# Patient Record
Sex: Male | Born: 1945
Health system: Southern US, Community
[De-identification: ages and names within clinical notes are randomized; demographics above are authoritative.]

## PROBLEM LIST (undated history)

## (undated) DIAGNOSIS — E785 Hyperlipidemia, unspecified: Secondary | ICD-10-CM

## (undated) DIAGNOSIS — C439 Malignant melanoma of skin, unspecified: Secondary | ICD-10-CM

## (undated) DIAGNOSIS — C4491 Basal cell carcinoma of skin, unspecified: Secondary | ICD-10-CM

## (undated) DIAGNOSIS — J449 Chronic obstructive pulmonary disease, unspecified: Secondary | ICD-10-CM

## (undated) DIAGNOSIS — C4492 Squamous cell carcinoma of skin, unspecified: Secondary | ICD-10-CM

## (undated) DIAGNOSIS — I10 Essential (primary) hypertension: Secondary | ICD-10-CM

## (undated) DIAGNOSIS — I219 Acute myocardial infarction, unspecified: Secondary | ICD-10-CM

## (undated) HISTORY — DX: Basal cell carcinoma of skin, unspecified: C44.91

## (undated) HISTORY — DX: Malignant melanoma of skin, unspecified: C43.9

## (undated) HISTORY — DX: Chronic obstructive pulmonary disease, unspecified: J44.9

## (undated) HISTORY — DX: Squamous cell carcinoma of skin, unspecified: C44.92

## (undated) HISTORY — DX: Hyperlipidemia, unspecified: E78.5

## (undated) HISTORY — DX: Essential (primary) hypertension: I10

## (undated) HISTORY — DX: Acute myocardial infarction, unspecified: I21.9

---

## 1980-11-19 HISTORY — PX: MELANOMA EXCISION: SHX5266

## 2009-10-28 ENCOUNTER — Ambulatory Visit: Payer: Self-pay | Admitting: Family Medicine

## 2009-12-15 ENCOUNTER — Ambulatory Visit: Payer: Self-pay | Admitting: Pain Medicine

## 2009-12-29 ENCOUNTER — Ambulatory Visit: Payer: Self-pay | Admitting: Pain Medicine

## 2010-01-12 ENCOUNTER — Ambulatory Visit: Payer: Self-pay | Admitting: Pain Medicine

## 2010-02-02 ENCOUNTER — Ambulatory Visit: Payer: Self-pay | Admitting: Pain Medicine

## 2010-10-30 ENCOUNTER — Ambulatory Visit: Payer: Self-pay | Admitting: Ophthalmology

## 2010-11-27 ENCOUNTER — Ambulatory Visit: Payer: Self-pay | Admitting: Ophthalmology

## 2014-10-19 DIAGNOSIS — I219 Acute myocardial infarction, unspecified: Secondary | ICD-10-CM

## 2014-10-19 HISTORY — PX: CAROTID STENT: SHX1301

## 2014-10-19 HISTORY — DX: Acute myocardial infarction, unspecified: I21.9

## 2014-10-26 DIAGNOSIS — I252 Old myocardial infarction: Secondary | ICD-10-CM | POA: Insufficient documentation

## 2014-11-30 DIAGNOSIS — I2510 Atherosclerotic heart disease of native coronary artery without angina pectoris: Secondary | ICD-10-CM | POA: Insufficient documentation

## 2015-05-06 ENCOUNTER — Encounter: Payer: Self-pay | Admitting: Family Medicine

## 2015-05-06 ENCOUNTER — Other Ambulatory Visit: Payer: Self-pay | Admitting: Family Medicine

## 2015-05-06 ENCOUNTER — Ambulatory Visit (INDEPENDENT_AMBULATORY_CARE_PROVIDER_SITE_OTHER): Payer: Commercial Managed Care - HMO | Admitting: Family Medicine

## 2015-05-06 ENCOUNTER — Ambulatory Visit
Admission: RE | Admit: 2015-05-06 | Discharge: 2015-05-06 | Disposition: A | Discharge status: Home / Self Care | Payer: Commercial Managed Care - HMO | Source: Ambulatory Visit | Attending: Family Medicine | Admitting: Family Medicine

## 2015-05-06 VITALS — BP 104/65 | HR 52 | Temp 98.4°F | Resp 16 | Ht 71.0 in | Wt 205.0 lb

## 2015-05-06 DIAGNOSIS — I2510 Atherosclerotic heart disease of native coronary artery without angina pectoris: Secondary | ICD-10-CM

## 2015-05-06 DIAGNOSIS — J189 Pneumonia, unspecified organism: Secondary | ICD-10-CM | POA: Diagnosis not present

## 2015-05-06 DIAGNOSIS — R0602 Shortness of breath: Secondary | ICD-10-CM

## 2015-05-06 MED ORDER — AZITHROMYCIN 250 MG PO TABS: ORAL_TABLET | ORAL | Status: DC

## 2015-05-06 MED ORDER — ALBUTEROL SULFATE HFA 108 (90 BASE) MCG/ACT IN AERS: 2.0000 | INHALATION_SPRAY | RESPIRATORY_TRACT | Status: DC | PRN

## 2015-05-06 NOTE — Progress Notes (Signed)
Name: George Jensen   MRN: [id number]    DOB: Jan 19, 1946   Date:05/06/2015       Progress Note  Subjective  Chief Complaint  Chief Complaint  Patient presents with  . Breathing Problem    Stopped smoking December. Having SOB and wheezing since.   . Coronary Artery Disease    HPI   Here for SOB.  Question re: COPD.  S/p MI in Dec. Of 2015 with 3 stents placed.  Has sig. DOE and SOB with lyhig flat. No more chest pain.  Has stopped smoking x 6 mos.  Smoked for >50 yrs.  Past Medical History  Diagnosis Date  . Myocardial infarction 10/2014  . COPD (chronic obstructive pulmonary disease)   . Hypertension     Past Surgical History  Procedure Laterality Date  . Carotid stent  10/2014    3 STENTS [facility]    History reviewed. No pertinent family history.  History   Social History  . Marital Status: Married    Spouse Name: N/A  . Number of Children: N/A  . Years of Education: N/A   Occupational History  . Not on file.   Social History Main Topics  . Smoking status: Former Smoker -- 2.00 packs/day    Types: Cigarettes    Quit date: 10/19/2014  . Smokeless tobacco: Never Used  . Alcohol Use: No  . Drug Use: No  . Sexual Activity: Not on file   Other Topics Concern  . Not on file   Social History Narrative  . No narrative on file     Current outpatient prescriptions:  .  aspirin 81 MG EC tablet, , Disp: , Rfl:  .  atorvastatin (LIPITOR) 80 MG tablet, , Disp: , Rfl:  .  BRILINTA 90 MG TABS tablet, , Disp: , Rfl:  .  fluticasone (FLONASE) 50 MCG/ACT nasal spray, Place 2 sprays into both nostrils daily., Disp: , Rfl:  .  lisinopril (PRINIVIL,ZESTRIL) 2.5 MG tablet, , Disp: , Rfl:  .  metoprolol tartrate (LOPRESSOR) 25 MG tablet, , Disp: , Rfl:  .  nitroGLYCERIN (NITROSTAT) 0.4 MG SL tablet, Place 0.4 mg under the tongue every 5 (five) minutes as needed for chest pain. AS NEEDED, Disp: , Rfl:  .  albuterol (PROVENTIL HFA;VENTOLIN HFA) 108 (90 BASE) MCG/ACT  inhaler, Inhale 2 puffs into the lungs every 4 (four) hours as needed for wheezing or shortness of breath (or shortness of breath)., Disp: 1 Inhaler, Rfl: 12  Not on File   Review of Systems  Constitutional: Negative.  Negative for fever, chills and malaise/fatigue.  HENT: Negative.   Eyes: Negative.  Negative for blurred vision and double vision.  Respiratory: Positive for shortness of breath. Negative for cough and wheezing.   Cardiovascular: Positive for orthopnea and PND. Negative for chest pain, palpitations and leg swelling.  Gastrointestinal: Negative.  Negative for nausea, vomiting, abdominal pain and diarrhea.  Genitourinary: Negative.   Musculoskeletal: Negative.  Negative for myalgias and joint pain.  Skin: Negative.  Negative for rash.  Neurological: Negative for weakness and headaches.      Objective  Filed Vitals:   05/06/15 1125  BP: 104/65  Pulse: 52  Temp: 98.4 F (36.9 C)  Resp: 16  Height: 5\' 11"  (1.803 m)  Weight: 205 lb (92.987 kg)  SpO2: 98%    Physical Exam  Constitutional: He is oriented to person, place, and time and well-developed, well-nourished, and in no distress. No distress.  HENT:  Head: Atraumatic.  Eyes: EOM are normal. Pupils are equal, round, and reactive to light. No scleral icterus.  Neck: Normal range of motion. Neck supple. No thyromegaly present.  Cardiovascular: Regular rhythm, normal heart sounds and intact distal pulses.  Bradycardia present.  Exam reveals no gallop and no friction rub.   No murmur heard. Pulmonary/Chest: Effort normal and breath sounds normal. No respiratory distress. He has no wheezes. He exhibits no tenderness.  Abdominal: Soft. Bowel sounds are normal. He exhibits no mass. There is no tenderness.  Musculoskeletal: He exhibits no edema.  Lymphadenopathy:    He has no cervical adenopathy.  Neurological: He is alert and oriented to person, place, and time.  Vitals reviewed.        Assessment &  Plan  Problem List Items Addressed This Visit      Cardiovascular and Mediastinum   Coronary artery disease   Relevant Medications   aspirin 81 MG EC tablet   atorvastatin (LIPITOR) 80 MG tablet   lisinopril (PRINIVIL,ZESTRIL) 2.5 MG tablet   metoprolol tartrate (LOPRESSOR) 25 MG tablet   nitroGLYCERIN (NITROSTAT) 0.4 MG SL tablet     Other   Shortness of breath    Other Visit Diagnoses    SOB (shortness of breath)    -  Primary    Relevant Medications    albuterol (PROVENTIL HFA;VENTOLIN HFA) 108 (90 BASE) MCG/ACT inhaler    Other Relevant Orders    Pulse oximetry (single)    DG Chest 2 View    Ambulatory referral to Pulmonology       Meds ordered this encounter  Medications  . aspirin 81 MG EC tablet    Sig:   . atorvastatin (LIPITOR) 80 MG tablet    Sig:   . lisinopril (PRINIVIL,ZESTRIL) 2.5 MG tablet    Sig:   . metoprolol tartrate (LOPRESSOR) 25 MG tablet    Sig:   . BRILINTA 90 MG TABS tablet    Sig:   . fluticasone (FLONASE) 50 MCG/ACT nasal spray    Sig: Place 2 sprays into both nostrils daily.  . nitroGLYCERIN (NITROSTAT) 0.4 MG SL tablet    Sig: Place 0.4 mg under the tongue every 5 (five) minutes as needed for chest pain. AS NEEDED  . albuterol (PROVENTIL HFA;VENTOLIN HFA) 108 (90 BASE) MCG/ACT inhaler    Sig: Inhale 2 puffs into the lungs every 4 (four) hours as needed for wheezing or shortness of breath (or shortness of breath).    Dispense:  1 Inhaler    Refill:  12   1. SOB (shortness of breath)  - Pulse oximetry (single); Future  Refer to pulmonology.  2. Coronary artery disease involving native coronary artery of native heart without angina pectoris  Continue current meds.

## 2015-05-06 NOTE — Progress Notes (Signed)
Advised.JH  

## 2015-05-09 ENCOUNTER — Telehealth: Payer: Self-pay | Admitting: Family Medicine

## 2015-05-09 NOTE — Telephone Encounter (Signed)
Pt returned call. States she available the rest of the day for a call back.

## 2015-05-09 NOTE — Telephone Encounter (Signed)
LMTCB

## 2015-05-09 NOTE — Telephone Encounter (Signed)
Pt went to work today but his [place] send him home when they found out that he had pneumonia and send him home wife wanted to find out if he can work and advised him not to go to work and if they need work note I will be happy to write one but still can find out with Dr. Luan Pulling but wife understood and will not go to work Thanks [employer]

## 2015-05-09 NOTE — Telephone Encounter (Signed)
Spoke to wife who stated they had additional questions concerning the pt's xray results and his limitations.

## 2015-05-09 NOTE — Telephone Encounter (Signed)
No.  He should not go to work at all this week, until the pneumonia has had a chance to clear.  If breathing is better, he might be able to return to work next Mon.-jh

## 2015-05-20 ENCOUNTER — Ambulatory Visit (INDEPENDENT_AMBULATORY_CARE_PROVIDER_SITE_OTHER): Payer: Commercial Managed Care - HMO | Admitting: Family Medicine

## 2015-05-20 ENCOUNTER — Ambulatory Visit: Payer: Self-pay | Admitting: Family Medicine

## 2015-05-20 ENCOUNTER — Encounter: Payer: Self-pay | Admitting: Family Medicine

## 2015-05-20 VITALS — BP 106/67 | HR 56 | Resp 16 | Ht 71.0 in | Wt 206.0 lb

## 2015-05-20 DIAGNOSIS — J439 Emphysema, unspecified: Secondary | ICD-10-CM | POA: Insufficient documentation

## 2015-05-20 DIAGNOSIS — J189 Pneumonia, unspecified organism: Secondary | ICD-10-CM | POA: Diagnosis not present

## 2015-05-20 DIAGNOSIS — J181 Lobar pneumonia, unspecified organism: Secondary | ICD-10-CM

## 2015-05-20 DIAGNOSIS — J438 Other emphysema: Secondary | ICD-10-CM | POA: Diagnosis not present

## 2015-05-20 NOTE — Patient Instructions (Addendum)
May decreased use of Albuterol inhaler to as needed.  Continue all current meds.

## 2015-05-20 NOTE — Progress Notes (Signed)
Name: George Jensen   MRN: [id number]    DOB: 28-Nov-1945   Date:05/20/2015       Progress Note  Subjective  Chief Complaint  Chief Complaint  Patient presents with  . Breathing Problem    Follow Up    HPI  For follow up of COPD and pneumonia.  Finished Z-pak.  Breathing much better.  Back to normal. Taking all meds.  Using Albuterol inhaler 4 times a day.   Past Medical History  Diagnosis Date  . Myocardial infarction 10/2014  . COPD (chronic obstructive pulmonary disease)   . Hypertension     History  Substance Use Topics  . Smoking status: Former Smoker -- 2.00 packs/day    Types: Cigarettes    Quit date: 10/19/2014  . Smokeless tobacco: Never Used  . Alcohol Use: No     Current outpatient prescriptions:  .  albuterol (PROVENTIL HFA;VENTOLIN HFA) 108 (90 BASE) MCG/ACT inhaler, Inhale 2 puffs into the lungs every 4 (four) hours as needed for wheezing or shortness of breath (or shortness of breath)., Disp: 1 Inhaler, Rfl: 12 .  aspirin 81 MG EC tablet, , Disp: , Rfl:  .  atorvastatin (LIPITOR) 80 MG tablet, , Disp: , Rfl:  .  BRILINTA 90 MG TABS tablet, , Disp: , Rfl:  .  fluticasone (FLONASE) 50 MCG/ACT nasal spray, Place 2 sprays into both nostrils daily., Disp: , Rfl:  .  lisinopril (PRINIVIL,ZESTRIL) 2.5 MG tablet, , Disp: , Rfl:  .  metoprolol tartrate (LOPRESSOR) 25 MG tablet, , Disp: , Rfl:  .  nitroGLYCERIN (NITROSTAT) 0.4 MG SL tablet, Place 0.4 mg under the tongue every 5 (five) minutes as needed for chest pain. AS NEEDED, Disp: , Rfl:  .  azithromycin (ZITHROMAX) 250 MG tablet, Take 2 tabs onmday 1, then 1 tab daily on days 2-5. (Patient not taking: Reported on 05/20/2015), Disp: 6 tablet, Rfl: 0  No Known Allergies  Review of Systems  Constitutional: Negative.  Negative for fever, chills and malaise/fatigue.  HENT: Negative.  Negative for congestion and nosebleeds.   Eyes: Negative.  Negative for blurred vision and double vision.  Respiratory: Positive  for cough and sputum production. Negative for shortness of breath and wheezing.   Cardiovascular: Negative.  Negative for chest pain, palpitations, orthopnea and leg swelling.  Gastrointestinal: Negative for heartburn, nausea, vomiting, abdominal pain and diarrhea.  Genitourinary: Negative.  Negative for dysuria and frequency.  Musculoskeletal: Negative.  Negative for myalgias and joint pain.  Skin: Negative.  Negative for rash.  Neurological: Negative for weakness and headaches.      Objective  Filed Vitals:   05/20/15 0817  BP: 106/67  Pulse: 56  Resp: 16  Height: 5\' 11"  (1.803 m)  Weight: 206 lb (93.441 kg)  SpO2: 100%     Physical Exam  Constitutional: He is well-developed, well-nourished, and in no distress. No distress.  HENT:  Head: Normocephalic and atraumatic.  Left Ear: External ear normal.  Nose: Nose normal.  Mouth/Throat: Oropharynx is clear and moist.  Neck: Normal range of motion. Neck supple. No thyromegaly present.  Cardiovascular: Regular rhythm, normal heart sounds and intact distal pulses.  Bradycardia present.  Exam reveals no gallop.   No murmur heard. Pulmonary/Chest: Effort normal and breath sounds normal. No respiratory distress. He has no wheezes. He has no rales.  Musculoskeletal: He exhibits no edema.  Lymphadenopathy:    He has no cervical adenopathy.  Vitals reviewed.       Assessment &  Plan  1. Other emphysema   2. Right lower lobe pneumonia -get CXR in 2 weeks.

## 2015-06-03 ENCOUNTER — Ambulatory Visit
Admission: RE | Admit: 2015-06-03 | Payer: Commercial Managed Care - HMO | Source: Ambulatory Visit | Admitting: *Deleted

## 2015-07-14 ENCOUNTER — Ambulatory Visit (INDEPENDENT_AMBULATORY_CARE_PROVIDER_SITE_OTHER): Payer: Commercial Managed Care - HMO | Admitting: Family Medicine

## 2015-07-14 ENCOUNTER — Other Ambulatory Visit: Payer: Self-pay | Admitting: *Deleted

## 2015-07-14 ENCOUNTER — Encounter: Payer: Self-pay | Admitting: Family Medicine

## 2015-07-14 VITALS — BP 131/72 | HR 51 | Temp 97.9°F | Resp 16 | Ht 71.0 in | Wt 208.0 lb

## 2015-07-14 DIAGNOSIS — I129 Hypertensive chronic kidney disease with stage 1 through stage 4 chronic kidney disease, or unspecified chronic kidney disease: Secondary | ICD-10-CM | POA: Insufficient documentation

## 2015-07-14 DIAGNOSIS — I10 Essential (primary) hypertension: Secondary | ICD-10-CM | POA: Diagnosis not present

## 2015-07-14 DIAGNOSIS — J431 Panlobular emphysema: Secondary | ICD-10-CM

## 2015-07-14 DIAGNOSIS — R0602 Shortness of breath: Secondary | ICD-10-CM

## 2015-07-14 DIAGNOSIS — I25119 Atherosclerotic heart disease of native coronary artery with unspecified angina pectoris: Secondary | ICD-10-CM

## 2015-07-14 MED ORDER — ALBUTEROL SULFATE HFA 108 (90 BASE) MCG/ACT IN AERS: 2.0000 | INHALATION_SPRAY | RESPIRATORY_TRACT | Status: DC | PRN

## 2015-07-14 MED ORDER — BUDESONIDE-FORMOTEROL FUMARATE 160-4.5 MCG/ACT IN AERO: 2.0000 | INHALATION_SPRAY | Freq: Two times a day (BID) | RESPIRATORY_TRACT | Status: DC

## 2015-07-14 NOTE — Progress Notes (Signed)
Name: George Jensen   MRN: [id number]    DOB: [birth year]   Date:07/14/2015       Progress Note  Subjective  Chief Complaint  Chief Complaint  Patient presents with  . Follow-up    Pt saw Dr. Raul Del and was started on Spiriva 1 inhalation daily.   . Shortness of Breath    HPI  Here for f/u of COPD.  Still using rescue inhaler 2-4 times every day.  Dr. Raul Del put him on Spriva.  Still taking BP meds.  No heart pains No problem-specific assessment & plan notes found for this encounter.   Past Medical History  Diagnosis Date  . Myocardial infarction 10/2014  . COPD (chronic obstructive pulmonary disease)   . Hypertension     Social History  Substance Use Topics  . Smoking status: Former Smoker -- 2.00 packs/day    Types: Cigarettes    Quit date: 10/19/2014  . Smokeless tobacco: Never Used  . Alcohol Use: No     Current outpatient prescriptions:  .  albuterol (PROVENTIL HFA;VENTOLIN HFA) 108 (90 BASE) MCG/ACT inhaler, Inhale 2 puffs into the lungs every 4 (four) hours as needed for wheezing or shortness of breath (or shortness of breath)., Disp: 1 Inhaler, Rfl: 12 .  aspirin 81 MG EC tablet, , Disp: , Rfl:  .  atorvastatin (LIPITOR) 80 MG tablet, , Disp: , Rfl:  .  BRILINTA 90 MG TABS tablet, Take 90 mg by mouth 2 (two) times daily. , Disp: , Rfl:  .  fluticasone (FLONASE) 50 MCG/ACT nasal spray, Place 2 sprays into both nostrils as needed. , Disp: , Rfl:  .  lisinopril (PRINIVIL,ZESTRIL) 2.5 MG tablet, Take 2.5 mg by mouth daily. , Disp: , Rfl:  .  metoprolol tartrate (LOPRESSOR) 25 MG tablet, Take 25 mg by mouth 2 (two) times daily. , Disp: , Rfl:  .  nitroGLYCERIN (NITROSTAT) 0.4 MG SL tablet, Place 0.4 mg under the tongue every 5 (five) minutes as needed for chest pain. AS NEEDED, Disp: , Rfl:  .  tiotropium (SPIRIVA) 18 MCG inhalation capsule, Place 18 mcg into inhaler and inhale daily., Disp: , Rfl:  .  budesonide-formoterol (SYMBICORT) 160-4.5 MCG/ACT inhaler,  Inhale 2 puffs into the lungs 2 (two) times daily., Disp: 1 Inhaler, Rfl: 12  No Known Allergies  Review of Systems  Constitutional: Negative for fever, chills, weight loss and malaise/fatigue.  HENT: Negative for hearing loss.   Eyes: Negative for blurred vision and double vision.  Respiratory: Positive for cough and shortness of breath. Negative for sputum production and wheezing.   Cardiovascular: Negative for chest pain, palpitations, orthopnea and leg swelling.  Gastrointestinal: Negative for heartburn, nausea, vomiting, abdominal pain and diarrhea.  Genitourinary: Negative for dysuria, urgency and frequency.  Musculoskeletal: Negative for myalgias and joint pain.  Skin: Negative for rash.  Neurological: Negative for weakness and headaches.      Objective  Filed Vitals:   07/14/15 0957  BP: 131/72  Pulse: 51  Temp: 97.9 F (36.6 C)  TempSrc: Oral  Resp: 16  Height: 5\' 11"  (1.803 m)  Weight: 208 lb (94.348 kg)     Physical Exam  Constitutional: He is well-developed, well-nourished, and in no distress.  HENT:  Head: Normocephalic and atraumatic.  Eyes: Conjunctivae and EOM are normal. Pupils are equal, round, and reactive to light. No scleral icterus.  Neck: Normal range of motion. Neck supple. Carotid bruit is not present. No thyromegaly present.  Cardiovascular: Normal rate, regular rhythm,  normal heart sounds and intact distal pulses.  Exam reveals no gallop and no friction rub.   No murmur heard. Pulmonary/Chest: Effort normal and breath sounds normal. No respiratory distress. He has no wheezes. He has no rales.  Abdominal: Soft. Bowel sounds are normal.  Musculoskeletal: He exhibits no edema.  Lymphadenopathy:    He has no cervical adenopathy.  Vitals reviewed.     No results found for this or any previous visit (from the past 2160 hour(s)).   Assessment & Plan  1. Panlobular emphysema  - budesonide-formoterol (SYMBICORT) 160-4.5 MCG/ACT inhaler;  Inhale 2 puffs into the lungs 2 (two) times daily.  Dispense: 1 Inhaler; Refill: 12  2. Essential hypertension   3. Coronary artery disease with unspecified angina pectoris

## 2015-07-14 NOTE — Patient Instructions (Signed)
Continue current meds.    Recommend that he he contact Dr. Gust Brooms office for earlier appt. For f/u.

## 2015-07-19 ENCOUNTER — Other Ambulatory Visit: Payer: Self-pay | Admitting: Family Medicine

## 2015-07-19 DIAGNOSIS — R0602 Shortness of breath: Secondary | ICD-10-CM

## 2015-07-19 MED ORDER — ALBUTEROL SULFATE HFA 108 (90 BASE) MCG/ACT IN AERS: 2.0000 | INHALATION_SPRAY | RESPIRATORY_TRACT | Status: AC | PRN

## 2015-07-31 ENCOUNTER — Other Ambulatory Visit: Payer: Self-pay | Admitting: Family Medicine

## 2015-08-12 ENCOUNTER — Other Ambulatory Visit: Payer: Self-pay | Admitting: Family Medicine

## 2015-08-12 ENCOUNTER — Ambulatory Visit: Payer: Commercial Managed Care - HMO

## 2015-08-12 DIAGNOSIS — Z23 Encounter for immunization: Secondary | ICD-10-CM

## 2015-09-16 ENCOUNTER — Telehealth: Payer: Self-pay | Admitting: Family Medicine

## 2015-09-16 ENCOUNTER — Encounter: Payer: Self-pay | Admitting: Family Medicine

## 2015-09-16 ENCOUNTER — Ambulatory Visit (INDEPENDENT_AMBULATORY_CARE_PROVIDER_SITE_OTHER): Payer: Commercial Managed Care - HMO | Admitting: Family Medicine

## 2015-09-16 VITALS — BP 108/68 | HR 64 | Resp 16 | Ht 71.0 in | Wt 208.8 lb

## 2015-09-16 DIAGNOSIS — I25119 Atherosclerotic heart disease of native coronary artery with unspecified angina pectoris: Secondary | ICD-10-CM

## 2015-09-16 DIAGNOSIS — R5382 Chronic fatigue, unspecified: Secondary | ICD-10-CM

## 2015-09-16 DIAGNOSIS — J431 Panlobular emphysema: Secondary | ICD-10-CM | POA: Diagnosis not present

## 2015-09-16 DIAGNOSIS — I10 Essential (primary) hypertension: Secondary | ICD-10-CM

## 2015-09-16 NOTE — Patient Instructions (Signed)
Keep appt. With Dr. Newman Pies (Card) in [place]  In Dec.

## 2015-09-16 NOTE — Progress Notes (Signed)
Name: George Jensen   MRN: [id number]    DOB: [birth year]   Date:09/16/2015       Progress Note  Subjective  Chief Complaint  Chief Complaint  Patient presents with  . COPD  . Hypertension    HPI Here for f/u COPD and HBP.  C/o feeling fatigued all the time.  Had Coronary 12.2015.  Dr. Newman Pies ([place]).  No problem-specific assessment & plan notes found for this encounter.   Past Medical History  Diagnosis Date  . Myocardial infarction ([place]) 10/2014  . COPD (chronic obstructive pulmonary disease) ([place])   . Hypertension     Social History  Substance Use Topics  . Smoking status: Former Smoker -- 2.00 packs/day    Types: Cigarettes    Quit date: 10/19/2014  . Smokeless tobacco: Never Used  . Alcohol Use: No     Current outpatient prescriptions:  .  albuterol (PROVENTIL HFA;VENTOLIN HFA) 108 (90 BASE) MCG/ACT inhaler, Inhale 2 puffs into the lungs every 4 (four) hours as needed for wheezing or shortness of breath (or shortness of breath)., Disp: 3 Inhaler, Rfl: 3 .  aspirin 81 MG EC tablet, TAKE 1 TABLET EVERY DAY, Disp: 100 tablet, Rfl: 3 .  atorvastatin (LIPITOR) 80 MG tablet, , Disp: , Rfl:  .  BRILINTA 90 MG TABS tablet, Take 90 mg by mouth 2 (two) times daily. , Disp: , Rfl:  .  budesonide-formoterol (SYMBICORT) 160-4.5 MCG/ACT inhaler, Inhale 2 puffs into the lungs 2 (two) times daily., Disp: 1 Inhaler, Rfl: 12 .  fluticasone (FLONASE) 50 MCG/ACT nasal spray, Place 2 sprays into both nostrils as needed. , Disp: , Rfl:  .  lisinopril (PRINIVIL,ZESTRIL) 2.5 MG tablet, Take 2.5 mg by mouth daily. , Disp: , Rfl:  .  metoprolol tartrate (LOPRESSOR) 25 MG tablet, Take 25 mg by mouth 2 (two) times daily. , Disp: , Rfl:  .  nitroGLYCERIN (NITROSTAT) 0.4 MG SL tablet, Place 0.4 mg under the tongue every 5 (five) minutes as needed for chest pain. AS NEEDED, Disp: , Rfl:  .  tiotropium (SPIRIVA) 18 MCG inhalation capsule, Place 18 mcg into inhaler and inhale daily., Disp: , Rfl:    No Known Allergies  Review of Systems  Constitutional: Positive for malaise/fatigue. Negative for fever, chills and weight loss.  HENT: Negative for hearing loss.   Eyes: Negative for blurred vision and double vision.  Respiratory: Negative for cough, sputum production, shortness of breath and wheezing.   Cardiovascular: Negative for chest pain, palpitations and leg swelling.  Gastrointestinal: Negative for heartburn, abdominal pain and blood in stool.  Genitourinary: Negative for dysuria, urgency and frequency.  Musculoskeletal: Negative for myalgias and joint pain.  Skin: Negative for rash.  Neurological: Negative for dizziness, tremors, weakness and headaches.      Objective  Filed Vitals:   09/16/15 0940  BP: 108/68  Pulse: 64  Resp: 16  Height: 5\' 11"  (1.803 m)  Weight: 208 lb 12.8 oz (94.711 kg)     Physical Exam  Constitutional: He is oriented to person, place, and time and well-developed, well-nourished, and in no distress. No distress.  HENT:  Head: Normocephalic and atraumatic.  Eyes: Conjunctivae and EOM are normal. Pupils are equal, round, and reactive to light. No scleral icterus.  Neck: Normal range of motion. Neck supple. Carotid bruit is not present. No thyromegaly present.  Cardiovascular: Normal rate, regular rhythm, normal heart sounds and intact distal pulses.  Exam reveals no gallop and no friction rub.  No murmur heard. Pulmonary/Chest: Effort normal and breath sounds normal. No respiratory distress. He has no wheezes. He has no rales.  Abdominal: Soft. Bowel sounds are normal. He exhibits no distension and no abdominal bruit. There is no tenderness. There is no rebound.  Musculoskeletal: He exhibits no edema.  Lymphadenopathy:    He has no cervical adenopathy.  Neurological: He is alert and oriented to person, place, and time.  Vitals reviewed.     No results found for this or any previous visit (from the past 2160 hour(s)).   Assessment  & Plan  1. Essential hypertension -cont. meds - Comprehensive Metabolic Panel (CMET)  2. Panlobular emphysema (HCC) -=cont. meds  3. Coronary artery disease with unspecified angina pectoris -cont. meds - Lipid Profile  4. Chronic fatigue  - CBC with Differential - TSH

## 2015-09-16 NOTE — Telephone Encounter (Signed)
Ok-jh 

## 2015-09-16 NOTE — Telephone Encounter (Signed)
FYI

## 2015-09-16 NOTE — Telephone Encounter (Signed)
Pt's appt with cardio, Dr. Newman Pies is scheduled for May 2017 and then he is on recall.Marland Kitchen

## 2015-09-20 LAB — CBC WITH DIFFERENTIAL/PLATELET
BASOS ABS: 0 10*3/uL (ref 0.0–0.2)
Basos: 0 %
EOS (ABSOLUTE): 0.7 10*3/uL — ABNORMAL HIGH (ref 0.0–0.4)
EOS: 8 %
Hematocrit: 36.6 % — ABNORMAL LOW (ref 37.5–51.0)
Hemoglobin: 12.5 g/dL — ABNORMAL LOW (ref 12.6–17.7)
IMMATURE GRANULOCYTES: 0 %
Immature Grans (Abs): 0 10*3/uL (ref 0.0–0.1)
Lymphocytes Absolute: 1.3 10*3/uL (ref 0.7–3.1)
Lymphs: 15 %
MCH: 30.2 pg (ref 26.6–33.0)
MCHC: 34.2 g/dL (ref 31.5–35.7)
MCV: 88 fL (ref 79–97)
Monocytes Absolute: 1.1 10*3/uL — ABNORMAL HIGH (ref 0.1–0.9)
Monocytes: 13 %
NEUTROS PCT: 64 %
Neutrophils Absolute: 5.5 10*3/uL (ref 1.4–7.0)
PLATELETS: 210 10*3/uL (ref 150–379)
RBC: 4.14 x10E6/uL (ref 4.14–5.80)
RDW: 14 % (ref 12.3–15.4)
WBC: 8.7 10*3/uL (ref 3.4–10.8)

## 2015-09-21 LAB — COMPREHENSIVE METABOLIC PANEL
ALT: 24 IU/L (ref 0–44)
AST: 25 IU/L (ref 0–40)
Albumin/Globulin Ratio: 1.5 (ref 1.1–2.5)
Albumin: 3.9 g/dL (ref 3.6–4.8)
Alkaline Phosphatase: 97 IU/L (ref 39–117)
BUN/Creatinine Ratio: 11 (ref 10–22)
BUN: 19 mg/dL (ref 8–27)
Bilirubin Total: 0.4 mg/dL (ref 0.0–1.2)
CALCIUM: 9.1 mg/dL (ref 8.6–10.2)
CO2: 20 mmol/L (ref 18–29)
CREATININE: 1.7 mg/dL — AB (ref 0.76–1.27)
Chloride: 104 mmol/L (ref 97–106)
GFR calc Af Amer: 48 mL/min/{1.73_m2} — ABNORMAL LOW (ref >59)
GFR, EST NON AFRICAN AMERICAN: 41 mL/min/{1.73_m2} — AB (ref >59)
GLUCOSE: 103 mg/dL — AB (ref 65–99)
Globulin, Total: 2.6 g/dL (ref 1.5–4.5)
Potassium: 5.1 mmol/L (ref 3.5–5.2)
SODIUM: 140 mmol/L (ref 136–144)
Total Protein: 6.5 g/dL (ref 6.0–8.5)

## 2015-09-21 LAB — LIPID PANEL
CHOLESTEROL TOTAL: 132 mg/dL (ref 100–199)
Chol/HDL Ratio: 3.5 ratio units (ref 0.0–5.0)
HDL: 38 mg/dL — AB (ref >39)
LDL Calculated: 77 mg/dL (ref 0–99)
TRIGLYCERIDES: 86 mg/dL (ref 0–149)
VLDL Cholesterol Cal: 17 mg/dL (ref 5–40)

## 2015-09-21 LAB — TSH: TSH: 2.04 u[IU]/mL (ref 0.450–4.500)

## 2015-09-22 LAB — SPECIMEN STATUS REPORT

## 2015-09-22 LAB — HGB A1C W/O EAG: Hgb A1c MFr Bld: 6.3 % — ABNORMAL HIGH (ref 4.8–5.6)

## 2015-12-22 ENCOUNTER — Encounter: Payer: Self-pay | Admitting: Family Medicine

## 2015-12-22 ENCOUNTER — Ambulatory Visit (INDEPENDENT_AMBULATORY_CARE_PROVIDER_SITE_OTHER): Payer: Commercial Managed Care - HMO | Admitting: Family Medicine

## 2015-12-22 VITALS — BP 126/78 | HR 53 | Temp 97.4°F | Resp 16 | Ht 71.0 in | Wt 213.6 lb

## 2015-12-22 DIAGNOSIS — F32A Depression, unspecified: Secondary | ICD-10-CM

## 2015-12-22 DIAGNOSIS — R7303 Prediabetes: Secondary | ICD-10-CM | POA: Diagnosis not present

## 2015-12-22 DIAGNOSIS — J431 Panlobular emphysema: Secondary | ICD-10-CM | POA: Diagnosis not present

## 2015-12-22 DIAGNOSIS — I2510 Atherosclerotic heart disease of native coronary artery without angina pectoris: Secondary | ICD-10-CM | POA: Diagnosis not present

## 2015-12-22 DIAGNOSIS — F329 Major depressive disorder, single episode, unspecified: Secondary | ICD-10-CM | POA: Diagnosis not present

## 2015-12-22 DIAGNOSIS — I10 Essential (primary) hypertension: Secondary | ICD-10-CM | POA: Diagnosis not present

## 2015-12-22 DIAGNOSIS — F325 Major depressive disorder, single episode, in full remission: Secondary | ICD-10-CM | POA: Insufficient documentation

## 2015-12-22 DIAGNOSIS — E1169 Type 2 diabetes mellitus with other specified complication: Secondary | ICD-10-CM | POA: Insufficient documentation

## 2015-12-22 MED ORDER — LOSARTAN POTASSIUM 25 MG PO TABS: 25.0000 mg | ORAL_TABLET | Freq: Every day | ORAL | Status: DC

## 2015-12-22 MED ORDER — SERTRALINE HCL 50 MG PO TABS: 50.0000 mg | ORAL_TABLET | Freq: Every day | ORAL | Status: DC

## 2015-12-22 NOTE — Patient Instructions (Signed)
Check A1c on return

## 2015-12-22 NOTE — Progress Notes (Signed)
Name: George Jensen   MRN: [id number]    DOB: 1946/08/15   Date:12/22/2015       Progress Note  Subjective  Chief Complaint  Chief Complaint  Patient presents with  . Hypertension    HPI Here for f/u of HBP.  Has card disease but is improving.  Sees Card in [facility]).  He is feeling ok physically. But c/o "stress" .  Not sleeping well,  Feels tired, motivation getting, feels more sad and is more agitated, appetite comes and goes.  He has also been found to be pre-diabetic with A1c of 6.3. No problem-specific assessment & plan notes found for this encounter.   Past Medical History  Diagnosis Date  . Myocardial infarction ([place]) 10/2014  . COPD (chronic obstructive pulmonary disease) ([place])   . Hypertension     Past Surgical History  Procedure Laterality Date  . Carotid stent  10/2014    3 ST[facility]    History reviewed. No pertinent family history.  Social History   Social History  . Marital Status: Married    Spouse Name: N/A  . Number of Children: N/A  . Years of Education: N/A   Occupational History  . Not on file.   Social History Main Topics  . Smoking status: Former Smoker -- 2.00 packs/day    Types: Cigarettes    Quit date: 10/19/2014  . Smokeless tobacco: Never Used  . Alcohol Use: No  . Drug Use: No  . Sexual Activity: Not on file   Other Topics Concern  . Not on file   Social History Narrative     Current outpatient prescriptions:  .  albuterol (PROVENTIL HFA;VENTOLIN HFA) 108 (90 BASE) MCG/ACT inhaler, Inhale 2 puffs into the lungs every 4 (four) hours as needed for wheezing or shortness of breath (or shortness of breath)., Disp: 3 Inhaler, Rfl: 3 .  aspirin 81 MG EC tablet, TAKE 1 TABLET EVERY DAY, Disp: 100 tablet, Rfl: 3 .  atorvastatin (LIPITOR) 80 MG tablet, , Disp: , Rfl:  .  budesonide-formoterol (SYMBICORT) 160-4.5 MCG/ACT inhaler, Inhale into the lungs., Disp: , Rfl:  .  metoprolol tartrate (LOPRESSOR) 25 MG tablet, Take  25 mg by mouth 2 (two) times daily. , Disp: , Rfl:  .  nitroGLYCERIN (NITROSTAT) 0.4 MG SL tablet, Place 0.4 mg under the tongue every 5 (five) minutes as needed for chest pain. AS NEEDED, Disp: , Rfl:  .  tiotropium (SPIRIVA) 18 MCG inhalation capsule, Place 18 mcg into inhaler and inhale daily., Disp: , Rfl:  .  losartan (COZAAR) 25 MG tablet, Take 1 tablet (25 mg total) by mouth daily., Disp: 30 tablet, Rfl: 6 .  sertraline (ZOLOFT) 50 MG tablet, Take 1 tablet (50 mg total) by mouth daily., Disp: 30 tablet, Rfl: 3  Not on File   Review of Systems  Constitutional: Negative for fever, chills, weight loss and malaise/fatigue.  HENT: Negative for hearing loss.   Eyes: Negative for blurred vision and double vision.  Respiratory: Positive for cough (dry throaty cough developing). Negative for shortness of breath and wheezing.   Cardiovascular: Negative for chest pain, palpitations and leg swelling.  Gastrointestinal: Negative for heartburn, abdominal pain and blood in stool.  Genitourinary: Negative for dysuria, urgency and frequency.  Skin: Negative for rash.  Neurological: Negative for weakness and headaches.  Psychiatric/Behavioral: Positive for depression. The patient is nervous/anxious and has insomnia.       Objective  Filed Vitals:   12/22/15 [id number]  BP: 126/78  Pulse: 53  Temp: 97.4 F (36.3 C)  Resp: 16  Height: 5\' 11"  (1.803 m)  Weight: 213 lb 9.6 oz (96.888 kg)    Physical Exam  Constitutional: He is oriented to person, place, and time and well-developed, well-nourished, and in no distress. No distress.  HENT:  Head: Normocephalic and atraumatic.  Eyes: Conjunctivae and EOM are normal. Pupils are equal, round, and reactive to light. No scleral icterus.  Neck: Normal range of motion. Neck supple. Carotid bruit is not present. No thyromegaly present.  Cardiovascular: Normal rate, regular rhythm and normal heart sounds.  Exam reveals no gallop and no friction rub.   No  murmur heard. Pulmonary/Chest: Effort normal and breath sounds normal. No respiratory distress. He has no wheezes. He has no rales.  Abdominal: Soft. Bowel sounds are normal. He exhibits no distension, no abdominal bruit and no mass. There is no tenderness.  Musculoskeletal: He exhibits no edema.  Lymphadenopathy:    He has no cervical adenopathy.  Neurological: He is oriented to person, place, and time. Gait normal.  Psychiatric:  Afect is mod. Depressed.  PHQ-9 score of 19.  Vitals reviewed.      No results found for this or any previous visit (from the past 2160 hour(s)).   Assessment & Plan  Problem List Items Addressed This Visit      Cardiovascular and Mediastinum   HBP (high blood pressure) - Primary   Relevant Medications   losartan (COZAAR) 25 MG tablet   CAD, multiple vessel   Relevant Medications   losartan (COZAAR) 25 MG tablet     Respiratory   COPD (chronic obstructive pulmonary disease) (HCC)   Relevant Medications   budesonide-formoterol (SYMBICORT) 160-4.5 MCG/ACT inhaler     Other   Depression   Relevant Medications   sertraline (ZOLOFT) 50 MG tablet      Meds ordered this encounter  Medications  . budesonide-formoterol (SYMBICORT) 160-4.5 MCG/ACT inhaler    Sig: Inhale into the lungs.  Marland Kitchen losartan (COZAAR) 25 MG tablet    Sig: Take 1 tablet (25 mg total) by mouth daily.    Dispense:  30 tablet    Refill:  6  . sertraline (ZOLOFT) 50 MG tablet    Sig: Take 1 tablet (50 mg total) by mouth daily.    Dispense:  30 tablet    Refill:  3   1. Essential hypertension  - losartan (COZAAR) 25 MG tablet; Take 1 tablet (25 mg total) by mouth daily.  Dispense: 30 tablet; Refill: 6 Discontinue Liosinopril.  Cont. Metoprolol 2. CAD, multiple vessel  - losartan (COZAAR) 25 MG tablet; Take 1 tablet (25 mg total) by mouth daily.  Dispense: 30 tablet; Refill: 6  3. Panlobular emphysema ([place])   4. Depression  - sertraline (ZOLOFT) 50 MG tablet; Take 1  tablet (50 mg total) by mouth daily.  Dispense: 30 tablet; Refill: 3

## 2016-02-09 ENCOUNTER — Encounter: Payer: Self-pay | Admitting: Family Medicine

## 2016-02-09 ENCOUNTER — Ambulatory Visit (INDEPENDENT_AMBULATORY_CARE_PROVIDER_SITE_OTHER): Payer: Commercial Managed Care - HMO | Admitting: Family Medicine

## 2016-02-09 VITALS — BP 120/60 | HR 52 | Temp 98.3°F | Resp 16 | Ht 71.0 in | Wt 208.0 lb

## 2016-02-09 DIAGNOSIS — F329 Major depressive disorder, single episode, unspecified: Secondary | ICD-10-CM | POA: Diagnosis not present

## 2016-02-09 DIAGNOSIS — J431 Panlobular emphysema: Secondary | ICD-10-CM | POA: Diagnosis not present

## 2016-02-09 DIAGNOSIS — K219 Gastro-esophageal reflux disease without esophagitis: Secondary | ICD-10-CM | POA: Diagnosis not present

## 2016-02-09 DIAGNOSIS — I2584 Coronary atherosclerosis due to calcified coronary lesion: Secondary | ICD-10-CM

## 2016-02-09 DIAGNOSIS — I10 Essential (primary) hypertension: Secondary | ICD-10-CM

## 2016-02-09 DIAGNOSIS — R7303 Prediabetes: Secondary | ICD-10-CM | POA: Diagnosis not present

## 2016-02-09 DIAGNOSIS — I2510 Atherosclerotic heart disease of native coronary artery without angina pectoris: Secondary | ICD-10-CM | POA: Diagnosis not present

## 2016-02-09 DIAGNOSIS — F32A Depression, unspecified: Secondary | ICD-10-CM

## 2016-02-09 LAB — POCT GLYCOSYLATED HEMOGLOBIN (HGB A1C)

## 2016-02-09 MED ORDER — OMEPRAZOLE 40 MG PO CPDR: 40.0000 mg | DELAYED_RELEASE_CAPSULE | Freq: Every day | ORAL | Status: DC

## 2016-02-09 NOTE — Progress Notes (Signed)
Name: George Jensen   MRN: [id number]    DOB: 24-Feb-1946   Date:02/09/2016       Progress Note  Subjective  Chief Complaint  Chief Complaint  Patient presents with  . Hypertension    HPI Here to f/u HBP and pre-diabetes (elevated BS).  HE has followed with Card. And planned next visit is in 2 months.  Feeling well overall.  His wife feels that he is calmer and less agitated. He has heartburn every day.  No problem-specific assessment & plan notes found for this encounter.   Past Medical History  Diagnosis Date  . Myocardial infarction ([place]) 10/2014  . COPD (chronic obstructive pulmonary disease) ([place])   . Hypertension     Past Surgical History  Procedure Laterality Date  . Carotid stent  10/2014    3 [facility]    History reviewed. No pertinent family history.  Social History   Social History  . Marital Status: Married    Spouse Name: N/A  . Number of Children: N/A  . Years of Education: N/A   Occupational History  . Not on file.   Social History Main Topics  . Smoking status: Former Smoker -- 2.00 packs/day    Types: Cigarettes    Quit date: 10/19/2014  . Smokeless tobacco: Never Used  . Alcohol Use: No  . Drug Use: No  . Sexual Activity: Not on file   Other Topics Concern  . Not on file   Social History Narrative     Current outpatient prescriptions:  .  albuterol (PROVENTIL HFA;VENTOLIN HFA) 108 (90 BASE) MCG/ACT inhaler, Inhale 2 puffs into the lungs every 4 (four) hours as needed for wheezing or shortness of breath (or shortness of breath)., Disp: 3 Inhaler, Rfl: 3 .  aspirin 81 MG EC tablet, TAKE 1 TABLET EVERY DAY, Disp: 100 tablet, Rfl: 3 .  atorvastatin (LIPITOR) 80 MG tablet, , Disp: , Rfl:  .  budesonide-formoterol (SYMBICORT) 160-4.5 MCG/ACT inhaler, Inhale into the lungs., Disp: , Rfl:  .  losartan (COZAAR) 25 MG tablet, Take 1 tablet (25 mg total) by mouth daily., Disp: 30 tablet, Rfl: 6 .  metoprolol tartrate (LOPRESSOR) 25 MG  tablet, Take 25 mg by mouth 2 (two) times daily. , Disp: , Rfl:  .  nitroGLYCERIN (NITROSTAT) 0.4 MG SL tablet, Place 0.4 mg under the tongue every 5 (five) minutes as needed for chest pain. AS NEEDED, Disp: , Rfl:  .  sertraline (ZOLOFT) 50 MG tablet, Take 1 tablet (50 mg total) by mouth daily., Disp: 30 tablet, Rfl: 3 .  tiotropium (SPIRIVA) 18 MCG inhalation capsule, Place 18 mcg into inhaler and inhale daily., Disp: , Rfl:  .  omeprazole (PRILOSEC) 40 MG capsule, Take 1 capsule (40 mg total) by mouth daily., Disp: 30 capsule, Rfl: 6  No Known Allergies   Review of Systems  Constitutional: Negative for fever, chills, weight loss and malaise/fatigue.  HENT: Negative for hearing loss.   Eyes: Negative for blurred vision and double vision.  Respiratory: Negative for cough, shortness of breath and wheezing.   Cardiovascular: Negative for chest pain, palpitations and leg swelling.  Gastrointestinal: Positive for heartburn. Negative for abdominal pain and blood in stool.  Genitourinary: Negative for dysuria, urgency and frequency.  Musculoskeletal: Negative for myalgias and joint pain.  Skin: Negative for rash.  Neurological: Negative for dizziness, tremors, weakness and headaches.  Psychiatric/Behavioral: Negative for depression. The patient is not nervous/anxious.       Objective  Filed Vitals:   02/09/16 0851 02/09/16 0912  BP: 132/79 120/60  Pulse: 52   Temp: 98.3 F (36.8 C)   TempSrc: Oral   Resp: 16   Height: 5\' 11"  (1.803 m)   Weight: 208 lb (94.348 kg)     Physical Exam  Constitutional: He is oriented to person, place, and time and well-developed, well-nourished, and in no distress. No distress.  HENT:  Head: Normocephalic and atraumatic.  Eyes: Conjunctivae and EOM are normal. Pupils are equal, round, and reactive to light. No scleral icterus.  Neck: Normal range of motion. Neck supple. Carotid bruit is not present. No thyromegaly present.  Cardiovascular: Normal  rate, regular rhythm and normal heart sounds.  Exam reveals no gallop and no friction rub.   No murmur heard. Pulmonary/Chest: Effort normal and breath sounds normal. No respiratory distress. He has no wheezes. He has no rales.  Abdominal: Soft. Bowel sounds are normal. He exhibits no distension, no abdominal bruit and no mass. There is no tenderness.  Musculoskeletal: He exhibits no edema.  Lymphadenopathy:    He has no cervical adenopathy.  Neurological: He is alert and oriented to person, place, and time.  Psychiatric: Mood, memory, affect and judgment normal.  Vitals reviewed.      No results found for this or any previous visit (from the past 2160 hour(s)).   Assessment & Plan  Problem List Items Addressed This Visit      Cardiovascular and Mediastinum   Coronary artery disease   HBP (high blood pressure) - Primary     Respiratory   COPD (chronic obstructive pulmonary disease) (HCC)     Digestive   GERD (gastroesophageal reflux disease)   Relevant Medications   omeprazole (PRILOSEC) 40 MG capsule     Other   Depression   Pre-diabetes   Relevant Orders   POCT HgB A1C      Meds ordered this encounter  Medications  . omeprazole (PRILOSEC) 40 MG capsule    Sig: Take 1 capsule (40 mg total) by mouth daily.    Dispense:  30 capsule    Refill:  6   1. Essential hypertension Cont. Losartan   2. Gastroesophageal reflux disease without esophagitis  - omeprazole (PRILOSEC) 40 MG capsule; Take 1 capsule (40 mg total) by mouth daily.  Dispense: 30 capsule; Refill: 6  3. Coronary artery disease due to calcified coronary lesion  Cont. Metoprollol. 4. Panlobular emphysema (HCC) Cont Albuterol prn  5. Depression Cont. Zoloft  6. Pre-diabetes  - POCT HgB A1C-5.9

## 2016-02-09 NOTE — Addendum Note (Signed)
Addended by: Devona Konig on: 02/09/2016 09:22 AM   Modules accepted: Orders

## 2016-04-02 ENCOUNTER — Ambulatory Visit: Payer: Commercial Managed Care - HMO | Admitting: Family Medicine

## 2016-04-06 ENCOUNTER — Other Ambulatory Visit: Payer: Self-pay | Admitting: Family Medicine

## 2016-06-14 ENCOUNTER — Ambulatory Visit: Payer: Commercial Managed Care - HMO | Admitting: Family Medicine

## 2016-06-20 ENCOUNTER — Telehealth: Payer: Self-pay

## 2016-06-20 NOTE — Telephone Encounter (Signed)
Patients wife called to report that George Jensen has stopped Symbicort due to having blurry vision while on med.  Please advise if you want him to change anything.

## 2016-06-21 ENCOUNTER — Telehealth: Payer: Self-pay | Admitting: Family Medicine

## 2016-06-21 MED ORDER — FLUTICASONE-SALMETEROL 250-50 MCG/DOSE IN AEPB
1.0000 | INHALATION_SPRAY | Freq: Two times a day (BID) | RESPIRATORY_TRACT | 12 refills | Status: DC
Start: 2016-06-21 — End: 2016-10-15

## 2016-06-21 NOTE — Addendum Note (Signed)
Addended by: Devona Konig on: 06/21/2016 04:19 PM   Modules accepted: Orders

## 2016-06-21 NOTE — Telephone Encounter (Signed)
rx sent.[place]

## 2016-06-21 NOTE — Telephone Encounter (Signed)
Addressed.[place]

## 2016-06-21 NOTE — Telephone Encounter (Signed)
After restarting inhaler Symbicort today, patient has experienced blurred vision again. You mention changing to Advair?

## 2016-06-21 NOTE — Telephone Encounter (Signed)
[place]  Your call

## 2016-06-21 NOTE — Telephone Encounter (Signed)
Find out how his breathing is.  We could substitute Advair if he would like.  Do not know about coverage.-jh

## 2016-06-21 NOTE — Telephone Encounter (Signed)
Spoke to patient's wife in regards to inhaler. He had only used inhaler once. He did not use it Tues or Wed but did start back on it Thurs. She says his breathing is ok. They will try a couple more days to determine if dizziness is caused by inhaler.

## 2016-06-21 NOTE — Telephone Encounter (Signed)
Try sending in [place], 50/250, 1 puff twice daily (1 disc with 12 refills).-jh

## 2016-06-21 NOTE — Telephone Encounter (Signed)
Ok-jh 

## 2016-06-27 ENCOUNTER — Other Ambulatory Visit: Payer: Self-pay | Admitting: *Deleted

## 2016-06-27 MED ORDER — BUDESONIDE-FORMOTEROL FUMARATE 160-4.5 MCG/ACT IN AERO: 1.0000 | INHALATION_SPRAY | Freq: Two times a day (BID) | RESPIRATORY_TRACT | 0 refills | Status: DC

## 2016-06-27 NOTE — Progress Notes (Unsigned)
Patient's wife called and reported they changed directions on Symbicort to 1 puff am and 1 puff pm. Patient has not had anymore dizziness therefore, they will not p/u advair.

## 2016-06-28 NOTE — Progress Notes (Signed)
Ok-jh 

## 2016-07-04 ENCOUNTER — Other Ambulatory Visit: Payer: Self-pay | Admitting: Family Medicine

## 2016-07-04 DIAGNOSIS — I10 Essential (primary) hypertension: Secondary | ICD-10-CM

## 2016-07-04 DIAGNOSIS — I2510 Atherosclerotic heart disease of native coronary artery without angina pectoris: Secondary | ICD-10-CM

## 2016-08-03 ENCOUNTER — Other Ambulatory Visit: Payer: Self-pay | Admitting: Family Medicine

## 2016-08-03 DIAGNOSIS — F329 Major depressive disorder, single episode, unspecified: Secondary | ICD-10-CM

## 2016-08-03 DIAGNOSIS — F32A Depression, unspecified: Secondary | ICD-10-CM

## 2016-08-06 ENCOUNTER — Telehealth: Payer: Self-pay | Admitting: Family Medicine

## 2016-08-06 NOTE — Telephone Encounter (Signed)
Pt.  Called states that he

## 2016-08-20 ENCOUNTER — Telehealth: Payer: Self-pay | Admitting: Family Medicine

## 2016-08-20 NOTE — Telephone Encounter (Signed)
Pt approval number is [id number] valid from 08/20/16 for 6 visit.

## 2016-08-29 ENCOUNTER — Other Ambulatory Visit: Payer: Self-pay | Admitting: Family Medicine

## 2016-08-29 DIAGNOSIS — K219 Gastro-esophageal reflux disease without esophagitis: Secondary | ICD-10-CM

## 2016-08-30 NOTE — Telephone Encounter (Signed)
Pt. Wife  Called requesting  A  90 day refill on Omeprazole

## 2016-09-06 ENCOUNTER — Ambulatory Visit (INDEPENDENT_AMBULATORY_CARE_PROVIDER_SITE_OTHER): Payer: Commercial Managed Care - HMO

## 2016-09-06 DIAGNOSIS — Z23 Encounter for immunization: Secondary | ICD-10-CM | POA: Diagnosis not present

## 2016-10-10 ENCOUNTER — Other Ambulatory Visit: Payer: Self-pay | Admitting: *Deleted

## 2016-10-10 MED ORDER — BUDESONIDE-FORMOTEROL FUMARATE 160-4.5 MCG/ACT IN AERO: 1.0000 | INHALATION_SPRAY | Freq: Two times a day (BID) | RESPIRATORY_TRACT | 1 refills | Status: DC

## 2016-10-15 ENCOUNTER — Other Ambulatory Visit: Payer: Self-pay | Admitting: Family Medicine

## 2016-10-15 DIAGNOSIS — J431 Panlobular emphysema: Secondary | ICD-10-CM

## 2016-10-22 NOTE — Telephone Encounter (Signed)
error 

## 2016-10-23 ENCOUNTER — Other Ambulatory Visit: Payer: Self-pay | Admitting: Family Medicine

## 2016-10-23 DIAGNOSIS — F329 Major depressive disorder, single episode, unspecified: Secondary | ICD-10-CM

## 2016-10-23 DIAGNOSIS — F32A Depression, unspecified: Secondary | ICD-10-CM

## 2016-10-29 ENCOUNTER — Telehealth: Payer: Self-pay | Admitting: Family Medicine

## 2016-10-29 ENCOUNTER — Other Ambulatory Visit: Payer: Self-pay | Admitting: Family Medicine

## 2016-10-29 DIAGNOSIS — F329 Major depressive disorder, single episode, unspecified: Secondary | ICD-10-CM

## 2016-10-29 DIAGNOSIS — F32A Depression, unspecified: Secondary | ICD-10-CM

## 2016-10-29 MED ORDER — SERTRALINE HCL 50 MG PO TABS: ORAL_TABLET | ORAL | 0 refills | Status: DC

## 2016-10-29 NOTE — Telephone Encounter (Signed)
Blanch Media call to make Dr. Luan Pulling aware that in January 2018 all of pt's medication refills should be sent to [facility] mail order.  His inhaler is the only one that needs to go to [facility].  Her call back number [phone]

## 2016-11-25 ENCOUNTER — Other Ambulatory Visit: Payer: Self-pay | Admitting: Family Medicine

## 2016-11-25 DIAGNOSIS — K219 Gastro-esophageal reflux disease without esophagitis: Secondary | ICD-10-CM

## 2016-11-28 ENCOUNTER — Telehealth: Payer: Self-pay | Admitting: *Deleted

## 2016-11-28 NOTE — Telephone Encounter (Signed)
Received new rx request from [facility] for several medications. Patient needs appointment to get 90 supply on meds. Dr. Luan Pulling will only approve a 30 day supply.

## 2016-12-03 ENCOUNTER — Ambulatory Visit (INDEPENDENT_AMBULATORY_CARE_PROVIDER_SITE_OTHER): Payer: Commercial Managed Care - HMO | Admitting: Family Medicine

## 2016-12-03 ENCOUNTER — Encounter: Payer: Self-pay | Admitting: Family Medicine

## 2016-12-03 VITALS — BP 112/63 | HR 63 | Temp 98.0°F | Resp 16 | Ht 71.0 in | Wt 224.0 lb

## 2016-12-03 DIAGNOSIS — J431 Panlobular emphysema: Secondary | ICD-10-CM

## 2016-12-03 DIAGNOSIS — I10 Essential (primary) hypertension: Secondary | ICD-10-CM | POA: Diagnosis not present

## 2016-12-03 DIAGNOSIS — K219 Gastro-esophageal reflux disease without esophagitis: Secondary | ICD-10-CM | POA: Diagnosis not present

## 2016-12-03 DIAGNOSIS — R7309 Other abnormal glucose: Secondary | ICD-10-CM | POA: Diagnosis not present

## 2016-12-03 DIAGNOSIS — R7303 Prediabetes: Secondary | ICD-10-CM | POA: Diagnosis not present

## 2016-12-03 DIAGNOSIS — I2510 Atherosclerotic heart disease of native coronary artery without angina pectoris: Secondary | ICD-10-CM | POA: Diagnosis not present

## 2016-12-03 DIAGNOSIS — I2584 Coronary atherosclerosis due to calcified coronary lesion: Secondary | ICD-10-CM | POA: Diagnosis not present

## 2016-12-03 LAB — POCT GLYCOSYLATED HEMOGLOBIN (HGB A1C)

## 2016-12-03 NOTE — Progress Notes (Signed)
Name: George Jensen   MRN: [id number]    DOB: [birth year]   Date:12/03/2016       Progress Note  Subjective  Chief Complaint  Chief Complaint  Patient presents with  . Hypertension    HPI Here for f/u of HBP and pre-diabetes.  He also has COPD and uses inhalers and sees Dr. Raul Del.  Sees Dr Newman Pies for Cardiology. HE is taking all meds.  Omeprazole works well for GERD.  No problem-specific Assessment & Plan notes found for this encounter.   Past Medical History:  Diagnosis Date  . COPD (chronic obstructive pulmonary disease) ([place])   . Hypertension   . Myocardial infarction 10/2014    Past Surgical History:  Procedure Laterality Date  . CAROTID STENT  10/2014   3 ST[facility]    History reviewed. No pertinent family history.  Social History   Social History  . Marital status: Married    Spouse name: N/A  . Number of children: N/A  . Years of education: N/A   Occupational History  . Not on file.   Social History Main Topics  . Smoking status: Former Smoker    Packs/day: 2.00    Types: Cigarettes    Quit date: 10/19/2014  . Smokeless tobacco: Never Used  . Alcohol use No  . Drug use: No  . Sexual activity: Not on file   Other Topics Concern  . Not on file   Social History Narrative  . No narrative on file     Current Outpatient Prescriptions:  .  aspirin 81 MG EC tablet, TAKE 1 TABLET EVERY DAY, Disp: 100 tablet, Rfl: 3 .  atorvastatin (LIPITOR) 80 MG tablet, , Disp: , Rfl:  .  budesonide-formoterol (SYMBICORT) 160-4.5 MCG/ACT inhaler, Inhale 2 puffs twice daily, Disp: 1 Inhaler, Rfl: 7 .  losartan (COZAAR) 25 MG tablet, TAKE 1 TABLET (25 MG TOTAL) BY MOUTH DAILY., Disp: 30 tablet, Rfl: 6 .  metoprolol tartrate (LOPRESSOR) 25 MG tablet, Take 25 mg by mouth 2 (two) times daily. , Disp: , Rfl:  .  nitroGLYCERIN (NITROSTAT) 0.4 MG SL tablet, Place 0.4 mg under the tongue every 5 (five) minutes as needed for chest pain. AS NEEDED, Disp: , Rfl:  .   omeprazole (PRILOSEC) 40 MG capsule, TAKE 1 CAPSULE (40 MG TOTAL) BY MOUTH DAILY., Disp: 90 capsule, Rfl: 3 .  sertraline (ZOLOFT) 50 MG tablet, TAKE 1 TABLET (50 MG TOTAL) BY MOUTH DAILY., Disp: 30 tablet, Rfl: 0 .  albuterol (PROVENTIL HFA;VENTOLIN HFA) 108 (90 BASE) MCG/ACT inhaler, Inhale 2 puffs into the lungs every 4 (four) hours as needed for wheezing or shortness of breath (or shortness of breath). (Patient not taking: Reported on 12/03/2016), Disp: 3 Inhaler, Rfl: 3 .  tiotropium (SPIRIVA) 18 MCG inhalation capsule, Place 18 mcg into inhaler and inhale daily., Disp: , Rfl:   Not on File   Review of Systems  Constitutional: Negative for chills, fever, malaise/fatigue and weight loss.  HENT: Negative for hearing loss and tinnitus.   Eyes: Negative for blurred vision and double vision.  Respiratory: Negative for cough, hemoptysis, shortness of breath and wheezing.   Cardiovascular: Negative for chest pain, palpitations and leg swelling.  Gastrointestinal: Negative for abdominal pain, blood in stool and heartburn.  Genitourinary: Negative for dysuria, frequency and urgency.  Musculoskeletal: Negative for back pain and myalgias.  Skin: Negative for rash.  Neurological: Negative for dizziness, tingling, tremors, weakness and headaches.      Objective  Vitals:  12/03/16 1526  BP: 112/63  Pulse: 63  Resp: 16  Temp: 98 F (36.7 C)  TempSrc: Oral  Weight: 224 lb (101.6 kg)  Height: 5\' 11"  (1.803 m)    Physical Exam  Constitutional: He is oriented to person, place, and time and well-developed, well-nourished, and in no distress. No distress.  HENT:  Head: Normocephalic and atraumatic.  Eyes: Conjunctivae and EOM are normal. Pupils are equal, round, and reactive to light. No scleral icterus.  Neck: Normal range of motion. Neck supple. Carotid bruit is not present. No thyromegaly present.  Cardiovascular: Normal rate, regular rhythm and normal heart sounds.  Exam reveals no  gallop and no friction rub.   No murmur heard. Pulmonary/Chest: Effort normal and breath sounds normal. No respiratory distress. He has no wheezes. He has no rales.  Abdominal: Soft. Bowel sounds are normal. He exhibits no distension and no mass. There is no tenderness.  Musculoskeletal: He exhibits no edema.  Lymphadenopathy:    He has no cervical adenopathy.  Neurological: He is alert and oriented to person, place, and time.       Recent Results (from the past 2160 hour(s))  POCT HgB A1C     Status: Abnormal   Collection Time: 12/03/16  3:55 PM  Result Value Ref Range   Hemoglobin A1C 6.0%      Assessment & Plan  Problem List Items Addressed This Visit      Cardiovascular and Mediastinum   Coronary artery disease   HBP (high blood pressure)     Respiratory   Pulmonary emphysema (HCC)     Digestive   GERD (gastroesophageal reflux disease)     Other   Pre-diabetes    Other Visit Diagnoses    Elevated glucose    -  Primary   Relevant Orders   POCT HgB A1C (Completed)      No orders of the defined types were placed in this encounter.  1. Elevated glucose  - POCT HgB A1C-6.0  2. Essential hypertension Refilled Metoprolol and Losartan  3. Coronary artery disease due to calcified coronary lesion   4. Panlobular emphysema (HCC) cont inhalers  5. Gastroesophageal reflux disease without esophagitis Cont Omeprazole  6. Pre-diabetes Discussed diet and exercise again

## 2016-12-22 ENCOUNTER — Other Ambulatory Visit: Payer: Self-pay | Admitting: Family Medicine

## 2016-12-22 DIAGNOSIS — F329 Major depressive disorder, single episode, unspecified: Secondary | ICD-10-CM

## 2016-12-22 DIAGNOSIS — F32A Depression, unspecified: Secondary | ICD-10-CM

## 2016-12-22 DIAGNOSIS — I2510 Atherosclerotic heart disease of native coronary artery without angina pectoris: Secondary | ICD-10-CM

## 2016-12-22 DIAGNOSIS — K219 Gastro-esophageal reflux disease without esophagitis: Secondary | ICD-10-CM

## 2016-12-22 DIAGNOSIS — I10 Essential (primary) hypertension: Secondary | ICD-10-CM

## 2016-12-24 MED ORDER — LOSARTAN POTASSIUM 25 MG PO TABS: 25.0000 mg | ORAL_TABLET | Freq: Every day | ORAL | 3 refills | Status: DC

## 2016-12-24 MED ORDER — OMEPRAZOLE 40 MG PO CPDR: 40.0000 mg | DELAYED_RELEASE_CAPSULE | Freq: Every day | ORAL | 3 refills | Status: DC

## 2016-12-24 MED ORDER — SERTRALINE HCL 50 MG PO TABS: ORAL_TABLET | ORAL | 3 refills | Status: DC

## 2017-02-01 DIAGNOSIS — E7800 Pure hypercholesterolemia, unspecified: Secondary | ICD-10-CM | POA: Diagnosis not present

## 2017-02-08 ENCOUNTER — Telehealth: Payer: Self-pay

## 2017-02-08 DIAGNOSIS — E785 Hyperlipidemia, unspecified: Secondary | ICD-10-CM | POA: Diagnosis not present

## 2017-02-08 DIAGNOSIS — I2510 Atherosclerotic heart disease of native coronary artery without angina pectoris: Secondary | ICD-10-CM | POA: Diagnosis not present

## 2017-02-08 DIAGNOSIS — I252 Old myocardial infarction: Secondary | ICD-10-CM | POA: Diagnosis not present

## 2017-02-08 NOTE — Telephone Encounter (Signed)
error 

## 2017-03-12 ENCOUNTER — Ambulatory Visit: Payer: Commercial Managed Care - HMO | Admitting: Family Medicine

## 2017-03-12 ENCOUNTER — Encounter: Payer: Self-pay | Admitting: Family Medicine

## 2017-03-12 ENCOUNTER — Encounter (INDEPENDENT_AMBULATORY_CARE_PROVIDER_SITE_OTHER): Payer: Self-pay

## 2017-03-12 ENCOUNTER — Ambulatory Visit (INDEPENDENT_AMBULATORY_CARE_PROVIDER_SITE_OTHER): Payer: Medicare PPO | Admitting: Family Medicine

## 2017-03-12 VITALS — BP 128/58 | HR 50 | Temp 98.4°F | Resp 16 | Ht 71.0 in | Wt 222.4 lb

## 2017-03-12 DIAGNOSIS — N1832 Chronic kidney disease, stage 3b: Secondary | ICD-10-CM | POA: Insufficient documentation

## 2017-03-12 DIAGNOSIS — I2510 Atherosclerotic heart disease of native coronary artery without angina pectoris: Secondary | ICD-10-CM | POA: Diagnosis not present

## 2017-03-12 DIAGNOSIS — I10 Essential (primary) hypertension: Secondary | ICD-10-CM | POA: Diagnosis not present

## 2017-03-12 DIAGNOSIS — N183 Chronic kidney disease, stage 3 unspecified: Secondary | ICD-10-CM | POA: Insufficient documentation

## 2017-03-12 DIAGNOSIS — E785 Hyperlipidemia, unspecified: Secondary | ICD-10-CM | POA: Diagnosis not present

## 2017-03-12 DIAGNOSIS — R7303 Prediabetes: Secondary | ICD-10-CM

## 2017-03-12 LAB — POCT GLYCOSYLATED HEMOGLOBIN (HGB A1C): Hemoglobin A1C: 6.1

## 2017-03-12 NOTE — Assessment & Plan Note (Signed)
Well controlled BP Complication with CKD-III, also in setting of Pre-DM  Plan: Continue current regimen Monitor outside office

## 2017-03-12 NOTE — Assessment & Plan Note (Signed)
Stable CKD-III with elevated Cr baseline 1.5 to 1.7, last check had improved, GFR remains in 40s. Likely secondary to HTN, Pre-DM, Age  Plan: 1. Control BP, A1c 2. Improve hydration 3. Avoid NSAIDs 4. Check Chemistry follow Cr, in future may need Nephrology if gradual worsening

## 2017-03-12 NOTE — Assessment & Plan Note (Signed)
Stable, improved lipids on high intensity statin Followed by [facility] (Dr Newman Pies and Dr Rudi Heap) With LDL goal < 70, last checked at 103, now requesting re-check after on Zetia Check fasting lipid today, will fax to [facility]

## 2017-03-12 NOTE — Assessment & Plan Note (Addendum)
Well controlled Pre-DM, A1c today 6.1, has been stable 5.9 to 6.3 Improved diet, but still not fully adhering to DM diet, and limited exercise Not on medications Complication CKD-III, with HTN  Plan: 1. Encouraged improve DM diet - handout given reviewed low carb recommendations, also start regular exercise, counseling provided 2. Check chemistry Cr trend, if improved Cr can consider low dose metformin in future, otherwise may need other med such as GLP1 if wt gain problem and A1c rises 3. Follow-up 3 months Pre-DM A1c

## 2017-03-12 NOTE — Progress Notes (Signed)
Subjective:    Patient ID: George Jensen, male    DOB: 1946-04-16, 71 y.o.   MRN: [id number]  George Jensen is a 71 y.o. male presenting on 03/12/2017 for Hypertension (3 month)   HPI   Pre-Diabetes: Reports no concerns, has limited recall on diagnosis of Pre-Diabetes, but does remember being told to eat low carb diet CBGs: Does not check CBG Meds: No medication Currently on ARB Lifestyle: Diet (tries to follow DM diet with low carb diet, but admits this is difficult to adhere to) / Exercise (limited work during winter, admits to sedentary lifestyle) - Weight up +14 lbs in 1 year - Taking ASA 81mg , Statin Denies hypoglycemia, polyuria, visual changes, numbness or tingling.  CHRONIC HTN: Reports no concerns. No longer checking BP regularly. Current Meds - Metoprolol 25mg  BID, Losartan 25mg    Reports good compliance, took meds today. Tolerating well, w/o complaints. Denies CP, dyspnea, HA, edema, dizziness / lightheadedness  CKD-III Prior Cr lab results from 1.7 down to 1.5, with reduced GFR 40s. He is aware of chronic kidney problem. Not followed by Nephrology.  DYSLIPIDEMIA: - Last lipid panel 2018, prior low HDL, had LDL 103, which is improved but still above goal < 70. He was recently started on Zetia 10mg  daily within past 1 month by Cardiology. Now cardiology requesting recheck Lipids today to see if addition of Zetia is working - Taking ASA 81mg , Lipitor 80mg  - History of MI with STEMI, reportedly due to blood clot, in 10/2014, found CAD with blockages with stent x 3, followed by Cardiology yearly (Dr [facility] in [place]). He was initially treated with DAPT with Brillinta for about 1.5 years, then discontinued by Cardiology since doing well, he is now on monotherapy ASA 81mg   Social History  Substance Use Topics  . Smoking status: Former Smoker    Packs/day: 2.00    Types: Cigarettes    Quit date: 10/19/2014  . Smokeless tobacco: Never Used  . Alcohol  use No    Review of Systems Per HPI unless specifically indicated above     Objective:    BP (!) 128/58   Pulse (!) 50   Temp 98.4 F (36.9 C) (Oral)   Resp 16   Ht 5\' 11"  (1.803 m)   Wt 222 lb 6.4 oz (100.9 kg)   BMI 31.02 kg/m   Wt Readings from Last 3 Encounters:  03/12/17 222 lb 6.4 oz (100.9 kg)  12/03/16 224 lb (101.6 kg)  02/09/16 208 lb (94.3 kg)    Physical Exam  Constitutional: He is oriented to person, place, and time. He appears well-developed and well-nourished. No distress.  Well-appearing, comfortable, cooperative  HENT:  Head: Normocephalic and atraumatic.  Mouth/Throat: Oropharynx is clear and moist.  Eyes: Conjunctivae are normal.  Neck: Normal range of motion. Neck supple. No thyromegaly present.  No carotid bruits  Cardiovascular: Normal rate, regular rhythm, normal heart sounds and intact distal pulses.   No murmur heard. Pulmonary/Chest: Effort normal and breath sounds normal. No respiratory distress. He has no wheezes. He has no rales.  Musculoskeletal: He exhibits no edema.  Lymphadenopathy:    He has no cervical adenopathy.  Neurological: He is alert and oriented to person, place, and time.  Skin: Skin is warm and dry. No rash noted. He is not diaphoretic. No erythema.  Psychiatric: He has a normal mood and affect. His behavior is normal.  Well groomed, good eye contact, normal speech and thoughts  Nursing note and  vitals reviewed.   Results for orders placed or performed in visit on 03/12/17 (from the past 24 hour(s))  POCT HgB A1C     Status: Abnormal   Collection Time: 03/12/17  9:30 AM  Result Value Ref Range   Hemoglobin A1C 6.1        Assessment & Plan:   Problem List Items Addressed This Visit    Pre-diabetes - Primary    Well controlled Pre-DM, A1c today 6.1, has been stable 5.9 to 6.3 Improved diet, but still not fully adhering to DM diet, and limited exercise Not on medications Complication CKD-III, with HTN  Plan: 1.  Encouraged improve DM diet - handout given reviewed low carb recommendations, also start regular exercise, counseling provided 2. Check chemistry Cr trend, if improved Cr can consider low dose metformin in future, otherwise may need other med such as GLP1 if wt gain problem and A1c rises 3. Follow-up 3 months Pre-DM A1c      Relevant Orders   POCT HgB A1C   Essential hypertension    Well controlled BP Complication with CKD-III, also in setting of Pre-DM  Plan: Continue current regimen Monitor outside office      Relevant Medications   ezetimibe (ZETIA) 10 MG tablet   Other Relevant Orders   COMPLETE METABOLIC PANEL WITH GFR   Dyslipidemia    Stable, improved lipids on high intensity statin Followed by [facility]iology (Dr Newman Pies and Dr Rudi Heap) With LDL goal < 70, last checked at 103, now requesting re-check after on Zetia Check fasting lipid today, will fax to [facility]      Relevant Medications   ezetimibe (ZETIA) 10 MG tablet   Other Relevant Orders   Lipid panel   CKD (chronic kidney disease), stage III    Stable CKD-III with elevated Cr baseline 1.5 to 1.7, last check had improved, GFR remains in 40s. Likely secondary to HTN, Pre-DM, Age  Plan: 1. Control BP, A1c 2. Improve hydration 3. Avoid NSAIDs 4. Check Chemistry follow Cr, in future may need Nephrology if gradual worsening      Relevant Orders   COMPLETE METABOLIC PANEL WITH GFR   CAD, multiple vessel    Stable, medically managed, s/p RCA STEMI and PCI Followed by [facility]iology (Dr Newman Pies and Dr Rudi Heap) On ASA, high intensity statin, ARB, BB With LDL goal < 70, last checked at 103, now requesting re-check after on Zetia Check fasting lipid today, will fax to [facility]      Relevant Medications   ezetimibe (ZETIA) 10 MG tablet   Other Relevant Orders   Lipid panel      Meds ordered this encounter  Medications  . ezetimibe (ZETIA) 10 MG tablet    Sig: Take by mouth.      Follow up  plan: Return in about 3 months (around 06/11/2017) for Pre-Diabetes A1c, HTN, HLD.  [name], [facility] 03/12/2017, 10:53 PM

## 2017-03-12 NOTE — Assessment & Plan Note (Signed)
Stable, medically managed, s/p RCA STEMI and PCI Followed by [facility] Cardiology (Dr Newman Pies and Dr Rudi Heap) On ASA, high intensity statin, ARB, BB With LDL goal < 70, last checked at 103, now requesting re-check after on Zetia Check fasting lipid today, will fax to [facility]

## 2017-03-12 NOTE — Patient Instructions (Signed)
Thank you for coming in to clinic today.  1. Pre-Diabetes - A1c 6.1, this is stable, but I am concerned about your lifestyle, and think that this would be better controlled with some regular exercise and working on improving low carb low sugar diet  Let me know if interested in speaking with a Nutritionist who specializing in Diabetes  -----------------------------------------------------------------  Eat at least 3 meals and 1-2 snacks per day (don't skip breakfast).  Aim for no more than 5 hours between eating. - Tip: If you go >5 hours without eating and become very hungry, your body will supply it's own resources temporarily and you can gain extra weight when you eat.  The 5 Minute Rule of Exercise - Promise yourself to at least do 5 minutes of exercise (make sure you time it), and if at the end of 5 minutes (this is the hardest part of the work-out), if you still feel like you want stop (or not motivated to continue) then allow yourself to stop. Otherwise, more often than not you will feel encouraged that you can continue for a little while longer or even more!  Diet Recommendations for Pre-Diabetes   Reduce Starchy (carb) foods include: Bread, rice, pasta, potatoes, corn, crackers, bagels, muffins, all baked goods.   Protein foods include: Meat, fish, poultry, eggs, dairy foods, and beans such as pinto and kidney beans (beans also provide carbohydrate).   1. Eat at least 3 meals and 1-2 snacks per day. Never go more than 4-5 hours while awake without eating.   2. Limit starchy foods to TWO per meal and ONE per snack. ONE portion of a starchy  food is equal to the following:   - ONE slice of bread (or its equivalent, such as half of a hamburger bun).   - 1/2 cup of a "scoopable" starchy food such as potatoes or rice.   - 1 OUNCE (28 grams) of starchy snacks (crackers or pretzels, look on label).   - 15 grams of carbohydrate as shown on food label.   3. Both lunch and dinner should  include a protein food, a carb food, and vegetables.   - Obtain twice as many veg's as protein or carbohydrate foods for both lunch and dinner.   - Try to keep frozen veg's on hand for a quick vegetable serving.     - Fresh or frozen veg's are best.   4. Breakfast should always include protein.    -------------------------------------------------------------------------------  We will check fasting blood work today - will notify you with results. Will fax to Dr Newman Pies  - If kidney function is improved, we can consider Metformin for pre-diabetes in future  Please schedule a follow-up appointment with Dr. Parks Ranger in 3 months Pre-Diabetes A1c, HTN, HLD  If you have any other questions or concerns, please feel free to call the clinic or send a message through [place]. You may also schedule an earlier appointment if necessary.  [name], DO Portal

## 2017-03-13 LAB — COMPLETE METABOLIC PANEL WITH GFR
ALT: 19 U/L (ref 9–46)
AST: 26 U/L (ref 10–35)
Albumin: 3.8 g/dL (ref 3.6–5.1)
Alkaline Phosphatase: 74 U/L (ref 40–115)
BUN: 20 mg/dL (ref 7–25)
CALCIUM: 9.1 mg/dL (ref 8.6–10.3)
CHLORIDE: 110 mmol/L (ref 98–110)
CO2: 24 mmol/L (ref 20–31)
CREATININE: 1.74 mg/dL — AB (ref 0.70–1.25)
GFR, Est African American: 46 mL/min — ABNORMAL LOW (ref >=60)
GFR, Est Non African American: 39 mL/min — ABNORMAL LOW (ref >=60)
Glucose, Bld: 91 mg/dL (ref 65–99)
POTASSIUM: 5 mmol/L (ref 3.5–5.3)
Sodium: 143 mmol/L (ref 135–146)
Total Bilirubin: 0.6 mg/dL (ref 0.2–1.2)
Total Protein: 6.2 g/dL (ref 6.1–8.1)

## 2017-03-13 LAB — LIPID PANEL
CHOL/HDL RATIO: 3.3 ratio (ref <5.0)
CHOLESTEROL: 138 mg/dL (ref <200)
HDL: 42 mg/dL (ref >40)
LDL Cholesterol: 77 mg/dL (ref <100)
TRIGLYCERIDES: 94 mg/dL (ref <150)
VLDL: 19 mg/dL (ref <30)

## 2017-04-23 ENCOUNTER — Telehealth: Payer: Self-pay | Admitting: Family Medicine

## 2017-04-23 NOTE — Telephone Encounter (Signed)
Called pt to schedule Annual Wellness Visit with Nurse Health Advisor for July:  - knb

## 2017-05-06 ENCOUNTER — Other Ambulatory Visit: Payer: Self-pay | Admitting: Pharmacist

## 2017-05-06 NOTE — Patient Outreach (Signed)
Outreach call to George Jensen to complete medication review for [facility] quality metric. Left a HIPAA compliant message on the patient's voicemail. If have not heard from patient by next week, will give him another call at that time.  Harlow Asa, PharmD, [place] [facility] [phone]

## 2017-05-13 ENCOUNTER — Other Ambulatory Visit: Payer: Self-pay | Admitting: Pharmacist

## 2017-05-13 NOTE — Patient Outreach (Signed)
Outreach call to George Jensen to complete medication review for [facility] quality metric. Outreach attempt #2. Left a HIPAA compliant message on the patient's voicemail. If have not heard from patient by 05/15/17, will give him another call at that time.  Harlow Asa, PharmD, [place] [facility] [phone]

## 2017-05-15 ENCOUNTER — Other Ambulatory Visit: Payer: Self-pay | Admitting: Pharmacist

## 2017-05-15 NOTE — Patient Outreach (Signed)
Outreach call to George Jensen to complete medication review for [name] quality metric. Outreach attempt #3. Left a HIPAA compliant message on the patient's voicemail. Unable to reach patient. Have made 3 attempts to reach patient by phone. Will close pharmacy episode at this time.  Harlow Asa, PharmD, [place] [facility] [phone]

## 2017-06-11 ENCOUNTER — Ambulatory Visit (INDEPENDENT_AMBULATORY_CARE_PROVIDER_SITE_OTHER): Payer: Medicare PPO

## 2017-06-11 VITALS — BP 120/72 | HR 62 | Temp 97.5°F | Resp 16 | Ht 68.5 in | Wt 221.0 lb

## 2017-06-11 DIAGNOSIS — Z0000 Encounter for general adult medical examination without abnormal findings: Secondary | ICD-10-CM | POA: Diagnosis not present

## 2017-06-11 NOTE — Progress Notes (Signed)
Subjective:   George Jensen is a 71 y.o. male who presents for Medicare Annual/Subsequent preventive examination.  Review of Systems:   Cardiac Risk Factors include: male gender;advanced age (>41men, >6 women);hypertension;obesity (BMI >30kg/m2);smoking/ tobacco exposure;family history of premature cardiovascular disease     Objective:    Vitals: BP 120/72 (BP Location: Left Arm, Patient Position: Sitting)   Pulse 62   Temp (!) 97.5 F (36.4 C)   Resp 16   Ht 5' 8.5" (1.74 m)   Wt 221 lb (100.2 kg)   BMI 33.11 kg/m   Body mass index is 33.11 kg/m.  Tobacco History  Smoking Status  . Former Smoker  . Packs/day: 2.00  . Types: Cigarettes  . Quit date: 10/19/2014  Smokeless Tobacco  . Never Used     Counseling given: Not Answered   Past Medical History:  Diagnosis Date  . COPD (chronic obstructive pulmonary disease) ([place])   . Hyperlipidemia   . Hypertension   . Melanoma ([place])   . Myocardial infarction [facility]) 10/2014   Past Surgical History:  Procedure Laterality Date  . CAROTID STENT  10/2014   3 STENTS [facility]  . MELANOMA EXCISION  1982   Family History  Problem Relation Age of Onset  . Alzheimer's disease Mother   . Diabetes Father   . Heart disease Father    History  Sexual Activity  . Sexual activity: Not on file    Outpatient Encounter Prescriptions as of 06/11/2017  Medication Sig  . albuterol (PROVENTIL HFA;VENTOLIN HFA) 108 (90 BASE) MCG/ACT inhaler Inhale 2 puffs into the lungs every 4 (four) hours as needed for wheezing or shortness of breath (or shortness of breath).  Marland Kitchen aspirin 81 MG EC tablet TAKE 1 TABLET EVERY DAY  . atorvastatin (LIPITOR) 80 MG tablet   . budesonide-formoterol (SYMBICORT) 160-4.5 MCG/ACT inhaler Inhale 2 puffs twice daily  . ezetimibe (ZETIA) 10 MG tablet Take by mouth.  . losartan (COZAAR) 25 MG tablet Take 1 tablet (25 mg total) by mouth daily.  . metoprolol tartrate (LOPRESSOR) 25 MG tablet Take 25 mg by mouth 2  (two) times daily.   . nitroGLYCERIN (NITROSTAT) 0.4 MG SL tablet Place 0.4 mg under the tongue every 5 (five) minutes as needed for chest pain. AS NEEDED  . omeprazole (PRILOSEC) 40 MG capsule Take 1 capsule (40 mg total) by mouth daily.  . sertraline (ZOLOFT) 50 MG tablet TAKE 1 TABLET (50 MG TOTAL) BY MOUTH DAILY.  Marland Kitchen tiotropium (SPIRIVA) 18 MCG inhalation capsule Place 18 mcg into inhaler and inhale daily.   No facility-administered encounter medications on file as of 06/11/2017.     Activities of Daily Living In your present state of health, do you have any difficulty performing the following activities: 06/11/2017 03/12/2017  Hearing? Tempie Donning  Vision? N N  Difficulty concentrating or making decisions? N N  Walking or climbing stairs? Y N  Dressing or bathing? N N  Doing errands, shopping? N N  Preparing Food and eating ? N -  Using the Toilet? N -  In the past six months, have you accidently leaked urine? N -  Do you have problems with loss of bowel control? N -  Managing your Medications? N -  Managing your Finances? N -  Housekeeping or managing your Housekeeping? N -  Some recent data might be hidden    Patient Care Team: Olin Hauser, DO as PCP - General (Family Medicine) Erby Pian, MD as Referring  Physician (Specialist) Shela Nevin, MD as Referring Physician (Cardiology)   Assessment:     Exercise Activities and Dietary recommendations Current Exercise Habits: The patient has a physically strenous job, but has no regular exercise apart from work., Exercise limited by: None identified  Goals    . Increase water intake          Recommend drinking at least 5-6 glasses of water a day      Fall Risk Fall Risk  06/11/2017 03/12/2017 12/22/2015 09/16/2015 05/20/2015  Falls in the past year? No No No No No   Depression Screen PHQ 2/9 Scores 06/11/2017 03/12/2017 12/22/2015 09/16/2015  PHQ - 2 Score 0 0 0 0    Cognitive Function     6CIT Screen  06/11/2017  What Year? 0 points  What month? 0 points  What time? 0 points  Count back from 20 0 points  Months in reverse 0 points  Repeat phrase 0 points  Total Score 0    Immunization History  Administered Date(s) Administered  . Influenza, High Dose Seasonal PF 08/12/2015, 09/06/2016  . Pneumococcal Conjugate-13 08/12/2015, 09/06/2016   Screening Tests Health Maintenance  Topic Date Due  . Hepatitis C Screening  03/12/2018 (Originally Apr 02, 1946)  . INFLUENZA VACCINE  06/19/2017  . PNA vac Low Risk Adult (2 of 2 - PPSV23) 09/06/2017  . COLONOSCOPY  11/19/2021  . TETANUS/TDAP  09/15/2025      Plan:    I have personally reviewed and addressed the Medicare Annual Wellness questionnaire and have noted the following in the patient's chart:  A. Medical and social history B. Use of alcohol, tobacco or illicit drugs  C. Current medications and supplements D. Functional ability and status E.  Nutritional status F.  Physical activity G. Advance directives H. List of other physicians I.  Hospitalizations, surgeries, and ER visits in previous 12 months J.  [place] such as hearing and vision if needed, cognitive and depression L. Referrals and appointments  In addition, I have reviewed and discussed with patient certain preventive protocols, quality metrics, and best practice recommendations. A written personalized care plan for preventive services as well as general preventive health recommendations were provided to patient.   Signed,  Tyler Aas, LPN Nurse Health Advisor   MD Recommendations: none

## 2017-06-11 NOTE — Patient Instructions (Addendum)
[name] , Thank you for taking time to come for your Medicare Wellness Visit. I appreciate your ongoing commitment to your health goals. Please review the following plan we discussed and let me know if I can assist you in the future.   Screening recommendations/referrals: Colonoscopy: completed 11/20/2011 Recommended yearly ophthalmology/optometry visit for glaucoma screening and checkup Recommended yearly dental visit for hygiene and checkup  Vaccinations: Influenza vaccine: up to date, due 08/2017 Pneumococcal vaccine: up to date  Tdap vaccine: due, check with your insurance company for coverage Shingles vaccine: declined  Advanced directives: Advance directive discussed with you today. I have provided a copy for you to complete at home and have notarized. Once this is complete please bring a copy in to our office so we can scan it into your chart.  Conditions/risks identified: Recommend to continue drinking at least 5-6 glasses of water a day  Next appointment: Follow up on 06/14/2017 at 8:20am with Dr.Karamalegos. Follow up in one year for your annual wellness exam.   Preventive Care 69 Years and Older, Male Preventive care refers to lifestyle choices and visits with your health care provider that can promote health and wellness. What does preventive care include?  A yearly physical exam. This is also called an annual well check.  Dental exams once or twice a year.  Routine eye exams. Ask your health care provider how often you should have your eyes checked.  Personal lifestyle choices, including:  Daily care of your teeth and gums.  Regular physical activity.  Eating a healthy diet.  Avoiding tobacco and drug use.  Limiting alcohol use.  Practicing safe sex.  Taking low doses of aspirin every day.  Taking vitamin and mineral supplements as recommended by your health care provider. What happens during an annual well check? The services and screenings done by your  health care provider during your annual well check will depend on your age, overall health, lifestyle risk factors, and family history of disease. Counseling  Your health care provider may ask you questions about your:  Alcohol use.  Tobacco use.  Drug use.  Emotional well-being.  Home and relationship well-being.  Sexual activity.  Eating habits.  History of falls.  Memory and ability to understand (cognition).  Work and work Statistician. Screening  You may have the following tests or measurements:  Height, weight, and BMI.  Blood pressure.  Lipid and cholesterol levels. These may be checked every 5 years, or more frequently if you are over 45 years old.  Skin check.  Lung cancer screening. You may have this screening every year starting at age 40 if you have a 30-pack-year history of smoking and currently smoke or have quit within the past 15 years.  Fecal occult blood test (FOBT) of the stool. You may have this test every year starting at age 69.  Flexible sigmoidoscopy or colonoscopy. You may have a sigmoidoscopy every 5 years or a colonoscopy every 10 years starting at age 54.  Prostate cancer screening. Recommendations will vary depending on your family history and other risks.  Hepatitis C blood test.  Hepatitis B blood test.  Sexually transmitted disease (STD) testing.  Diabetes screening. This is done by checking your blood sugar (glucose) after you have not eaten for a while (fasting). You may have this done every 1-3 years.  Abdominal aortic aneurysm (AAA) screening. You may need this if you are a current or former smoker.  Osteoporosis. You may be screened starting at age 20 if  you are at high risk. Talk with your health care provider about your test results, treatment options, and if necessary, the need for more tests. Vaccines  Your health care provider may recommend certain vaccines, such as:  Influenza vaccine. This is recommended every  year.  Tetanus, diphtheria, and acellular pertussis (Tdap, Td) vaccine. You may need a Td booster every 10 years.  Zoster vaccine. You may need this after age 38.  Pneumococcal 13-valent conjugate (PCV13) vaccine. One dose is recommended after age 72.  Pneumococcal polysaccharide (PPSV23) vaccine. One dose is recommended after age 87. Talk to your health care provider about which screenings and vaccines you need and how often you need them. This information is not intended to replace advice given to you by your health care provider. Make sure you discuss any questions you have with your health care provider. Document Released: 12/02/2015 Document Revised: 07/25/2016 Document Reviewed: 09/06/2015 Elsevier Interactive Patient Education  2017 [employer] Prevention in the Home Falls can cause injuries. They can happen to people of all ages. There are many things you can do to make your home safe and to help prevent falls. What can I do on the outside of my home?  Regularly fix the edges of walkways and driveways and fix any cracks.  Remove anything that might make you trip as you walk through a door, such as a raised step or threshold.  Trim any bushes or trees on the path to your home.  Use bright outdoor lighting.  Clear any walking paths of anything that might make someone trip, such as rocks or tools.  Regularly check to see if handrails are loose or broken. Make sure that both sides of any steps have handrails.  Any raised decks and porches should have guardrails on the edges.  Have any leaves, snow, or ice cleared regularly.  Use sand or salt on walking paths during winter.  Clean up any spills in your garage right away. This includes oil or grease spills. What can I do in the bathroom?  Use night lights.  Install grab bars by the toilet and in the tub and shower. Do not use towel bars as grab bars.  Use non-skid mats or decals in the tub or shower.  If you  need to sit down in the shower, use a plastic, non-slip stool.  Keep the floor dry. Clean up any water that spills on the floor as soon as it happens.  Remove soap buildup in the tub or shower regularly.  Attach bath mats securely with double-sided non-slip rug tape.  Do not have throw rugs and other things on the floor that can make you trip. What can I do in the bedroom?  Use night lights.  Make sure that you have a light by your bed that is easy to reach.  Do not use any sheets or blankets that are too big for your bed. They should not hang down onto the floor.  Have a firm chair that has side arms. You can use this for support while you get dressed.  Do not have throw rugs and other things on the floor that can make you trip. What can I do in the kitchen?  Clean up any spills right away.  Avoid walking on wet floors.  Keep items that you use a lot in easy-to-reach places.  If you need to reach something above you, use a strong step stool that has a grab bar.  Keep electrical cords  out of the way.  Do not use floor polish or wax that makes floors slippery. If you must use wax, use non-skid floor wax.  Do not have throw rugs and other things on the floor that can make you trip. What can I do with my stairs?  Do not leave any items on the stairs.  Make sure that there are handrails on both sides of the stairs and use them. Fix handrails that are broken or loose. Make sure that handrails are as long as the stairways.  Check any carpeting to make sure that it is firmly attached to the stairs. Fix any carpet that is loose or worn.  Avoid having throw rugs at the top or bottom of the stairs. If you do have throw rugs, attach them to the floor with carpet tape.  Make sure that you have a light switch at the top of the stairs and the bottom of the stairs. If you do not have them, ask someone to add them for you. What else can I do to help prevent falls?  Wear shoes  that:  Do not have high heels.  Have rubber bottoms.  Are comfortable and fit you well.  Are closed at the toe. Do not wear sandals.  If you use a stepladder:  Make sure that it is fully opened. Do not climb a closed stepladder.  Make sure that both sides of the stepladder are locked into place.  Ask someone to hold it for you, if possible.  Clearly mark and make sure that you can see:  Any grab bars or handrails.  First and last steps.  Where the edge of each step is.  Use tools that help you move around (mobility aids) if they are needed. These include:  Canes.  Walkers.  Scooters.  Crutches.  Turn on the lights when you go into a dark area. Replace any light bulbs as soon as they burn out.  Set up your furniture so you have a clear path. Avoid moving your furniture around.  If any of your floors are uneven, fix them.  If there are any pets around you, be aware of where they are.  Review your medicines with your doctor. Some medicines can make you feel dizzy. This can increase your chance of falling. Ask your doctor what other things that you can do to help prevent falls. This information is not intended to replace advice given to you by your health care provider. Make sure you discuss any questions you have with your health care provider. Document Released: 09/01/2009 Document Revised: 04/12/2016 Document Reviewed: 12/10/2014 Elsevier Interactive Patient Education  2017 [employer].

## 2017-06-14 ENCOUNTER — Encounter: Payer: Self-pay | Admitting: Family Medicine

## 2017-06-14 ENCOUNTER — Ambulatory Visit (INDEPENDENT_AMBULATORY_CARE_PROVIDER_SITE_OTHER): Payer: Medicare PPO | Admitting: Family Medicine

## 2017-06-14 VITALS — BP 130/67 | HR 49 | Temp 97.8°F | Resp 16 | Ht 71.0 in | Wt 222.4 lb

## 2017-06-14 DIAGNOSIS — R7303 Prediabetes: Secondary | ICD-10-CM | POA: Diagnosis not present

## 2017-06-14 DIAGNOSIS — I10 Essential (primary) hypertension: Secondary | ICD-10-CM | POA: Diagnosis not present

## 2017-06-14 DIAGNOSIS — N183 Chronic kidney disease, stage 3 unspecified: Secondary | ICD-10-CM

## 2017-06-14 LAB — POCT GLYCOSYLATED HEMOGLOBIN (HGB A1C): Hemoglobin A1C: 6.3 — AB (ref ?–5.7)

## 2017-06-14 NOTE — Assessment & Plan Note (Signed)
Well-controlled HTN, initial 135 SBP manual repeat down to 130 - Home BP readings none  Complication with CKD-III close to progression CKD-IV   Plan:  1. Continue current BP regimen - Metoprolol 25mg  BID, Losartan 25mg  daily (reviewed concern with ARB agree to continue for now) 2. Encourage improved lifestyle - low sodium diet, regular exercise 3. Start monitor BP outside office, bring readings to next visit, if persistently >140/90 or new symptoms notify office sooner 4. Follow-up 3 months - lab bmet

## 2017-06-14 NOTE — Patient Instructions (Addendum)
Thank you for coming to the clinic today.  1. Discussed Chronic Kidney Disease Stage III - at risk for progression to stage IV - Continue to improve hydration, control BP, try to check BP at home once a week or twice a month - Try to stay active - Reduce sugary food intake as discussed  DUE for NON FASTING BLOOD WORK (no food or drink after midnight before the lab appointment, only water or coffee without cream/sugar on the morning of)  SCHEDULE "Lab Only" visit in the morning at the clinic for lab draw in 3 MONTHS   - Make sure Lab Only appointment is at about 1 week before your next appointment, so that results will be available  For Lab Results, once available within 2-3 days of blood draw, you can can log in to MyChart online to view your results and a brief explanation. Also, we can discuss results at next follow-up visit.  Please schedule a Follow-up Appointment to: Return in about 3 months (around 09/14/2017) for Pre-DM, CKD-III, HTN.  If you have any other questions or concerns, please feel free to call the clinic or send a message through [facility]. You may also schedule an earlier appointment if necessary.  Additionally, you may be receiving a survey about your experience at our clinic within a few days to 1 week by e-mail or mail. We value your feedback.  [name], DO [place]

## 2017-06-14 NOTE — Assessment & Plan Note (Signed)
Stable CKD-III with concern for at risk progression to CKD-IV, in setting of HTN, PreDM, age 71 and former history of ibuprofen use but no longer on NSAID >1 year - Cr baseline 1.7, GFR 39-40s  Plan: 1. Control BP, A1c - emphasized improve diet for PreDM to avoid progression to DM, limited DM meds safe with CKD 2. Continue to improve hydration 3. Avoid NSAIDs 4. Follow-up 3 months for repeat BMET Cr trend - discussed that regardless of result will plan to refer to Nephrology [facility] for evaluation and may need additional work-up, goal to prevent progression of CKD. Question about if continue low dose ARB, I recommend it may still be helping reduce risk of progression CKD unless Cr continues to rise on it, then I would continue it, looking for input from Nephrology on this issue

## 2017-06-14 NOTE — Progress Notes (Signed)
Subjective:    Patient ID: George Jensen, male    DOB: [birth year], 71 y.o.   MRN: [id number]  George Jensen is a 71 y.o. male presenting on 06/14/2017 for Hypertension and prediabetes   HPI   Pre-Diabetes: His wife reports that he has been eating more sweets, see below. CBGs: Does not check CBG Meds: No medication (never on med) Currently on ARB Lifestyle: - Diet (Regular diet unchanged, but has been eating more sweets with candy bars over past 3 months, drinks plenty of water >4 glasses daily) - Exercise (working everyday outdoors, and walking regularly, was more sedentary in winter) - Weight stable no gain/loss in 3 months - Taking ASA 81mg , Statin Denies hypoglycemia, polyuria, visual changes, numbness or tingling.  CHRONIC HTN: Reports no concerns. No longer checking BP regularly at home, but he can start. Followed by Cardiology. Current Meds - Metoprolol 25mg  BID, Losartan 25mg    Reports good compliance, took meds today. Tolerating well, w/o complaints. Denies CP, dyspnea, HA, edema, dizziness / lightheadedness  CKD-III - Known history of CKD-III with elevated Cr >1.5 to 1.7 for past >2 years, his GFR has been in range 39 to 40s. He has never seen Nephrology. Discussed this last visit and he is aware of some decline in kidney function. - Reviewed last lab 02/2017 with Cr 1.74 still abnormal, GFR 39 last visit - He has no new concerns with kidneys today, asking about what he can do to help kidneys - He drinks >4 glasses water daily - History of taking ibuprofen regularly for 3-4 years previously, now he has been off of all NSAIDs for >1 year - Denies reduced urine output, flank pain or abdominal pain, confusion, lower extremity edema   Depression screen [facility] 2/9 06/14/2017 06/11/2017 03/12/2017  Decreased Interest 0 0 0  Down, Depressed, Hopeless 0 0 0  PHQ - 2 Score 0 0 0  Altered sleeping 0 - -  Tired, decreased energy 0 - -  Change in appetite 0 - -  Feeling bad or  failure about yourself  0 - -  Trouble concentrating 0 - -  Moving slowly or fidgety/restless 0 - -  Suicidal thoughts 0 - -  PHQ-9 Score 0 - -     Social History  Substance Use Topics  . Smoking status: Former Smoker    Packs/day: 2.00    Types: Cigarettes    Quit date: 10/19/2014  . Smokeless tobacco: Never Used  . Alcohol use No    Review of Systems Per HPI unless specifically indicated above     Objective:    BP 130/67 (BP Location: Right Arm, Cuff Size: Normal)   Pulse (!) 49   Temp 97.8 F (36.6 C) (Oral)   Resp 16   Ht 5\' 11"  (1.803 m)   Wt 222 lb 6.4 oz (100.9 kg)   BMI 31.02 kg/m   Wt Readings from Last 3 Encounters:  06/14/17 222 lb 6.4 oz (100.9 kg)  06/11/17 221 lb (100.2 kg)  03/12/17 222 lb 6.4 oz (100.9 kg)    Physical Exam  Constitutional: He is oriented to person, place, and time. He appears well-developed and well-nourished. No distress.  Well-appearing, comfortable, cooperative  HENT:  Head: Normocephalic and atraumatic.  Mouth/Throat: Oropharynx is clear and moist.  Eyes: Conjunctivae are normal.  Neck: Normal range of motion. Neck supple. No thyromegaly present.  Cardiovascular: Normal rate, regular rhythm, normal heart sounds and intact distal pulses.   No murmur heard. Pulmonary/Chest:  Effort normal and breath sounds normal. No respiratory distress. He has no wheezes. He has no rales.  Musculoskeletal: He exhibits no edema.  Lymphadenopathy:    He has no cervical adenopathy.  Neurological: He is alert and oriented to person, place, and time.  Skin: Skin is warm and dry. No rash noted. He is not diaphoretic. No erythema.  Psychiatric: He has a normal mood and affect. His behavior is normal.  Well groomed, good eye contact, normal speech and thoughts  Nursing note and vitals reviewed.   Results for orders placed or performed in visit on 06/14/17 (from the past 24 hour(s))  POCT HgB A1C     Status: Abnormal   Collection Time: 06/14/17   8:32 AM  Result Value Ref Range   Hemoglobin A1C 6.3 (A) 5.7    Recent Labs  12/03/16 1555 03/12/17 0930 06/14/17 0832  HGBA1C 6.0% 6.1 6.3*       Assessment & Plan:   Problem List Items Addressed This Visit    Pre-diabetes - Primary    Mildly worsening control Pre-DM with A1c 6.3 (from prior 6.1 to 5.9) - attributed due to eating more sweets Concern with HTN, CKD-III  Plan:  1. Not on any therapy currently - remain off - concern with kidney function 2. Encourage improved lifestyle - low carb, low sugar diet, reduce portion size, continue improving regular exercise 3. Follow-up 3 months A1c lab with chemistry      Relevant Orders   POCT HgB A1C (Completed)   Hemoglobin A1c   Essential hypertension    Well-controlled HTN, initial 135 SBP manual repeat down to 130 - Home BP readings none  Complication with CKD-III close to progression CKD-IV   Plan:  1. Continue current BP regimen - Metoprolol 25mg  BID, Losartan 25mg  daily (reviewed concern with ARB agree to continue for now) 2. Encourage improved lifestyle - low sodium diet, regular exercise 3. Start monitor BP outside office, bring readings to next visit, if persistently >140/90 or new symptoms notify office sooner 4. Follow-up 3 months - lab bmet      Relevant Orders   BASIC METABOLIC PANEL WITH GFR   CKD (chronic kidney disease), stage III    Stable CKD-III with concern for at risk progression to CKD-IV, in setting of HTN, PreDM, age 43 and former history of ibuprofen use but no longer on NSAID >1 year - Cr baseline 1.7, GFR 39-40s  Plan: 1. Control BP, A1c - emphasized improve diet for PreDM to avoid progression to DM, limited DM meds safe with CKD 2. Continue to improve hydration 3. Avoid NSAIDs 4. Follow-up 3 months for repeat BMET Cr trend - discussed that regardless of result will plan to refer to Nephrology [facility] for evaluation and may need additional work-up, goal to prevent progression of CKD. Question  about if continue low dose ARB, I recommend it may still be helping reduce risk of progression CKD unless Cr continues to rise on it, then I would continue it, looking for input from Nephrology on this issue      Relevant Orders   BASIC METABOLIC PANEL WITH GFR      No orders of the defined types were placed in this encounter.   Follow up plan: Return in about 3 months (around 09/14/2017) for Pre-DM, CKD-III, HTN.  [name], [facility] 06/14/2017, 9:02 AM

## 2017-06-14 NOTE — Assessment & Plan Note (Signed)
Mildly worsening control Pre-DM with A1c 6.3 (from prior 6.1 to 5.9) - attributed due to eating more sweets Concern with HTN, CKD-III  Plan:  1. Not on any therapy currently - remain off - concern with kidney function 2. Encourage improved lifestyle - low carb, low sugar diet, reduce portion size, continue improving regular exercise 3. Follow-up 3 months A1c lab with chemistry

## 2017-09-18 ENCOUNTER — Other Ambulatory Visit: Payer: Self-pay | Admitting: Family Medicine

## 2017-09-18 DIAGNOSIS — I10 Essential (primary) hypertension: Secondary | ICD-10-CM

## 2017-09-18 DIAGNOSIS — I2510 Atherosclerotic heart disease of native coronary artery without angina pectoris: Secondary | ICD-10-CM

## 2017-09-18 DIAGNOSIS — F32A Depression, unspecified: Secondary | ICD-10-CM

## 2017-09-18 DIAGNOSIS — F329 Major depressive disorder, single episode, unspecified: Secondary | ICD-10-CM

## 2017-09-18 MED ORDER — LOSARTAN POTASSIUM 25 MG PO TABS: 25.0000 mg | ORAL_TABLET | Freq: Every day | ORAL | 3 refills | Status: DC

## 2017-09-18 MED ORDER — SERTRALINE HCL 50 MG PO TABS: ORAL_TABLET | ORAL | 3 refills | Status: DC

## 2017-09-19 ENCOUNTER — Other Ambulatory Visit: Payer: Self-pay

## 2017-09-19 DIAGNOSIS — N183 Chronic kidney disease, stage 3 unspecified: Secondary | ICD-10-CM

## 2017-09-19 DIAGNOSIS — I10 Essential (primary) hypertension: Secondary | ICD-10-CM

## 2017-09-19 DIAGNOSIS — R7303 Prediabetes: Secondary | ICD-10-CM

## 2017-09-20 ENCOUNTER — Other Ambulatory Visit: Payer: Self-pay | Admitting: Family Medicine

## 2017-09-20 ENCOUNTER — Other Ambulatory Visit (INDEPENDENT_AMBULATORY_CARE_PROVIDER_SITE_OTHER): Payer: Medicare PPO

## 2017-09-20 DIAGNOSIS — F32A Depression, unspecified: Secondary | ICD-10-CM

## 2017-09-20 DIAGNOSIS — K219 Gastro-esophageal reflux disease without esophagitis: Secondary | ICD-10-CM

## 2017-09-20 DIAGNOSIS — I2510 Atherosclerotic heart disease of native coronary artery without angina pectoris: Secondary | ICD-10-CM

## 2017-09-20 DIAGNOSIS — Z23 Encounter for immunization: Secondary | ICD-10-CM | POA: Diagnosis not present

## 2017-09-20 DIAGNOSIS — I10 Essential (primary) hypertension: Secondary | ICD-10-CM

## 2017-09-20 DIAGNOSIS — F329 Major depressive disorder, single episode, unspecified: Secondary | ICD-10-CM

## 2017-09-20 MED ORDER — OMEPRAZOLE 40 MG PO CPDR: 40.0000 mg | DELAYED_RELEASE_CAPSULE | Freq: Every day | ORAL | 3 refills | Status: DC

## 2017-09-20 MED ORDER — LOSARTAN POTASSIUM 25 MG PO TABS: 25.0000 mg | ORAL_TABLET | Freq: Every day | ORAL | 3 refills | Status: DC

## 2017-09-20 MED ORDER — SERTRALINE HCL 50 MG PO TABS: ORAL_TABLET | ORAL | 3 refills | Status: DC

## 2017-09-21 LAB — HEMOGLOBIN A1C
Hgb A1c MFr Bld: 5.9 % of total Hgb — ABNORMAL HIGH (ref <5.7)
MEAN PLASMA GLUCOSE: 123 (calc)
eAG (mmol/L): 6.8 (calc)

## 2017-09-21 LAB — BASIC METABOLIC PANEL WITH GFR
BUN/Creatinine Ratio: 10 (calc) (ref 6–22)
BUN: 18 mg/dL (ref 7–25)
CO2: 26 mmol/L (ref 20–32)
Calcium: 8.9 mg/dL (ref 8.6–10.3)
Chloride: 110 mmol/L (ref 98–110)
Creat: 1.77 mg/dL — ABNORMAL HIGH (ref 0.70–1.25)
GFR, EST AFRICAN AMERICAN: 45 mL/min/{1.73_m2} — AB (ref >=60)
GFR, EST NON AFRICAN AMERICAN: 39 mL/min/{1.73_m2} — AB (ref >=60)
Glucose, Bld: 101 mg/dL — ABNORMAL HIGH (ref 65–99)
POTASSIUM: 5 mmol/L (ref 3.5–5.3)
SODIUM: 142 mmol/L (ref 135–146)

## 2017-09-27 ENCOUNTER — Other Ambulatory Visit: Payer: Self-pay | Admitting: Family Medicine

## 2017-09-27 ENCOUNTER — Ambulatory Visit: Payer: Medicare PPO | Admitting: Family Medicine

## 2017-09-27 ENCOUNTER — Encounter: Payer: Self-pay | Admitting: Family Medicine

## 2017-09-27 VITALS — BP 110/60 | HR 51 | Temp 98.0°F | Resp 16 | Ht 71.0 in | Wt 225.6 lb

## 2017-09-27 DIAGNOSIS — N183 Chronic kidney disease, stage 3 unspecified: Secondary | ICD-10-CM

## 2017-09-27 DIAGNOSIS — J431 Panlobular emphysema: Secondary | ICD-10-CM | POA: Diagnosis not present

## 2017-09-27 DIAGNOSIS — I10 Essential (primary) hypertension: Secondary | ICD-10-CM | POA: Diagnosis not present

## 2017-09-27 DIAGNOSIS — R7303 Prediabetes: Secondary | ICD-10-CM | POA: Diagnosis not present

## 2017-09-27 DIAGNOSIS — E785 Hyperlipidemia, unspecified: Secondary | ICD-10-CM

## 2017-09-27 DIAGNOSIS — I2510 Atherosclerotic heart disease of native coronary artery without angina pectoris: Secondary | ICD-10-CM

## 2017-09-27 DIAGNOSIS — Z23 Encounter for immunization: Secondary | ICD-10-CM

## 2017-09-27 NOTE — Progress Notes (Signed)
Subjective:    Patient ID: George Jensen, male    DOB: [birth year], 71 y.o.   MRN: [id number]  George Jensen is a 71 y.o. male presenting on 09/27/2017 for Hypertension   HPI  Pre-Diabetes: Improved diet since last visit. CBGs: Does not check CBG Meds: No medication (never on med) Currently on ARB Lifestyle: - Diet (Improved diet reduced carbs and sweets, drinking more water) - Exercise (working everyday outdoors, and walking regularly) - Taking ASA 81mg , Statin Denies hypoglycemia, polyuria, visual changes, numbness or tingling.  CHRONIC HTN: Reports no concerns. Home BP readings are ranging from 110-130 / 60-70s Current Meds - Metoprolol 25mg  BID, Losartan 25mg  Reports good compliance, took meds today. Tolerating well, w/o complaints. Denies CP, dyspnea, HA, edema, dizziness / lightheadedness  CKD-III - Known history of CKD-III with elevated Cr >1.5 to 1.7 for past >2 years, his GFR has been in range 39 to 40s. He has never seen Nephrology. - Last visit with me 06/14/17, for same problem, see prior notes for background information. - Interval update with recent labs BMET show Cr 1.77, similar to prior 1.70 to 1.74 - Today patient reports no new concerns. He is doing well remaining off NSAIDs, admits to prior history of chronic NSAIDs for years in past. - Stays well hydrated with water >4 glasses daily - Denies reduced urine output, flank pain or abdominal pain, confusion, lower extremity edema  COPD, Former Smoker - Followed by Dr Minerva Areola Pulm, next apt is in 2 weeks on 11/21, he continues to follow-up with Pulmonology once yearly now, and has been doing well - No new complaints - He is currently taking Spiriva, Symbicort, and Albuterol PRN. He will be due for Symbicort refill in about 1 month, old rx in prior PCP name - Denies dyspnea, cough  Health Maintenance: - Due for 2nd pneumonia vaccine >1 year after previous vaccine (Prevnar-13 08/2016), now to receive  Pneumovax-23 today. However reviewed chart and it appears he has received TWO doses of Prevnar-13, initial in 2016. - UTD Flu Shot 09/20/17  Depression screen [facility] 2/9 09/27/2017 06/14/2017 06/11/2017  Decreased Interest 0 0 0  Down, Depressed, Hopeless 0 0 0  PHQ - 2 Score 0 0 0  Altered sleeping 0 0 -  Tired, decreased energy 0 0 -  Change in appetite 0 0 -  Feeling bad or failure about yourself  0 0 -  Trouble concentrating 0 0 -  Moving slowly or fidgety/restless 0 0 -  Suicidal thoughts 0 0 -  PHQ-9 Score 0 0 -  Difficult doing work/chores Not difficult at all - -    Social History   Tobacco Use  . Smoking status: Former Smoker    Packs/day: 2.00    Types: Cigarettes    Last attempt to quit: 10/19/2014    Years since quitting: 2.9  . Smokeless tobacco: Never Used  Substance Use Topics  . Alcohol use: No  . Drug use: No    Review of Systems Per HPI unless specifically indicated above     Objective:    BP 110/60   Pulse (!) 51   Temp 98 F (36.7 C) (Oral)   Resp 16   Ht 5\' 11"  (1.803 m)   Wt 225 lb 9.6 oz (102.3 kg)   BMI 31.46 kg/m   Wt Readings from Last 3 Encounters:  09/27/17 225 lb 9.6 oz (102.3 kg)  06/14/17 222 lb 6.4 oz (100.9 kg)  06/11/17 221 lb (100.2  kg)    Physical Exam  Constitutional: He is oriented to person, place, and time. He appears well-developed and well-nourished. No distress.  Well-appearing, comfortable, cooperative  HENT:  Head: Normocephalic and atraumatic.  Mouth/Throat: Oropharynx is clear and moist.  Eyes: Conjunctivae are normal. Right eye exhibits no discharge. Left eye exhibits no discharge.  Neck: Normal range of motion. Neck supple.  Cardiovascular: Normal rate, regular rhythm, normal heart sounds and intact distal pulses.  No murmur heard. Pulmonary/Chest: Effort normal and breath sounds normal. No respiratory distress. He has no wheezes. He has no rales.  Musculoskeletal: Normal range of motion. He exhibits no edema.    Neurological: He is alert and oriented to person, place, and time.  Skin: Skin is warm and dry. No rash noted. He is not diaphoretic. No erythema.  Left ear - superior aspect of helix with bandage from dermatology  Psychiatric: He has a normal mood and affect. His behavior is normal.  Well groomed, good eye contact, normal speech and thoughts  Nursing note and vitals reviewed.  Results for orders placed or performed in visit on 16/10/96  BASIC METABOLIC PANEL WITH GFR  Result Value Ref Range   Glucose, Bld 101 (H) 65 - 99 mg/dL   BUN 18 7 - 25 mg/dL   Creat 1.77 (H) 0.70 - 1.25 mg/dL   GFR, Est Non African American 39 (L) > OR = 60 mL/min/1.70m2   GFR, Est African American 45 (L) > OR = 60 mL/min/1.45m2   BUN/Creatinine Ratio 10 6 - 22 (calc)   Sodium 142 135 - 146 mmol/L   Potassium 5.0 3.5 - 5.3 mmol/L   Chloride 110 98 - 110 mmol/L   CO2 26 20 - 32 mmol/L   Calcium 8.9 8.6 - 10.3 mg/dL  Hemoglobin A1c  Result Value Ref Range   Hgb A1c MFr Bld 5.9 (H) <5.7 % of total Hgb   Mean Plasma Glucose 123 (calc)   eAG (mmol/L) 6.8 (calc)      Assessment & Plan:   Problem List Items Addressed This Visit    CKD (chronic kidney disease), stage III (HCC) - Primary    Still stable CKD-III Cr 1.77 from 1.74 with concern for at risk progression to CKD-IV, in setting of HTN, PreDM, age 55 and former history of ibuprofen use but no longer on NSAID >1 year - Cr baseline 1.7, GFR 39-40s  Plan: 1. Continue to control BP, A1c - both improved 2. Continue to improve hydration 3. Avoid NSAIDs 4. Reviewed potential for referral to Nephrology - again deferred by their preference, will monitor labs and progress still, and if other changes will refer if inc Cr or uncontrol BP 5. Follow-up 6 months Annual + Labs      Essential hypertension    Well-controlled HTN, initial SBP again elevated 140s, actually repeat down to 110 - Home BP readings normal Complication with CKD-III close to progression  CKD-IV   Plan:  1. Continue current BP regimen - Metoprolol 25mg  BID, Losartan 25mg  daily - continue ARB with CKD for now 2. Encourage improved lifestyle - low sodium diet, regular exercise 3. Continue monitor BP outside office, bring readings to next visit, if persistently >140/90 or new symptoms notify office sooner 4. Follow-up 6 months Annual + labs      Pre-diabetes    Well-controlled Pre-DM with A1c 5.9 (improved from 6.3-6.4) Concern with HTN, CKD-III  Plan:  1. Not on any therapy currently - remain off 2. Encourage improved lifestyle -  low carb, low sugar diet, reduce portion size, continue improving regular exercise 3. Follow-up 6 months labs annual      Pulmonary emphysema (HCC)    Stable, without flare Followed by Dr Minerva Areola [facility]m, yearly Continues on Spiriva, Symbicort, Albuterol PRN Remains smoke free Will need renew Symbicort rx within 1-2 months, they will notify       Other Visit Diagnoses    Need for pneumococcal vaccine       Relevant Orders   Pneumococcal polysaccharide vaccine 23-valent greater than or equal to 2yo subcutaneous/IM (Completed)      Meds ordered this encounter  Medications  . Multiple Vitamins-Minerals (MULTIVITAMIN MEN 50+) TABS    Sig: Take daily by mouth.    Follow up plan: Return in about 6 months (around 03/27/2018) for Annual Physical.  Future labs ordered for 6 months.  [name], DO [facility] 09/27/2017, 10:24 AM

## 2017-09-27 NOTE — Assessment & Plan Note (Addendum)
Stable, without flare Followed by Dr Minerva Areola [facility]m, yearly Continues on Spiriva, Symbicort, Albuterol PRN Remains smoke free Will need renew Symbicort rx within 1-2 months, they will notify

## 2017-09-27 NOTE — Patient Instructions (Addendum)
Thank you for coming to the clinic today.  1. Keep up the good work overall!  2. A1c 5.9, improved from previously 6.3  3. BP is well controlled. Keep checking on occasion  Follow-up with Dr Raul Del as planned.  DUE for FASTING BLOOD WORK (no food or drink after midnight before the lab appointment, only water or coffee without cream/sugar on the morning of)  SCHEDULE "Lab Only" visit in the morning at the clinic for lab draw in 6 MONTHS  - Make sure Lab Only appointment is at about 1 week before your next appointment, so that results will be available  For Lab Results, once available within 2-3 days of blood draw, you can can log in to MyChart online to view your results and a brief explanation. Also, we can discuss results at next follow-up visit.   Please schedule a Follow-up Appointment to: Return in about 6 months (around 03/27/2018) for Annual Physical.  If you have any other questions or concerns, please feel free to call the clinic or send a message through [place]. You may also schedule an earlier appointment if necessary.  Additionally, you may be receiving a survey about your experience at our clinic within a few days to 1 week by e-mail or mail. We value your feedback.  [name], DO [place]

## 2017-09-27 NOTE — Assessment & Plan Note (Signed)
Still stable CKD-III Cr 1.77 from 1.74 with concern for at risk progression to CKD-IV, in setting of HTN, PreDM, age 71 and former history of ibuprofen use but no longer on NSAID >1 year - Cr baseline 1.7, GFR 39-40s  Plan: 1. Continue to control BP, A1c - both improved 2. Continue to improve hydration 3. Avoid NSAIDs 4. Reviewed potential for referral to Nephrology - again deferred by their preference, will monitor labs and progress still, and if other changes will refer if inc Cr or uncontrol BP 5. Follow-up 6 months Annual + Labs

## 2017-09-27 NOTE — Assessment & Plan Note (Signed)
Well-controlled Pre-DM with A1c 5.9 (improved from 6.3-6.4) Concern with HTN, CKD-III  Plan:  1. Not on any therapy currently - remain off 2. Encourage improved lifestyle - low carb, low sugar diet, reduce portion size, continue improving regular exercise 3. Follow-up 6 months labs annual

## 2017-09-27 NOTE — Assessment & Plan Note (Signed)
Well-controlled HTN, initial SBP again elevated 140s, actually repeat down to 110 - Home BP readings normal Complication with CKD-III close to progression CKD-IV   Plan:  1. Continue current BP regimen - Metoprolol 25mg  BID, Losartan 25mg  daily - continue ARB with CKD for now 2. Encourage improved lifestyle - low sodium diet, regular exercise 3. Continue monitor BP outside office, bring readings to next visit, if persistently >140/90 or new symptoms notify office sooner 4. Follow-up 6 months Annual + labs

## 2017-12-24 ENCOUNTER — Telehealth: Payer: Self-pay | Admitting: Family Medicine

## 2017-12-24 NOTE — Telephone Encounter (Signed)
Pt. Called requesting a refill on  nitroglycerin 0.4 mg  [facility]. Pt call back # is  [phone]

## 2017-12-24 NOTE — Telephone Encounter (Signed)
Request that patient contact his Cardiologist at [facility] for refills on Nitroglycerin.  [facility]  [address] [place], [place] [address]  [place] 12/24/2017, 5:36 PM

## 2017-12-26 NOTE — Telephone Encounter (Signed)
Patient's spouse Blanch Media  advised.

## 2018-02-12 DIAGNOSIS — I2510 Atherosclerotic heart disease of native coronary artery without angina pectoris: Secondary | ICD-10-CM | POA: Diagnosis not present

## 2018-03-24 ENCOUNTER — Encounter: Payer: Self-pay | Admitting: Family Medicine

## 2018-03-24 ENCOUNTER — Ambulatory Visit
Admission: RE | Admit: 2018-03-24 | Discharge: 2018-03-24 | Disposition: A | Discharge status: Home / Self Care | Payer: Medicare PPO | Source: Ambulatory Visit | Attending: Family Medicine | Admitting: Family Medicine

## 2018-03-24 ENCOUNTER — Ambulatory Visit: Payer: Medicare PPO | Admitting: Family Medicine

## 2018-03-24 VITALS — BP 103/61 | HR 65 | Temp 98.8°F | Resp 16 | Ht 71.0 in | Wt 222.6 lb

## 2018-03-24 DIAGNOSIS — J441 Chronic obstructive pulmonary disease with (acute) exacerbation: Secondary | ICD-10-CM | POA: Diagnosis not present

## 2018-03-24 DIAGNOSIS — J432 Centrilobular emphysema: Secondary | ICD-10-CM | POA: Diagnosis not present

## 2018-03-24 DIAGNOSIS — R05 Cough: Secondary | ICD-10-CM

## 2018-03-24 DIAGNOSIS — J984 Other disorders of lung: Secondary | ICD-10-CM | POA: Diagnosis not present

## 2018-03-24 DIAGNOSIS — J00 Acute nasopharyngitis [common cold]: Secondary | ICD-10-CM

## 2018-03-24 DIAGNOSIS — R059 Cough, unspecified: Secondary | ICD-10-CM

## 2018-03-24 DIAGNOSIS — R509 Fever, unspecified: Secondary | ICD-10-CM

## 2018-03-24 MED ORDER — IPRATROPIUM BROMIDE 0.06 % NA SOLN: 2.0000 | Freq: Four times a day (QID) | NASAL | 0 refills | Status: DC

## 2018-03-24 MED ORDER — AZITHROMYCIN 250 MG PO TABS
ORAL_TABLET | ORAL | 0 refills | Status: DC
Start: 2018-03-24 — End: 2018-05-28

## 2018-03-24 MED ORDER — PREDNISONE 50 MG PO TABS: 50.0000 mg | ORAL_TABLET | Freq: Every day | ORAL | 0 refills | Status: DC

## 2018-03-24 NOTE — Patient Instructions (Addendum)
Thank you for coming to the office today.  1. It sounds like you had an Upper Respiratory Virus that has settled into a Bronchitis, lower respiratory tract infection. I don't have concerns for pneumonia today, and think that this should gradually improve. Once you are feeling better, the cough may take a few weeks to fully resolve. I do hear coarse breath sounds, this may be due to the virus, also could be related to smoking.  Chest X-ray today - stay tuned for result, call by 1pm if not heard  May need antibiotic - stay tuned  May need prednisone  Continue Spiriva  May try OTC Mucinex (or may try Mucinex-DM for cough) up to 7-10 days then stop  FOR NOW GO AHEAD AND START - Use Albuterol inhaler 2 puffs every 4-6 hours around the clock for next 2-3 days, max up to 5 days then use as needed   If after few days to week not improving - then may temporarily may start Symbicort  - Use nasal saline (Simply Saline or Ocean Spray) to flush nasal congestion multiple times a day, may help cough - Drink plenty of fluids to improve congestion  If your symptoms seem to worsen instead of improve over next several days, including significant fever / chills, worsening shortness of breath, worsening wheezing, or nausea / vomiting and can't take medicines - return sooner or go to hospital Emergency Department for more immediate treatment.   Please schedule a Follow-up Appointment to: Return if symptoms worsen or fail to improve, for bronchitis, fever.  If you have any other questions or concerns, please feel free to call the office or send a message through [place]. You may also schedule an earlier appointment if necessary.  Additionally, you may be receiving a survey about your experience at our office within a few days to 1 week by e-mail or mail. We value your feedback.  [name], DO [place]

## 2018-03-24 NOTE — Progress Notes (Signed)
Subjective:    Patient ID: George Jensen, male    DOB: October 09, 1946, 72 y.o.   MRN: [id number]  George Jensen is a 72 y.o. male presenting on 03/24/2018 for Fever (102.5 onset Saturday toaday was 101.4 onset 3 days HA, while coughing chest hurt)  Patient presents for a same day appointment. History provided by both patient and his wife, Blanch Media.   HPI   FEVER / COUGH / COPD Reports symptoms with fever onset about 3 days ago when he came home from work with fever, intermittent episodes ranging low grade up to 101.102F. He has tried taking Tylenol PRN for headache and fever, only relief of headache temporarily, started yesterday. Additionally he has focal symptoms with coughing non productive and some discomfort across chest. - He has history of emphysema COPD, Followed by Dr Raul Del [facility]) next apt is scheduled for 04/03/18 at that time he will also get chest x-ray, he was taken off Symbicort by pulm, now only on Spiriva daily and Albuterol PRN but not using albuterol. He has not needed Symbicort and was told could take in future if need - No other sick contacts - Admits sinus congestion, headaches - Denies nausea vomiting abdominal pain diarrhea, generalized body aches, dysuria, ear pain rash chest pain or dyspnea  Health Maintenance: UTD on Pneumonia vaccines 08/2016 and 09/2017 for complete series  Depression screen [facility] 2/9 09/27/2017 06/14/2017 06/11/2017  Decreased Interest 0 0 0  Down, Depressed, Hopeless 0 0 0  PHQ - 2 Score 0 0 0  Altered sleeping 0 0 -  Tired, decreased energy 0 0 -  Change in appetite 0 0 -  Feeling bad or failure about yourself  0 0 -  Trouble concentrating 0 0 -  Moving slowly or fidgety/restless 0 0 -  Suicidal thoughts 0 0 -  PHQ-9 Score 0 0 -  Difficult doing work/chores Not difficult at all - -    Social History   Tobacco Use  . Smoking status: Former Smoker    Packs/day: 2.00    Types: Cigarettes    Last attempt to quit: 10/19/2014   Years since quitting: 3.4  . Smokeless tobacco: Never Used  Substance Use Topics  . Alcohol use: No  . Drug use: No    Review of Systems Per HPI unless specifically indicated above     Objective:    BP 103/61   Pulse 65   Temp 98.8 F (37.1 C) (Oral)   Resp 16   Ht 5\' 11"  (1.803 m)   Wt 222 lb 9.6 oz (101 kg)   SpO2 93%   BMI 31.05 kg/m   Wt Readings from Last 3 Encounters:  03/24/18 222 lb 9.6 oz (101 kg)  09/27/17 225 lb 9.6 oz (102.3 kg)  06/14/17 222 lb 6.4 oz (100.9 kg)    Physical Exam  Constitutional: He is oriented to person, place, and time. He appears well-developed and well-nourished. No distress.  Mildly ill and tired appearing, comfortable, cooperative  HENT:  Head: Normocephalic and atraumatic.  Mouth/Throat: Oropharynx is clear and moist.  Frontal / maxillary sinuses non-tender. Nares patent without purulence or edema. Bilateral TMs clear without erythema or bulging - mild clear effusion only. Oropharynx clear without erythema, exudates, edema or asymmetry.  Eyes: Conjunctivae are normal. Right eye exhibits no discharge. Left eye exhibits no discharge.  Neck: Normal range of motion. Neck supple. No thyromegaly present.  Cardiovascular: Normal rate, regular rhythm, normal heart sounds and intact distal pulses.  No murmur heard. Pulmonary/Chest: No respiratory distress. He has no wheezes.  Reduced air movement bilateral bases R>L, also with some rhonchi vs crackles on R lower base. With occasional coughing. No obvious wheezing.  Musculoskeletal: Normal range of motion. He exhibits no edema.  Lymphadenopathy:    He has no cervical adenopathy.  Neurological: He is alert and oriented to person, place, and time.  Skin: Skin is warm and dry. No rash noted. He is not diaphoretic. No erythema.  Psychiatric: He has a normal mood and affect. His behavior is normal.  Well groomed, good eye contact, normal speech and thoughts  Nursing note and vitals reviewed.  I  have personally reviewed the radiology report from 03/24/18 CXR STAT.  Narrative    CLINICAL DATA: Productive cough.  EXAM: CHEST - 2 VIEW  COMPARISON: 05/06/2015.  FINDINGS: The heart size is normal. No pleural effusion or edema. New atelectasis or scarring identified within both lung bases.  IMPRESSION: 1. New scar or atelectasis noted within the lung bases.   Electronically Signed By: Kerby Moors M.D. On: 03/24/2018 10:42     Results for orders placed or performed in visit on 67/12/45  BASIC METABOLIC PANEL WITH GFR  Result Value Ref Range   Glucose, Bld 101 (H) 65 - 99 mg/dL   BUN 18 7 - 25 mg/dL   Creat 1.77 (H) 0.70 - 1.25 mg/dL   GFR, Est Non African American 39 (L) > OR = 60 mL/min/1.16m2   GFR, Est African American 45 (L) > OR = 60 mL/min/1.57m2   BUN/Creatinine Ratio 10 6 - 22 (calc)   Sodium 142 135 - 146 mmol/L   Potassium 5.0 3.5 - 5.3 mmol/L   Chloride 110 98 - 110 mmol/L   CO2 26 20 - 32 mmol/L   Calcium 8.9 8.6 - 10.3 mg/dL  Hemoglobin A1c  Result Value Ref Range   Hgb A1c MFr Bld 5.9 (H) <5.7 % of total Hgb   Mean Plasma Glucose 123 (calc)   eAG (mmol/L) 6.8 (calc)      Assessment & Plan:   Problem List Items Addressed This Visit    Pulmonary emphysema (HCC)   Relevant Medications   ipratropium (ATROVENT) 0.06 % nasal spray   azithromycin (ZITHROMAX Z-PAK) 250 MG tablet   predniSONE (DELTASONE) 50 MG tablet   Other Relevant Orders   DG Chest 2 View (Completed)    Other Visit Diagnoses    COPD with acute exacerbation (HCC)    -  Primary   Relevant Medications   ipratropium (ATROVENT) 0.06 % nasal spray   azithromycin (ZITHROMAX Z-PAK) 250 MG tablet   predniSONE (DELTASONE) 50 MG tablet   Cough with fever       Relevant Medications   azithromycin (ZITHROMAX Z-PAK) 250 MG tablet   Other Relevant Orders   DG Chest 2 View (Completed)   Acute rhinitis       Relevant Medications   ipratropium (ATROVENT) 0.06 % nasal spray      Consistent with mild acute exacerbation of COPD with worsening cough also with fever and O2 down to 93%.  Followed by [facility] Pulmnology Dr Raul Del next apt 10 days - No hypoxia but lower (93% on RA), afebrile, no recent hospitalization - Continues Spiriva - not on symbicort  Plan: - STAT CXR today - reviewed results, called patient to discuss, spoke with Blanch Media today, advised some new atelectasis in bases of lungs, we will empirically cover for COPD exacerbation today - antibiotic azithromycin and prednisone,  also they said his lung doctor recommended incentive spirometer before they will ask him about this - Use albuterol q 4 hr regularly x 2-3 days. Continue maintenance inhalers - May restart Symbicort in future if needs - Start Atrovent nasal spray decongestant 2 sprays in each nostril up to 4 times daily for 7 days Return if not improve about 1 week if not improving, otherwise strict return criteria to go to ED    Meds ordered this encounter  Medications  . ipratropium (ATROVENT) 0.06 % nasal spray    Sig: Place 2 sprays into both nostrils 4 (four) times daily. For up to 5-7 days then stop.    Dispense:  15 mL    Refill:  0  . azithromycin (ZITHROMAX Z-PAK) 250 MG tablet    Sig: Take 2 tabs (500mg  total) on Day 1. Take 1 tab (250mg ) daily for next 4 days.    Dispense:  6 tablet    Refill:  0  . predniSONE (DELTASONE) 50 MG tablet    Sig: Take 1 tablet (50 mg total) by mouth daily with breakfast.    Dispense:  5 tablet    Refill:  0   Orders Placed This Encounter  Procedures  . DG Chest 2 View    Standing Status:   Future    Number of Occurrences:   1    Standing Expiration Date:   05/25/2019    Order Specific Question:   Reason for Exam (SYMPTOM  OR DIAGNOSIS REQUIRED)    Answer:   3 days fever cough, history of emphysema    Order Specific Question:   Preferred imaging location?    Answer:   [facility]    Order Specific Question:   Radiology Contrast Protocol - do NOT  remove file path    Answer:   [email or web address]    Follow up plan: Return if symptoms worsen or fail to improve, for bronchitis, fever.  [name], [facility] 03/24/2018, 12:37 PM

## 2018-03-25 ENCOUNTER — Ambulatory Visit: Payer: Medicare PPO

## 2018-03-28 ENCOUNTER — Encounter: Payer: Medicare PPO | Admitting: Family Medicine

## 2018-04-03 DIAGNOSIS — J439 Emphysema, unspecified: Secondary | ICD-10-CM | POA: Diagnosis not present

## 2018-04-03 DIAGNOSIS — J449 Chronic obstructive pulmonary disease, unspecified: Secondary | ICD-10-CM | POA: Diagnosis not present

## 2018-04-03 DIAGNOSIS — R0602 Shortness of breath: Secondary | ICD-10-CM | POA: Diagnosis not present

## 2018-05-04 ENCOUNTER — Other Ambulatory Visit: Payer: Self-pay | Admitting: Family Medicine

## 2018-05-04 DIAGNOSIS — J00 Acute nasopharyngitis [common cold]: Secondary | ICD-10-CM

## 2018-05-28 ENCOUNTER — Encounter: Payer: Self-pay | Admitting: Family Medicine

## 2018-05-28 ENCOUNTER — Ambulatory Visit: Payer: Medicare PPO | Admitting: Family Medicine

## 2018-05-28 VITALS — BP 97/52 | HR 66 | Temp 98.9°F | Resp 16 | Ht 71.0 in | Wt 223.0 lb

## 2018-05-28 DIAGNOSIS — J0100 Acute maxillary sinusitis, unspecified: Secondary | ICD-10-CM | POA: Diagnosis not present

## 2018-05-28 DIAGNOSIS — F325 Major depressive disorder, single episode, in full remission: Secondary | ICD-10-CM

## 2018-05-28 MED ORDER — AZITHROMYCIN 250 MG PO TABS: ORAL_TABLET | ORAL | 0 refills | Status: DC

## 2018-05-28 NOTE — Patient Instructions (Addendum)
Thank you for coming to the office today.  1. It sounds like you had an Upper Respiratory Virus that has settled into a Bronchitis, lower respiratory tract infection. I don't have concerns for pneumonia today, and think that this should gradually improve. Once you are feeling better, the cough may take a few weeks to fully resolve. I do hear coarse breath sounds, this may be due to the virus or allergy  - IF not improving after 48 hours, or develop worsening fever, productive cough then start Azithromycin Z pak (antibiotic) 2 tabs day 1, then 1 tab x 4 days, complete entire course even if improved  Start Zyrtec daily  Start Atrovent nasal spray decongestant 2 sprays in each nostril up to 4 times daily for 7 days  Use Albuterol inhaler 2 puffs every 4-6 hours around the clock for next 2-3 days, max up to 5 days then use as needed   - Use nasal saline (Simply Saline or Ocean Spray) to flush nasal congestion multiple times a day, may help cough - Drink plenty of fluids to improve congestion  If your symptoms seem to worsen instead of improve over next several days, including significant fever / chills, worsening shortness of breath, worsening wheezing, or nausea / vomiting and can't take medicines - return sooner or go to hospital Emergency Department for more immediate treatment.   Please schedule a Follow-up Appointment to: Return in about 1 week (around 06/04/2018), or if symptoms worsen or fail to improve, for Sinusitis.  If you have any other questions or concerns, please feel free to call the office or send a message through [place]. You may also schedule an earlier appointment if necessary.  Additionally, you may be receiving a survey about your experience at our office within a few days to 1 week by e-mail or mail. We value your feedback.  [name], DO [place]

## 2018-05-28 NOTE — Assessment & Plan Note (Signed)
Stable, controlled on Sertraline 50mg  daily Follow-up to focus on depression in future if worsening, or concern insomnia

## 2018-05-28 NOTE — Progress Notes (Addendum)
Subjective:    Patient ID: George Jensen, male    DOB: 1946-02-09, 72 y.o.   MRN: [id number]  George Jensen is a 72 y.o. male presenting on 05/28/2018 for Cough (yellowish mucus, ear pain, sore throat, chills onset 5 days)  Patient accompanied by wife, Blanch Media who provides additional history.  HPI   URI / SINUSITIS with productive cough / History of COPD Recent history last visit with me for similar respiratory illness 03/24/18 was treated for COPD exacerbation had CXR, treated with Azithromycin and Prednisone, and also saw Dr Raul Del [facility] Pulm, had PFTs. - Now recent course with onset sore throat about 5 days ago, recently had mowed the lawn and thinks triggered symptoms, he tried a zyrtec without relief. Now worsening with productive cough now mucus is more thick and yellow with changed color. - Continues on Spiriva inhaler inhaler - Using Albuterol PRN Admits low grade fever over weekend on Saturday approx 99.56F Admits some sinus pressure and ear pain Denies nausea vomiting, abdominal pain, vomiting, headache, rash, chest pain, hemoptysis  History of Depression in Remission Reports no new concerns. He completed PHQ screening today, he states feels good but does score 3 for 1st question and mostly related to poor sleep at times. He continues on Sertraline 50mg  daily doing well, not interested in changing medicines at this time. - He states problem with mood onset after prior MI.  Depression screen [facility] 2/9 05/28/2018 09/27/2017 06/14/2017  Decreased Interest 3 0 0  Down, Depressed, Hopeless 0 0 0  PHQ - 2 Score 3 0 0  Altered sleeping 3 0 0  Tired, decreased energy 1 0 0  Change in appetite 1 0 0  Feeling bad or failure about yourself  0 0 0  Trouble concentrating 0 0 0  Moving slowly or fidgety/restless 0 0 0  Suicidal thoughts 0 0 0  PHQ-9 Score 8 0 0  Difficult doing work/chores Not difficult at all Not difficult at all -    Social History   Tobacco Use  . Smoking status:  Former Smoker    Packs/day: 2.00    Types: Cigarettes    Last attempt to quit: 10/19/2014    Years since quitting: 3.6  . Smokeless tobacco: Never Used  Substance Use Topics  . Alcohol use: No  . Drug use: No    Review of Systems Per HPI unless specifically indicated above     Objective:    BP (!) 97/52   Pulse 66   Temp 98.9 F (37.2 C) (Oral)   Resp 16   Ht 5\' 11"  (1.803 m)   Wt 223 lb (101.2 kg)   SpO2 99%   BMI 31.10 kg/m   Wt Readings from Last 3 Encounters:  05/28/18 223 lb (101.2 kg)  03/24/18 222 lb 9.6 oz (101 kg)  09/27/17 225 lb 9.6 oz (102.3 kg)    Physical Exam  Constitutional: He is oriented to person, place, and time. He appears well-developed and well-nourished. No distress.  Well-appearing, comfortable, cooperative  HENT:  Head: Normocephalic and atraumatic.  Mouth/Throat: Oropharynx is clear and moist.  Maxillary sinuses mild -tender. Nares mostly patent with some turbinate edema and congestion without purulence. Bilateral TMs mostly clear with mild effusion L>R without erythema or bulging. Oropharynx clear without erythema, exudates, edema or asymmetry.  Eyes: Conjunctivae are normal. Right eye exhibits no discharge. Left eye exhibits no discharge.  Neck: Normal range of motion. Neck supple.  Cardiovascular: Normal rate, regular rhythm, normal  heart sounds and intact distal pulses.  No murmur heard. Pulmonary/Chest: Effort normal. No respiratory distress. He has no wheezes. He has no rales.  Mild reduced air movement. Occasional cough. No focal wheezing or crackles.  Musculoskeletal: He exhibits no edema.  Lymphadenopathy:    He has no cervical adenopathy.  Neurological: He is alert and oriented to person, place, and time.  Skin: Skin is warm and dry. No rash noted. He is not diaphoretic. No erythema.  Psychiatric: He has a normal mood and affect. His behavior is normal.  Well groomed, good eye contact, normal speech and thoughts  Nursing note and  vitals reviewed.  Results for orders placed or performed in visit on 37/10/62  BASIC METABOLIC PANEL WITH GFR  Result Value Ref Range   Glucose, Bld 101 (H) 65 - 99 mg/dL   BUN 18 7 - 25 mg/dL   Creat 1.77 (H) 0.70 - 1.25 mg/dL   GFR, Est Non African American 39 (L) > OR = 60 mL/min/1.41m2   GFR, Est African American 45 (L) > OR = 60 mL/min/1.42m2   BUN/Creatinine Ratio 10 6 - 22 (calc)   Sodium 142 135 - 146 mmol/L   Potassium 5.0 3.5 - 5.3 mmol/L   Chloride 110 98 - 110 mmol/L   CO2 26 20 - 32 mmol/L   Calcium 8.9 8.6 - 10.3 mg/dL  Hemoglobin A1c  Result Value Ref Range   Hgb A1c MFr Bld 5.9 (H) <5.7 % of total Hgb   Mean Plasma Glucose 123 (calc)   eAG (mmol/L) 6.8 (calc)      Assessment & Plan:   Problem List Items Addressed This Visit    Major depression in remission ([place])    Stable, controlled on Sertraline 50mg  daily Follow-up to focus on depression in future if worsening, or concern insomnia       Other Visit Diagnoses    Acute non-recurrent maxillary sinusitis    -  Primary   Relevant Medications   azithromycin (ZITHROMAX Z-PAK) 250 MG tablet      Consistent with acute early maxillary rhinosinusitis concern with known COPD and may be at risk progression to lower respiratory infection vs bronchitis, without obvious COPD flare now but at risk. Likely initially viral URI vs allergic rhinitis component  Plan: 1. Reassurance, likely self-limited - no indication for antibiotics at this time - however given COPD and increased sputum production will provide empiric coverage for sinus/COPD if not improved by 48 hours - rx Azithromycin Z-pak may fill if needed 2. Restart Atrovent nasal spray decongestant 2 sprays in each nostril up to 4 times daily for 7 days 3. May try OTC Mucinex-DM up to 7-10 days then stop 4. Supportive care with nasal saline OTC, hydration 5. Return criteria reviewed    Meds ordered this encounter  Medications  . azithromycin (ZITHROMAX Z-PAK)  250 MG tablet    Sig: Take 2 tabs (500mg  total) on Day 1. Take 1 tab (250mg ) daily for next 4 days.    Dispense:  6 tablet    Refill:  0      Follow up plan: Return in about 1 week (around 06/04/2018), or if symptoms worsen or fail to improve, for Sinusitis.  [name], [facility] 05/28/2018, 1:02 PM

## 2018-07-15 ENCOUNTER — Other Ambulatory Visit: Payer: Self-pay | Admitting: Family Medicine

## 2018-07-15 DIAGNOSIS — I10 Essential (primary) hypertension: Secondary | ICD-10-CM

## 2018-07-15 DIAGNOSIS — I2510 Atherosclerotic heart disease of native coronary artery without angina pectoris: Secondary | ICD-10-CM

## 2018-07-15 DIAGNOSIS — F329 Major depressive disorder, single episode, unspecified: Secondary | ICD-10-CM

## 2018-07-15 DIAGNOSIS — F32A Depression, unspecified: Secondary | ICD-10-CM

## 2018-07-15 DIAGNOSIS — K219 Gastro-esophageal reflux disease without esophagitis: Secondary | ICD-10-CM

## 2018-07-25 DIAGNOSIS — C4401 Basal cell carcinoma of skin of lip: Secondary | ICD-10-CM | POA: Diagnosis not present

## 2018-07-25 DIAGNOSIS — D485 Neoplasm of uncertain behavior of skin: Secondary | ICD-10-CM | POA: Diagnosis not present

## 2018-07-25 DIAGNOSIS — C44329 Squamous cell carcinoma of skin of other parts of face: Secondary | ICD-10-CM | POA: Diagnosis not present

## 2018-07-25 DIAGNOSIS — C44319 Basal cell carcinoma of skin of other parts of face: Secondary | ICD-10-CM | POA: Diagnosis not present

## 2018-08-21 DIAGNOSIS — C44329 Squamous cell carcinoma of skin of other parts of face: Secondary | ICD-10-CM | POA: Diagnosis not present

## 2018-09-04 DIAGNOSIS — C4491 Basal cell carcinoma of skin, unspecified: Secondary | ICD-10-CM | POA: Diagnosis not present

## 2018-09-04 DIAGNOSIS — C44319 Basal cell carcinoma of skin of other parts of face: Secondary | ICD-10-CM | POA: Diagnosis not present

## 2018-09-18 DIAGNOSIS — C4491 Basal cell carcinoma of skin, unspecified: Secondary | ICD-10-CM | POA: Diagnosis not present

## 2018-09-18 DIAGNOSIS — C4401 Basal cell carcinoma of skin of lip: Secondary | ICD-10-CM | POA: Diagnosis not present

## 2018-09-30 ENCOUNTER — Ambulatory Visit: Payer: Medicare PPO

## 2018-09-30 ENCOUNTER — Encounter: Payer: Medicare PPO | Admitting: Family Medicine

## 2018-09-30 ENCOUNTER — Other Ambulatory Visit: Payer: Medicare PPO

## 2018-09-30 DIAGNOSIS — N183 Chronic kidney disease, stage 3 unspecified: Secondary | ICD-10-CM

## 2018-09-30 DIAGNOSIS — R7303 Prediabetes: Secondary | ICD-10-CM

## 2018-09-30 DIAGNOSIS — E785 Hyperlipidemia, unspecified: Secondary | ICD-10-CM | POA: Diagnosis not present

## 2018-09-30 DIAGNOSIS — I159 Secondary hypertension, unspecified: Secondary | ICD-10-CM | POA: Diagnosis not present

## 2018-09-30 DIAGNOSIS — I10 Essential (primary) hypertension: Secondary | ICD-10-CM

## 2018-10-01 LAB — COMPREHENSIVE METABOLIC PANEL
AG RATIO: 1.7 (calc) (ref 1.0–2.5)
ALT: 19 U/L (ref 9–46)
AST: 25 U/L (ref 10–35)
Albumin: 4.1 g/dL (ref 3.6–5.1)
Alkaline phosphatase (APISO): 73 U/L (ref 40–115)
BILIRUBIN TOTAL: 0.5 mg/dL (ref 0.2–1.2)
BUN/Creatinine Ratio: 13 (calc) (ref 6–22)
BUN: 22 mg/dL (ref 7–25)
CALCIUM: 9.4 mg/dL (ref 8.6–10.3)
CHLORIDE: 108 mmol/L (ref 98–110)
CO2: 27 mmol/L (ref 20–32)
Creat: 1.71 mg/dL — ABNORMAL HIGH (ref 0.70–1.18)
GLOBULIN: 2.4 g/dL (ref 1.9–3.7)
Glucose, Bld: 96 mg/dL (ref 65–99)
Potassium: 4.7 mmol/L (ref 3.5–5.3)
Sodium: 142 mmol/L (ref 135–146)
Total Protein: 6.5 g/dL (ref 6.1–8.1)

## 2018-10-01 LAB — LIPID PANEL
CHOL/HDL RATIO: 3.5 (calc) (ref <5.0)
Cholesterol: 133 mg/dL (ref <200)
HDL: 38 mg/dL — ABNORMAL LOW (ref >40)
LDL CHOLESTEROL (CALC): 76 mg/dL
NON-HDL CHOLESTEROL (CALC): 95 mg/dL (ref <130)
Triglycerides: 100 mg/dL (ref <150)

## 2018-10-01 LAB — CBC WITH DIFFERENTIAL/PLATELET
BASOS PCT: 0.8 %
Basophils Absolute: 64 cells/uL (ref 0–200)
EOS ABS: 1512 {cells}/uL — AB (ref 15–500)
Eosinophils Relative: 18.9 %
HCT: 38.5 % (ref 38.5–50.0)
HEMOGLOBIN: 12.8 g/dL — AB (ref 13.2–17.1)
Lymphs Abs: 1392 cells/uL (ref 850–3900)
MCH: 29.9 pg (ref 27.0–33.0)
MCHC: 33.2 g/dL (ref 32.0–36.0)
MCV: 90 fL (ref 80.0–100.0)
MPV: 10.8 fL (ref 7.5–12.5)
Monocytes Relative: 9.4 %
NEUTROS ABS: 4280 {cells}/uL (ref 1500–7800)
Neutrophils Relative %: 53.5 %
Platelets: 150 10*3/uL (ref 140–400)
RBC: 4.28 10*6/uL (ref 4.20–5.80)
RDW: 12.7 % (ref 11.0–15.0)
TOTAL LYMPHOCYTE: 17.4 %
WBC: 8 10*3/uL (ref 3.8–10.8)
WBCMIX: 752 {cells}/uL (ref 200–950)

## 2018-10-01 LAB — HEMOGLOBIN A1C
HEMOGLOBIN A1C: 6.2 %{Hb} — AB (ref <5.7)
Mean Plasma Glucose: 131 (calc)
eAG (mmol/L): 7.3 (calc)

## 2018-10-03 DIAGNOSIS — J430 Unilateral pulmonary emphysema [MacLeod's syndrome]: Secondary | ICD-10-CM | POA: Diagnosis not present

## 2018-10-03 DIAGNOSIS — J439 Emphysema, unspecified: Secondary | ICD-10-CM | POA: Diagnosis not present

## 2018-10-07 ENCOUNTER — Ambulatory Visit (INDEPENDENT_AMBULATORY_CARE_PROVIDER_SITE_OTHER): Payer: Medicare PPO

## 2018-10-07 ENCOUNTER — Ambulatory Visit (INDEPENDENT_AMBULATORY_CARE_PROVIDER_SITE_OTHER): Payer: Medicare PPO | Admitting: Family Medicine

## 2018-10-07 ENCOUNTER — Encounter: Payer: Self-pay | Admitting: Family Medicine

## 2018-10-07 VITALS — BP 132/66 | HR 60 | Temp 97.7°F | Resp 16 | Ht 71.0 in | Wt 229.8 lb

## 2018-10-07 VITALS — BP 132/66 | HR 60 | Temp 97.7°F | Ht 71.0 in | Wt 229.8 lb

## 2018-10-07 DIAGNOSIS — R7303 Prediabetes: Secondary | ICD-10-CM

## 2018-10-07 DIAGNOSIS — J431 Panlobular emphysema: Secondary | ICD-10-CM | POA: Diagnosis not present

## 2018-10-07 DIAGNOSIS — N183 Chronic kidney disease, stage 3 unspecified: Secondary | ICD-10-CM

## 2018-10-07 DIAGNOSIS — E785 Hyperlipidemia, unspecified: Secondary | ICD-10-CM | POA: Diagnosis not present

## 2018-10-07 DIAGNOSIS — Z0000 Encounter for general adult medical examination without abnormal findings: Secondary | ICD-10-CM

## 2018-10-07 DIAGNOSIS — F325 Major depressive disorder, single episode, in full remission: Secondary | ICD-10-CM | POA: Diagnosis not present

## 2018-10-07 DIAGNOSIS — I10 Essential (primary) hypertension: Secondary | ICD-10-CM | POA: Diagnosis not present

## 2018-10-07 DIAGNOSIS — Z23 Encounter for immunization: Secondary | ICD-10-CM | POA: Diagnosis not present

## 2018-10-07 NOTE — Patient Instructions (Addendum)
Thank you for coming to the office today.  We will re-check kidney and sugar in 6 months.  Follow-up with Dr Raul Del with x-ray   1. Chemistry - Persistent elevated Creatinine 1.71, similar to prior results, consistent with Chronic Kidney Disease, does not seem to be worse. Other chemistry results normal.  2. Hemoglobin A1c (Pre-Diabetes) - 6.2, slightly elevated from last check 5.9, but overall still similar to prior results 6 to 6.3, still in range of Pre-Diabetes (>5.7 to 6.4)   3. Cholesterol - Mostly normal cholesterol, only mild low HDL 38, controlled on Atorvastatin 80 and Zetia  4. CBC Blood Counts - Stable from previous, Hemoglobin is borderline low at 12.8, seems stable to improved from last check.   DUE for FASTING BLOOD WORK (no food or drink after midnight before the lab appointment, only water or coffee without cream/sugar on the morning of)  SCHEDULE "Lab Only" visit in the morning at the clinic for lab draw in 6 MONTHS   - Make sure Lab Only appointment is at about 1 week before your next appointment, so that results will be available  For Lab Results, once available within 2-3 days of blood draw, you can can log in to MyChart online to view your results and a brief explanation. Also, we can discuss results at next follow-up visit.   Please schedule a Follow-up Appointment to: Return in about 6 months (around 04/07/2019) for 6 month lab review, PreDM, CKD.  If you have any other questions or concerns, please feel free to call the office or send a message through [place]. You may also schedule an earlier appointment if necessary.  Additionally, you may be receiving a survey about your experience at our office within a few days to 1 week by e-mail or mail. We value your feedback.  [name], DO [place]

## 2018-10-07 NOTE — Progress Notes (Signed)
Subjective:   George Jensen is a 72 y.o. male who presents for Medicare Annual/Subsequent preventive examination.  Review of Systems:  N/A  Cardiac Risk Factors include: advanced age (>77men, >29 women);male gender;obesity (BMI >30kg/m2);dyslipidemia;hypertension     Objective:    Vitals: BP 132/66 (BP Location: Right Arm)   Pulse 60   Temp 97.7 F (36.5 C) (Oral)   Ht 5\' 11"  (1.803 m)   Wt 229 lb 12.8 oz (104.2 kg)   BMI 32.05 kg/m   Body mass index is 32.05 kg/m.  Advanced Directives 10/07/2018 06/11/2017 09/16/2015  Does Patient Have a Medical Advance Directive? No Yes No  Type of Advance Directive - [place];Living will -  Copy of [place] in Chart? - No - copy requested -  Would patient like information on creating a medical advance directive? - - No - patient declined information    Tobacco Social History   Tobacco Use  Smoking Status Former Smoker  . Packs/day: 2.00  . Types: Cigarettes  . Last attempt to quit: 10/19/2014  . Years since quitting: 3.9  Smokeless Tobacco Never Used     Counseling given: Not Answered   Clinical Intake:  Pre-visit preparation completed: Yes  Pain : No/denies pain Pain Score: 0-No pain     Nutritional Status: BMI > 30  Obese Nutritional Risks: None Diabetes: No  How often do you need to have someone help you when you read instructions, pamphlets, or other written materials from your doctor or pharmacy?: 1 - Never  Interpreter Needed?: No  Information entered by :: [facility], LPN  Past Medical History:  Diagnosis Date  . BCC (basal cell carcinoma)    x 2 on face  . COPD (chronic obstructive pulmonary disease) ([place])   . Hyperlipidemia   . Hypertension   . Melanoma ([place])   . Myocardial infarction ([place]) 10/2014  . SCCA (squamous cell carcinoma) of skin    SCC x 1 on face   Past Surgical History:  Procedure Laterality Date  . CAROTID STENT  10/2014   3 STENTS [facility]  . MELANOMA EXCISION  1982   Family History  Problem Relation Age of Onset  . Alzheimer's disease Mother   . Diabetes Father   . Heart disease Father    Social History   Socioeconomic History  . Marital status: Married    Spouse name: Tauren Delbuono  . Number of children: 4  . Years of education: Not on file  . Highest education level: 9th grade  Occupational History  . Occupation: Therapist, occupational  Social Needs  . Financial resource strain: Not hard at all  . Food insecurity:    Worry: Never true    Inability: Never true  . Transportation needs:    Medical: No    Non-medical: No  Tobacco Use  . Smoking status: Former Smoker    Packs/day: 2.00    Types: Cigarettes    Last attempt to quit: 10/19/2014    Years since quitting: 3.9  . Smokeless tobacco: Never Used  Substance and Sexual Activity  . Alcohol use: No  . Drug use: No  . Sexual activity: Not on file  Lifestyle  . Physical activity:    Days per week: 0 days    Minutes per session: 0 min  . Stress: Not at all  Relationships  . Social connections:    Talks on phone: Patient refused    Gets together: Patient refused  Attends religious service: Patient refused    Active member of club or organization: Patient refused    Attends meetings of clubs or organizations: Patient refused    Relationship status: Patient refused  Other Topics Concern  . Not on file  Social History Narrative  . Not on file    Outpatient Encounter Medications as of 10/07/2018  Medication Sig  . albuterol (PROVENTIL HFA;VENTOLIN HFA) 108 (90 BASE) MCG/ACT inhaler Inhale 2 puffs into the lungs every 4 (four) hours as needed for wheezing or shortness of breath (or shortness of breath).  Marland Kitchen aspirin 81 MG EC tablet TAKE 1 TABLET EVERY DAY  . atorvastatin (LIPITOR) 80 MG tablet Take 80 mg by mouth daily at 6 PM.   . ezetimibe (ZETIA) 10 MG tablet Take 10 mg by mouth daily.   Marland Kitchen losartan (COZAAR) 25 MG tablet TAKE 1 TABLET EVERY  DAY  . metoprolol tartrate (LOPRESSOR) 25 MG tablet Take 25 mg by mouth 2 (two) times daily.   . Multiple Vitamins-Minerals (MULTIVITAMIN MEN 50+) TABS Take daily by mouth.  . nitroGLYCERIN (NITROSTAT) 0.4 MG SL tablet Place 0.4 mg under the tongue every 5 (five) minutes as needed for chest pain. AS NEEDED  . omeprazole (PRILOSEC) 40 MG capsule TAKE 1 CAPSULE EVERY DAY  . sertraline (ZOLOFT) 50 MG tablet TAKE 1 TABLET EVERY DAY  . tiotropium (SPIRIVA) 18 MCG inhalation capsule Place 18 mcg into inhaler and inhale every other day.   Marland Kitchen azithromycin (ZITHROMAX Z-PAK) 250 MG tablet Take 2 tabs (500mg  total) on Day 1. Take 1 tab (250mg ) daily for next 4 days. (Patient not taking: Reported on 10/07/2018)  . budesonide-formoterol (SYMBICORT) 160-4.5 MCG/ACT inhaler Inhale 2 puffs twice daily (Patient not taking: Reported on 10/07/2018)  . ipratropium (ATROVENT) 0.06 % nasal spray PLACE 2 SPRAYS INTO BOTH NOSTRILS 4 (FOUR) TIMES DAILY. FOR UP TO 5-7 DAYS THEN STOP. (Patient not taking: Reported on 10/07/2018)   No facility-administered encounter medications on file as of 10/07/2018.     Activities of Daily Living In your present state of health, do you have any difficulty performing the following activities: 10/07/2018  Hearing? Y  Comment Does not wear hearing aids.   Vision? Y  Comment Wears eye glasses.   Difficulty concentrating or making decisions? Y  Walking or climbing stairs? Y  Comment Due to hip pains.  Dressing or bathing? N  Doing errands, shopping? N  Preparing Food and eating ? N  Using the Toilet? N  In the past six months, have you accidently leaked urine? N  Do you have problems with loss of bowel control? N  Managing your Medications? N  Managing your Finances? N  Housekeeping or managing your Housekeeping? N  Some recent data might be hidden    Patient Care Team: Olin Hauser, DO as PCP - General (Family Medicine) Erby Pian, MD as Referring  Physician (Specialist) Shela Nevin, MD as Referring Physician (Cardiology) Anell Barr, OD (Optometry) Jannet Mantis, MD (Dermatology)   Assessment:   This is a routine wellness examination for George Jensen.  Exercise Activities and Dietary recommendations Current Exercise Habits: The patient does not participate in regular exercise at present, Exercise limited by: None identified  Goals    . DIET - REDUCE SUGAR INTAKE     Recommend cutting out all sweets and junk food in diet to help aid in weight loss.     . Increase water intake     Recommend drinking at  least 5-6 glasses of water a day       Fall Risk Fall Risk  10/07/2018 06/11/2017 03/12/2017 12/22/2015 09/16/2015  Falls in the past year? 0 No No No No   FALL RISK PREVENTION PERTAINING TO THE HOME:  Any stairs in or around the home WITH handrails? No  Home free of loose throw rugs in walkways, pet beds, electrical cords, etc? Yes  Adequate lighting in your home to reduce risk of falls? Yes   ASSISTIVE DEVICES UTILIZED TO PREVENT FALLS:  Life alert? No  Use of a cane, walker or w/c? No  Grab bars in the bathroom? No  Shower chair or bench in shower? No  Elevated toilet seat or a handicapped toilet? No    TIMED UP AND GO:  Was the test performed? No .     Depression Screen PHQ 2/9 Scores 10/07/2018 05/28/2018 09/27/2017 06/14/2017  PHQ - 2 Score 0 3 0 0  PHQ- 9 Score - 8 0 0    Cognitive Function     6CIT Screen 10/07/2018 06/11/2017  What Year? 0 points 0 points  What month? 0 points 0 points  What time? 0 points 0 points  Count back from 20 0 points 0 points  Months in reverse 0 points 0 points  Repeat phrase 2 points 0 points  Total Score 2 0    Immunization History  Administered Date(s) Administered  . Influenza, High Dose Seasonal PF 08/12/2015, 09/06/2016, 09/20/2017, 10/07/2018  . Pneumococcal Conjugate-13 08/12/2015, 09/06/2016  . Pneumococcal Polysaccharide-23 09/27/2017     Qualifies for Shingles Vaccine? Yes. Due for Shingrix. Education has been provided regarding the importance of this vaccine. Pt has been advised to call insurance company to determine out of pocket expense. Advised may also receive vaccine at local pharmacy or Health Dept. Verbalized acceptance and understanding.  Tdap: Up to date  Flu Vaccine: Due for Flu vaccine. Does the patient want to receive this vaccine today?  Yes .   Pneumococcal Vaccine: Up to date   Screening Tests Health Maintenance  Topic Date Due  . Hepatitis C Screening  February 11, 1946  . COLONOSCOPY  11/19/2021  . TETANUS/TDAP  09/15/2025  . INFLUENZA VACCINE  Completed  . PNA vac Low Risk Adult  Completed   Cancer Screenings:  Colorectal Screening: Completed 11/2011. Repeat every 10 years.  Lung Cancer Screening: (Low Dose CT Chest recommended if Age 72-80 years, 30 pack-year currently smoking OR have quit w/in 15years.) does qualify, however declines due to having a scan with Dr Raul Del last week.   Additional Screening:  Hepatitis C Screening: does qualify; however declines and would like this completed with next blood work orders.   Vision Screening: Recommended annual ophthalmology exams for early detection of glaucoma and other disorders of the eye.  Dental Screening: Recommended annual dental exams for proper oral hygiene  Community Resource Referral:  CRR required this visit?  No        Plan:  I have personally reviewed and addressed the Medicare Annual Wellness questionnaire and have noted the following in the patient's chart:  A. Medical and social history B. Use of alcohol, tobacco or illicit drugs  C. Current medications and supplements D. Functional ability and status E.  Nutritional status F.  Physical activity G. Advance directives H. List of other physicians I.  Hospitalizations, surgeries, and ER visits in previous 12 months J.  [place] such as hearing and vision if  needed, cognitive and depression L. Referrals and  appointments - none  In addition, I have reviewed and discussed with patient certain preventive protocols, quality metrics, and best practice recommendations. A written personalized care plan for preventive services as well as general preventive health recommendations were provided to patient.  See attached scanned questionnaire for additional information.   Signed,  Fabio Neighbors, LPN Nurse Health Advisor   Nurse Recommendations: Pt declined the Hep C lab today and would like this order added to next blood work orders from PCP.

## 2018-10-07 NOTE — Patient Instructions (Addendum)
[name] , Thank you for taking time to come for your Medicare Wellness Visit. I appreciate your ongoing commitment to your health goals. Please review the following plan we discussed and let me know if I can assist you in the future.   Screening recommendations/referrals: Colonoscopy: Up to date, due 11/2021 Recommended yearly ophthalmology/optometry visit for glaucoma screening and checkup Recommended yearly dental visit for hygiene and checkup  Vaccinations: Influenza vaccine: Administered today.  Pneumococcal vaccine: Completed series Tdap vaccine: Up to date, 08/2025 Shingles vaccine: Pt declines today.     Advanced directives: Please bring a copy of your POA (Power of Attorney) and/or Living Will to your next appointment.   Conditions/risks identified: Obesity- recommend cutting out all sweets and junk food in diet to help aid in weight loss.   Next appointment: 10:40 AM with Dr Donald Pore.  Preventive Care 70 Years and Older, Male Preventive care refers to lifestyle choices and visits with your health care provider that can promote health and wellness. What does preventive care include?  A yearly physical exam. This is also called an annual well check.  Dental exams once or twice a year.  Routine eye exams. Ask your health care provider how often you should have your eyes checked.  Personal lifestyle choices, including:  Daily care of your teeth and gums.  Regular physical activity.  Eating a healthy diet.  Avoiding tobacco and drug use.  Limiting alcohol use.  Practicing safe sex.  Taking low doses of aspirin every day.  Taking vitamin and mineral supplements as recommended by your health care provider. What happens during an annual well check? The services and screenings done by your health care provider during your annual well check will depend on your age, overall health, lifestyle risk factors, and family history of disease. Counseling  Your health care  provider may ask you questions about your:  Alcohol use.  Tobacco use.  Drug use.  Emotional well-being.  Home and relationship well-being.  Sexual activity.  Eating habits.  History of falls.  Memory and ability to understand (cognition).  Work and work Statistician. Screening  You may have the following tests or measurements:  Height, weight, and BMI.  Blood pressure.  Lipid and cholesterol levels. These may be checked every 5 years, or more frequently if you are over 31 years old.  Skin check.  Lung cancer screening. You may have this screening every year starting at age 63 if you have a 30-pack-year history of smoking and currently smoke or have quit within the past 15 years.  Fecal occult blood test (FOBT) of the stool. You may have this test every year starting at age 8.  Flexible sigmoidoscopy or colonoscopy. You may have a sigmoidoscopy every 5 years or a colonoscopy every 10 years starting at age 56.  Prostate cancer screening. Recommendations will vary depending on your family history and other risks.  Hepatitis C blood test.  Hepatitis B blood test.  Sexually transmitted disease (STD) testing.  Diabetes screening. This is done by checking your blood sugar (glucose) after you have not eaten for a while (fasting). You may have this done every 1-3 years.  Abdominal aortic aneurysm (AAA) screening. You may need this if you are a current or former smoker.  Osteoporosis. You may be screened starting at age 67 if you are at high risk. Talk with your health care provider about your test results, treatment options, and if necessary, the need for more tests. Vaccines  Your health care  provider may recommend certain vaccines, such as:  Influenza vaccine. This is recommended every year.  Tetanus, diphtheria, and acellular pertussis (Tdap, Td) vaccine. You may need a Td booster every 10 years.  Zoster vaccine. You may need this after age 72.  Pneumococcal  13-valent conjugate (PCV13) vaccine. One dose is recommended after age 76.  Pneumococcal polysaccharide (PPSV23) vaccine. One dose is recommended after age 73. Talk to your health care provider about which screenings and vaccines you need and how often you need them. This information is not intended to replace advice given to you by your health care provider. Make sure you discuss any questions you have with your health care provider. Document Released: 12/02/2015 Document Revised: 07/25/2016 Document Reviewed: 09/06/2015 Elsevier Interactive Patient Education  2017 [place] Prevention in the Home Falls can cause injuries. They can happen to people of all ages. There are many things you can do to make your home safe and to help prevent falls. What can I do on the outside of my home?  Regularly fix the edges of walkways and driveways and fix any cracks.  Remove anything that might make you trip as you walk through a door, such as a raised step or threshold.  Trim any bushes or trees on the path to your home.  Use bright outdoor lighting.  Clear any walking paths of anything that might make someone trip, such as rocks or tools.  Regularly check to see if handrails are loose or broken. Make sure that both sides of any steps have handrails.  Any raised decks and porches should have guardrails on the edges.  Have any leaves, snow, or ice cleared regularly.  Use sand or salt on walking paths during winter.  Clean up any spills in your garage right away. This includes oil or grease spills. What can I do in the bathroom?  Use night lights.  Install grab bars by the toilet and in the tub and shower. Do not use towel bars as grab bars.  Use non-skid mats or decals in the tub or shower.  If you need to sit down in the shower, use a plastic, non-slip stool.  Keep the floor dry. Clean up any water that spills on the floor as soon as it happens.  Remove soap buildup in the  tub or shower regularly.  Attach bath mats securely with double-sided non-slip rug tape.  Do not have throw rugs and other things on the floor that can make you trip. What can I do in the bedroom?  Use night lights.  Make sure that you have a light by your bed that is easy to reach.  Do not use any sheets or blankets that are too big for your bed. They should not hang down onto the floor.  Have a firm chair that has side arms. You can use this for support while you get dressed.  Do not have throw rugs and other things on the floor that can make you trip. What can I do in the kitchen?  Clean up any spills right away.  Avoid walking on wet floors.  Keep items that you use a lot in easy-to-reach places.  If you need to reach something above you, use a strong step stool that has a grab bar.  Keep electrical cords out of the way.  Do not use floor polish or wax that makes floors slippery. If you must use wax, use non-skid floor wax.  Do not have throw  rugs and other things on the floor that can make you trip. What can I do with my stairs?  Do not leave any items on the stairs.  Make sure that there are handrails on both sides of the stairs and use them. Fix handrails that are broken or loose. Make sure that handrails are as long as the stairways.  Check any carpeting to make sure that it is firmly attached to the stairs. Fix any carpet that is loose or worn.  Avoid having throw rugs at the top or bottom of the stairs. If you do have throw rugs, attach them to the floor with carpet tape.  Make sure that you have a light switch at the top of the stairs and the bottom of the stairs. If you do not have them, ask someone to add them for you. What else can I do to help prevent falls?  Wear shoes that:  Do not have high heels.  Have rubber bottoms.  Are comfortable and fit you well.  Are closed at the toe. Do not wear sandals.  If you use a stepladder:  Make sure that it  is fully opened. Do not climb a closed stepladder.  Make sure that both sides of the stepladder are locked into place.  Ask someone to hold it for you, if possible.  Clearly mark and make sure that you can see:  Any grab bars or handrails.  First and last steps.  Where the edge of each step is.  Use tools that help you move around (mobility aids) if they are needed. These include:  Canes.  Walkers.  Scooters.  Crutches.  Turn on the lights when you go into a dark area. Replace any light bulbs as soon as they burn out.  Set up your furniture so you have a clear path. Avoid moving your furniture around.  If any of your floors are uneven, fix them.  If there are any pets around you, be aware of where they are.  Review your medicines with your doctor. Some medicines can make you feel dizzy. This can increase your chance of falling. Ask your doctor what other things that you can do to help prevent falls. This information is not intended to replace advice given to you by your health care provider. Make sure you discuss any questions you have with your health care provider. Document Released: 09/01/2009 Document Revised: 04/12/2016 Document Reviewed: 12/10/2014 Elsevier Interactive Patient Education  2017 [employer].

## 2018-10-07 NOTE — Progress Notes (Signed)
Subjective:    Patient ID: George Jensen, male    DOB: 1946-10-11, 72 y.o.   MRN: [id number]  George Jensen is a 72 y.o. male presenting on 10/07/2018 for Annual Exam  Also had AMW today  HPI   Here for Annual Physical and Lab Review  Pre-Diabetes: A1c up to 6.2, from 5.9 CBGs: Does not check CBG Meds: No medication(never on med) Currently on ARB Lifestyle: -Diet (Improved diet reduced carbs and sweets, drinking more water - still not always adhering to diet) -Exercise (working everyday outdoors, and walking regularly) - Taking ASA 81mg , Statin Denies hypoglycemia, polyuria, visual changes, numbness or tingling.  CHRONIC HTN: Reports no concerns. Home BP readings are ranging from 110-130 / 60-70s Current Meds - Metoprolol 25mg  BID, Losartan 25mg  Reports good compliance, took meds today. Tolerating well, w/o complaints.  CKD-III - Known history of CKD-III with elevated Cr >1.5 to 1.7 for past >2 years, his GFR has been in range 39 to 40s. He has never seen Nephrology. - Recent lab stable Cr 1.7 - Today patient reports no new concerns. He is doing well remaining off NSAIDs, admits to prior history of chronic NSAIDs for years in past. - Stays well hydrated with water >4 glasses daily  Mild Anemia, Normocytic CBC lab shows Hgb improved to 12.8 from prior He denies symptoms  COPD, Former Smoker Recently been back to Dr Raul Del [facility] - Off symbicort - On Spiriva every other day - Rarely using Albuterol PRN  Awaiting PFT results and Chest X-ray  Health Maintenance: UTD Pneumonia vaccine series, last doses 09/06/16 and 09/27/17 for Prevnar13 and Pneumovax23  Due for routine Hep C screening with next lab  Due for Flu Shot, already received today 10/07/18   Depression screen [facility] 2/9 10/07/2018 05/28/2018 09/27/2017  Decreased Interest 0 3 0  Down, Depressed, Hopeless 0 0 0  PHQ - 2 Score 0 3 0  Altered sleeping - 3 0  Tired, decreased energy - 1 0    Change in appetite - 1 0  Feeling bad or failure about yourself  - 0 0  Trouble concentrating - 0 0  Moving slowly or fidgety/restless - 0 0  Suicidal thoughts - 0 0  PHQ-9 Score - 8 0  Difficult doing work/chores - Not difficult at all Not difficult at all    Past Medical History:  Diagnosis Date  . BCC (basal cell carcinoma)    x 2 on face  . COPD (chronic obstructive pulmonary disease) ([place])   . Hyperlipidemia   . Hypertension   . Melanoma ([place])   . Myocardial infarction ([place]) 10/2014  . SCCA (squamous cell carcinoma) of skin    SCC x 1 on face   Past Surgical History:  Procedure Laterality Date  . CAROTID STENT  10/2014   3 STENTS [facility]  . MELANOMA EXCISION  1982   Social History   Socioeconomic History  . Marital status: Married    Spouse name: Johnross Nabozny  . Number of children: 4  . Years of education: Not on file  . Highest education level: 9th grade  Occupational History  . Occupation: Therapist, occupational  Social Needs  . Financial resource strain: Not hard at all  . Food insecurity:    Worry: Never true    Inability: Never true  . Transportation needs:    Medical: No    Non-medical: No  Tobacco Use  . Smoking status: Former Smoker    Packs/day:  2.00    Types: Cigarettes    Last attempt to quit: 10/19/2014    Years since quitting: 3.9  . Smokeless tobacco: Never Used  Substance and Sexual Activity  . Alcohol use: No  . Drug use: No  . Sexual activity: Not on file  Lifestyle  . Physical activity:    Days per week: 0 days    Minutes per session: 0 min  . Stress: Not at all  Relationships  . Social connections:    Talks on phone: Patient refused    Gets together: Patient refused    Attends religious service: Patient refused    Active member of club or organization: Patient refused    Attends meetings of clubs or organizations: Patient refused    Relationship status: Patient refused  . Intimate partner violence:    Fear of current  or ex partner: Patient refused    Emotionally abused: Patient refused    Physically abused: Patient refused    Forced sexual activity: Patient refused  Other Topics Concern  . Not on file  Social History Narrative  . Not on file   Family History  Problem Relation Age of Onset  . Alzheimer's disease Mother   . Diabetes Father   . Heart disease Father    Current Outpatient Medications on File Prior to Visit  Medication Sig  . albuterol (PROVENTIL HFA;VENTOLIN HFA) 108 (90 BASE) MCG/ACT inhaler Inhale 2 puffs into the lungs every 4 (four) hours as needed for wheezing or shortness of breath (or shortness of breath).  Marland Kitchen aspirin 81 MG EC tablet TAKE 1 TABLET EVERY DAY  . atorvastatin (LIPITOR) 80 MG tablet Take 80 mg by mouth daily at 6 PM.   . budesonide-formoterol (SYMBICORT) 160-4.5 MCG/ACT inhaler Inhale 2 puffs twice daily  . ezetimibe (ZETIA) 10 MG tablet Take 10 mg by mouth daily.   Marland Kitchen ipratropium (ATROVENT) 0.06 % nasal spray PLACE 2 SPRAYS INTO BOTH NOSTRILS 4 (FOUR) TIMES DAILY. FOR UP TO 5-7 DAYS THEN STOP.  Marland Kitchen losartan (COZAAR) 25 MG tablet TAKE 1 TABLET EVERY DAY  . metoprolol tartrate (LOPRESSOR) 25 MG tablet Take 25 mg by mouth 2 (two) times daily.   . Multiple Vitamins-Minerals (MULTIVITAMIN MEN 50+) TABS Take daily by mouth.  . nitroGLYCERIN (NITROSTAT) 0.4 MG SL tablet Place 0.4 mg under the tongue every 5 (five) minutes as needed for chest pain. AS NEEDED  . omeprazole (PRILOSEC) 40 MG capsule TAKE 1 CAPSULE EVERY DAY  . sertraline (ZOLOFT) 50 MG tablet TAKE 1 TABLET EVERY DAY  . tiotropium (SPIRIVA) 18 MCG inhalation capsule Place 18 mcg into inhaler and inhale every other day.    No current facility-administered medications on file prior to visit.     Review of Systems  Constitutional: Negative for activity change, appetite change, chills, diaphoresis, fatigue and fever.  HENT: Negative for congestion and hearing loss.   Eyes: Negative for visual disturbance.   Respiratory: Negative for apnea, cough, chest tightness, shortness of breath and wheezing.   Cardiovascular: Negative for chest pain, palpitations and leg swelling.  Gastrointestinal: Negative for abdominal pain, anal bleeding, blood in stool, constipation, diarrhea, nausea and vomiting.  Endocrine: Negative for cold intolerance.  Genitourinary: Negative for dysuria, frequency and hematuria.  Musculoskeletal: Negative for arthralgias, back pain and neck pain.  Skin: Negative for rash.  Allergic/Immunologic: Negative for environmental allergies.  Neurological: Negative for dizziness, weakness, light-headedness, numbness and headaches.  Hematological: Negative for adenopathy.  Psychiatric/Behavioral: Negative for behavioral problems, dysphoric mood  and sleep disturbance. The patient is not nervous/anxious.    Per HPI unless specifically indicated above     Objective:    BP 132/66   Pulse 60   Temp 97.7 F (36.5 C) (Oral)   Resp 16   Ht 5\' 11"  (1.803 m)   Wt 229 lb 12.8 oz (104.2 kg)   BMI 32.05 kg/m   Wt Readings from Last 3 Encounters:  10/07/18 229 lb 12.8 oz (104.2 kg)  10/07/18 229 lb 12.8 oz (104.2 kg)  05/28/18 223 lb (101.2 kg)    Physical Exam  Constitutional: He is oriented to person, place, and time. He appears well-developed and well-nourished. No distress.  Well-appearing, comfortable, cooperative  HENT:  Head: Normocephalic and atraumatic.  Mouth/Throat: Oropharynx is clear and moist.  Eyes: Pupils are equal, round, and reactive to light. Conjunctivae and EOM are normal. Right eye exhibits no discharge. Left eye exhibits no discharge.  Neck: Normal range of motion. Neck supple. No thyromegaly present.  Cardiovascular: Normal rate, regular rhythm, normal heart sounds and intact distal pulses.  No murmur heard. Pulmonary/Chest: Effort normal and breath sounds normal. No respiratory distress. He has no wheezes. He has no rales.  Abdominal: Soft. Bowel sounds are  normal. He exhibits no distension and no mass. There is no tenderness.  Musculoskeletal: Normal range of motion. He exhibits no edema or tenderness.  Upper / Lower Extremities: - Normal muscle tone, strength bilateral upper extremities 5/5, lower extremities 5/5  Lymphadenopathy:    He has no cervical adenopathy.  Neurological: He is alert and oriented to person, place, and time.  Distal sensation intact to light touch all extremities  Skin: Skin is warm and dry. No rash noted. He is not diaphoretic. No erythema.  Psychiatric: He has a normal mood and affect. His behavior is normal.  Well groomed, good eye contact, normal speech and thoughts  Nursing note and vitals reviewed.  Results for orders placed or performed in visit on 09/30/18  Comprehensive Metabolic Panel (CMET)  Result Value Ref Range   Glucose, Bld 96 65 - 99 mg/dL   BUN 22 7 - 25 mg/dL   Creat 1.71 (H) 0.70 - 1.18 mg/dL   BUN/Creatinine Ratio 13 6 - 22 (calc)   Sodium 142 135 - 146 mmol/L   Potassium 4.7 3.5 - 5.3 mmol/L   Chloride 108 98 - 110 mmol/L   CO2 27 20 - 32 mmol/L   Calcium 9.4 8.6 - 10.3 mg/dL   Total Protein 6.5 6.1 - 8.1 g/dL   Albumin 4.1 3.6 - 5.1 g/dL   Globulin 2.4 1.9 - 3.7 g/dL (calc)   AG Ratio 1.7 1.0 - 2.5 (calc)   Total Bilirubin 0.5 0.2 - 1.2 mg/dL   Alkaline phosphatase (APISO) 73 40 - 115 U/L   AST 25 10 - 35 U/L   ALT 19 9 - 46 U/L  CBC with Differential  Result Value Ref Range   WBC 8.0 3.8 - 10.8 Thousand/uL   RBC 4.28 4.20 - 5.80 Million/uL   Hemoglobin 12.8 (L) 13.2 - 17.1 g/dL   HCT 38.5 38.5 - 50.0 %   MCV 90.0 80.0 - 100.0 fL   MCH 29.9 27.0 - 33.0 pg   MCHC 33.2 32.0 - 36.0 g/dL   RDW 12.7 11.0 - 15.0 %   Platelets 150 140 - 400 Thousand/uL   MPV 10.8 7.5 - 12.5 fL   Neutro Abs 4,280 1,500 - 7,800 cells/uL   Lymphs Abs 1,392 850 -  3,900 cells/uL   WBC mixed population 752 200 - 950 cells/uL   Eosinophils Absolute 1,512 (H) 15 - 500 cells/uL   Basophils Absolute 64 0 -  200 cells/uL   Neutrophils Relative % 53.5 %   Total Lymphocyte 17.4 %   Monocytes Relative 9.4 %   Eosinophils Relative 18.9 %   Basophils Relative 0.8 %  Lipid Profile  Result Value Ref Range   Cholesterol 133 <200 mg/dL   HDL 38 (L) >40 mg/dL   Triglycerides 100 <150 mg/dL   LDL Cholesterol (Calc) 76 mg/dL (calc)   Total CHOL/HDL Ratio 3.5 <5.0 (calc)   Non-HDL Cholesterol (Calc) 95 <130 mg/dL (calc)  HgB A1c  Result Value Ref Range   Hgb A1c MFr Bld 6.2 (H) <5.7 % of total Hgb   Mean Plasma Glucose 131 (calc)   eAG (mmol/L) 7.3 (calc)      Assessment & Plan:   Problem List Items Addressed This Visit    CKD (chronic kidney disease), stage III (HCC) Cr stable 1.7 at baseline Control BP Improve hydration Avoid nephrotoxic meds Follow-up in future consider Nephrology    Dyslipidemia Mostly controlled Last lipid 09/2018 On Zetia and Statin high intensity atorv 80 Follow-up w/ cardiology    Essential hypertension Controlled HTN Continue current regimen, low dose losartan metoprolol    Major depression in remission ([place]) Well controlled, in remission On Setraline    Pre-diabetes Elevated A1c up to 6.2 Encourage improve lifestyle diet exercise    Pulmonary emphysema (HCC) Followed by [facility] Dr Raul Del Had PFTs On Spiriva intermittent Rare Albuterol PRN     Other Visit Diagnoses    Annual physical exam    -  Primary      Updated Health Maintenance information Reviewed recent lab results with patient Encouraged improvement to lifestyle with diet and exercise - Goal of weight loss  No orders of the defined types were placed in this encounter.   Follow up plan: Return in about 6 months (around 04/07/2019) for 6 month lab review, PreDM, CKD.  [name], DO [facility] 10/07/2018, 11:05 AM

## 2018-10-08 ENCOUNTER — Other Ambulatory Visit: Payer: Self-pay | Admitting: Family Medicine

## 2018-10-08 DIAGNOSIS — Z1159 Encounter for screening for other viral diseases: Secondary | ICD-10-CM

## 2018-10-08 DIAGNOSIS — R7303 Prediabetes: Secondary | ICD-10-CM

## 2018-10-08 DIAGNOSIS — N183 Chronic kidney disease, stage 3 unspecified: Secondary | ICD-10-CM

## 2018-10-22 ENCOUNTER — Ambulatory Visit: Payer: Medicare PPO

## 2018-10-22 ENCOUNTER — Encounter: Payer: Medicare PPO | Admitting: Family Medicine

## 2019-01-02 IMAGING — DX DG CHEST 2V
3 series · 3 of 3 positions shown · non-contrast
Comparison: 05/06/2015.

CLINICAL DATA: Productive cough.

EXAM:
CHEST - 2 VIEW

[chest pa (1 of 2)]
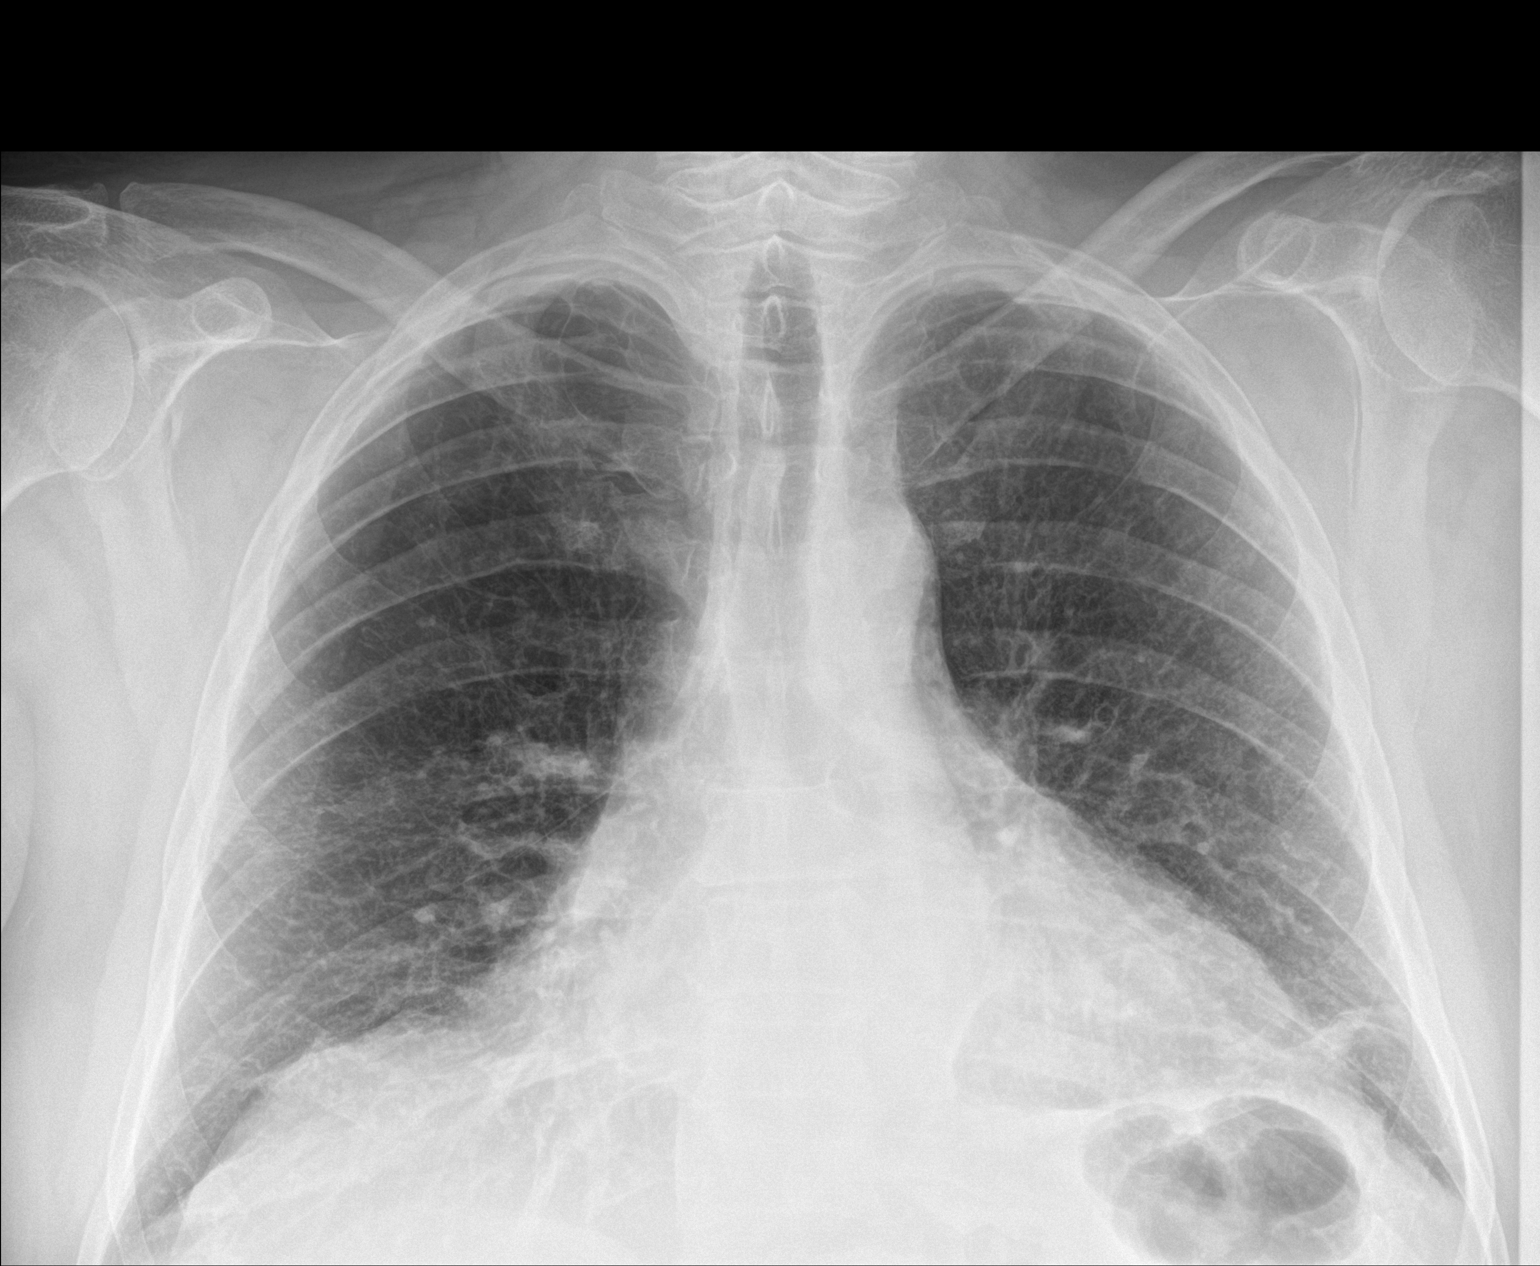

[chest lat]
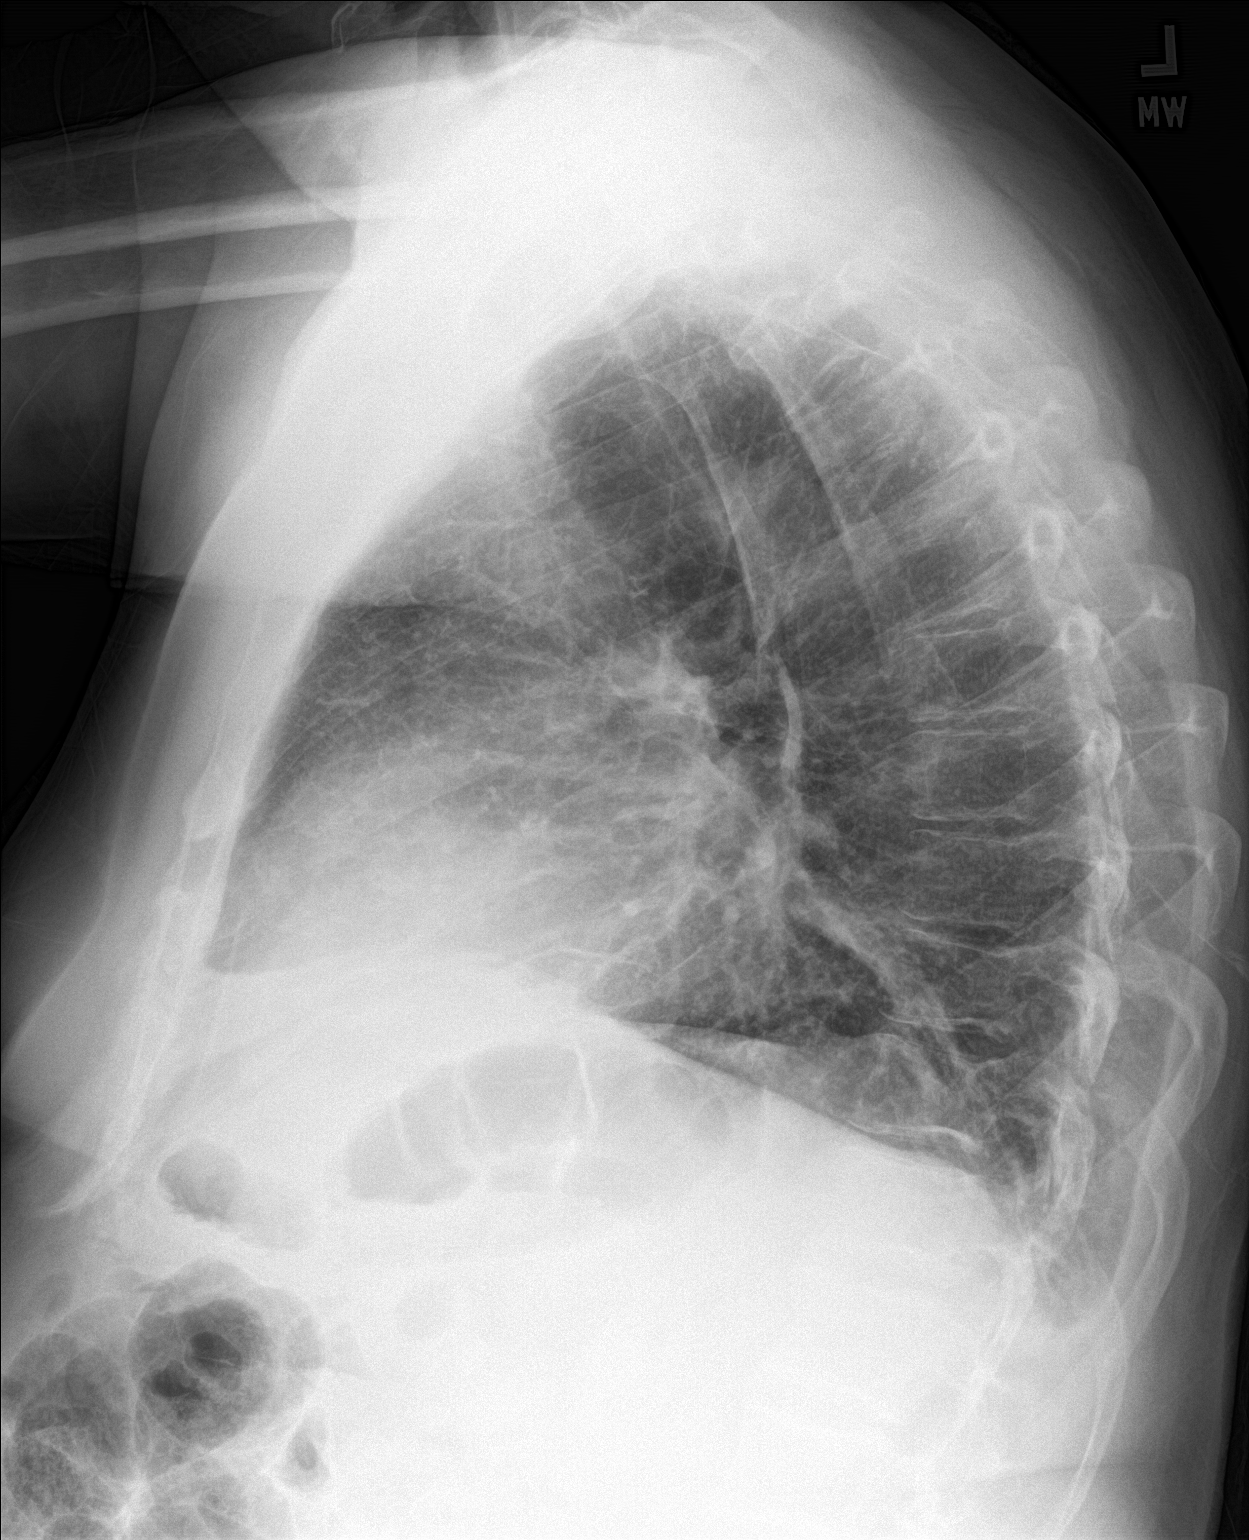

[chest pa (2 of 2)]
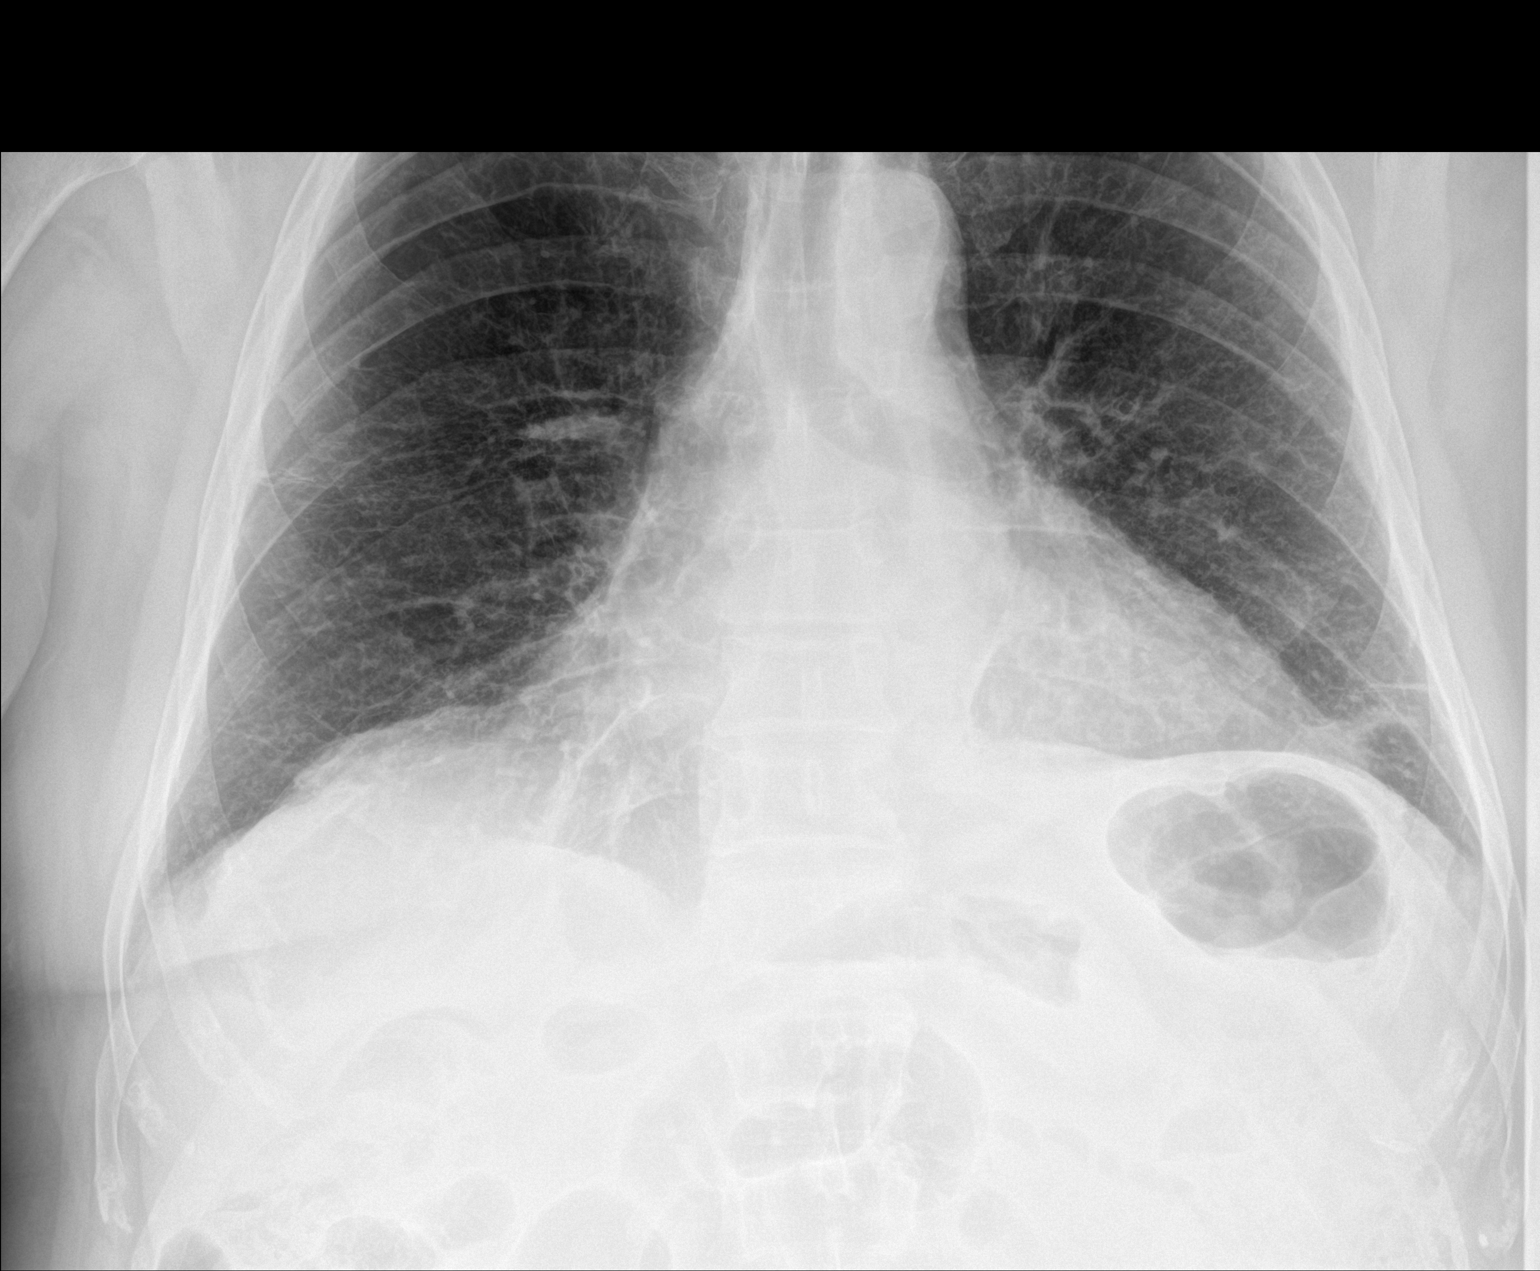

[3 of 3 positions shown; findings below may reference images not displayed]

FINDINGS: The heart size is normal. No pleural effusion or edema. New
atelectasis or scarring identified within both lung bases.
IMPRESSION: 1. New scar or atelectasis noted within the lung bases.

## 2019-03-27 DIAGNOSIS — I2510 Atherosclerotic heart disease of native coronary artery without angina pectoris: Secondary | ICD-10-CM | POA: Diagnosis not present

## 2019-03-27 DIAGNOSIS — I2111 ST elevation (STEMI) myocardial infarction involving right coronary artery: Secondary | ICD-10-CM | POA: Diagnosis not present

## 2019-04-07 ENCOUNTER — Other Ambulatory Visit: Payer: Medicare PPO

## 2019-04-08 ENCOUNTER — Other Ambulatory Visit: Payer: Self-pay

## 2019-04-08 ENCOUNTER — Other Ambulatory Visit: Payer: Medicare PPO

## 2019-04-08 DIAGNOSIS — N183 Chronic kidney disease, stage 3 unspecified: Secondary | ICD-10-CM

## 2019-04-08 DIAGNOSIS — Z1159 Encounter for screening for other viral diseases: Secondary | ICD-10-CM

## 2019-04-08 DIAGNOSIS — R7303 Prediabetes: Secondary | ICD-10-CM

## 2019-04-09 ENCOUNTER — Other Ambulatory Visit: Payer: Self-pay

## 2019-04-09 ENCOUNTER — Other Ambulatory Visit: Payer: Self-pay | Admitting: Family Medicine

## 2019-04-09 ENCOUNTER — Ambulatory Visit (INDEPENDENT_AMBULATORY_CARE_PROVIDER_SITE_OTHER): Payer: Medicare PPO | Admitting: Family Medicine

## 2019-04-09 ENCOUNTER — Encounter: Payer: Self-pay | Admitting: Family Medicine

## 2019-04-09 DIAGNOSIS — R7303 Prediabetes: Secondary | ICD-10-CM

## 2019-04-09 DIAGNOSIS — N183 Chronic kidney disease, stage 3 unspecified: Secondary | ICD-10-CM

## 2019-04-09 DIAGNOSIS — E785 Hyperlipidemia, unspecified: Secondary | ICD-10-CM

## 2019-04-09 DIAGNOSIS — I129 Hypertensive chronic kidney disease with stage 1 through stage 4 chronic kidney disease, or unspecified chronic kidney disease: Secondary | ICD-10-CM

## 2019-04-09 DIAGNOSIS — E875 Hyperkalemia: Secondary | ICD-10-CM

## 2019-04-09 DIAGNOSIS — Z0000 Encounter for general adult medical examination without abnormal findings: Secondary | ICD-10-CM

## 2019-04-09 DIAGNOSIS — R351 Nocturia: Secondary | ICD-10-CM

## 2019-04-09 LAB — BASIC METABOLIC PANEL WITH GFR
BUN/Creatinine Ratio: 12 (calc) (ref 6–22)
BUN: 19 mg/dL (ref 7–25)
CO2: 26 mmol/L (ref 20–32)
Calcium: 9.5 mg/dL (ref 8.6–10.3)
Chloride: 109 mmol/L (ref 98–110)
Creat: 1.62 mg/dL — ABNORMAL HIGH (ref 0.70–1.18)
GFR, Est African American: 49 mL/min/{1.73_m2} — ABNORMAL LOW (ref >=60)
GFR, Est Non African American: 42 mL/min/{1.73_m2} — ABNORMAL LOW (ref >=60)
Glucose, Bld: 92 mg/dL (ref 65–99)
Potassium: 5.4 mmol/L — ABNORMAL HIGH (ref 3.5–5.3)
Sodium: 143 mmol/L (ref 135–146)

## 2019-04-09 LAB — HEPATITIS C ANTIBODY
Hepatitis C Ab: NONREACTIVE
SIGNAL TO CUT-OFF: 0.01 (ref <1.00)

## 2019-04-09 LAB — HEMOGLOBIN A1C
Hgb A1c MFr Bld: 6.3 % of total Hgb — ABNORMAL HIGH (ref <5.7)
Mean Plasma Glucose: 134 (calc)
eAG (mmol/L): 7.4 (calc)

## 2019-04-09 NOTE — Patient Instructions (Addendum)
Thank you for coming to the office today.  Limit bananas and K rich foods  Improve hydration, low sugar diet, inc exercise  DUE for FASTING BLOOD WORK (no food or drink after midnight before the lab appointment, only water or coffee without cream/sugar on the morning of)  SCHEDULE "Lab Only" visit in the morning at the clinic for lab draw in 6 MONTHS   - Make sure Lab Only appointment is at about 1 week before your next appointment, so that results will be available  For Lab Results, once available within 2-3 days of blood draw, you can can log in to MyChart online to view your results and a brief explanation. Also, we can discuss results at next follow-up visit.   Please schedule a Follow-up Appointment to: Return in about 6 months (around 10/10/2019) for Annual Physical.  If you have any other questions or concerns, please feel free to call the office or send a message through [place]. You may also schedule an earlier appointment if necessary.  Additionally, you may be receiving a survey about your experience at our office within a few days to 1 week by e-mail or mail. We value your feedback.  [name], DO [place]

## 2019-04-09 NOTE — Assessment & Plan Note (Signed)
Mild improved CKD with Cr 1.62, and GFR 42 At risk progression to CKD-IV, in setting of HTN, PreDM, age 73 and former history of ibuprofen use but no longer on NSAID >1 year  Plan: 1. Continue to control BP, A1c - both improved 2. Continue to improve hydration 3. Avoid NSAIDs 4. Reviewed potential for referral to Nephrology - again deferred by their preference, will monitor labs and progress still, and if other changes will refer if inc Cr or uncontrol BP 5. Follow-up 6 months Annual + Labs

## 2019-04-09 NOTE — Assessment & Plan Note (Signed)
Controlled HTN by report - Home BP readings normal Complication with CKD-III close to progression CKD-IV   Plan:  1. Continue current BP regimen - Metoprolol 25mg  BID, Losartan 25mg  daily - continue ARB with CKD for now - Caution with Hyperkalemia - advised modify diet to reduce K rich foods bananas for now - recheck 2. Encourage improved lifestyle - low sodium diet, regular exercise 3. Continue monitor BP outside office, bring readings to next visit, if persistently >140/90 or new symptoms notify office sooner 4. Follow-up 6 months Annual + labs

## 2019-04-09 NOTE — Progress Notes (Signed)
Virtual Visit via Telephone The purpose of this virtual visit is to provide medical care while limiting exposure to the novel coronavirus (COVID19) for both patient and office staff.  Consent was obtained for phone visit:  Yes.   Answered questions that patient had about telehealth interaction:  Yes.   I discussed the limitations, risks, security and privacy concerns of performing an evaluation and management service by telephone. I also discussed with the patient that there may be a patient responsible charge related to this service. The patient expressed understanding and agreed to proceed.  Patient Location: Home Provider Location: [facility])  ---------------------------------------------------------------------- Chief Complaint  Patient presents with  . Pre-Diabetes  . Hypertension  . Chronic Kidney Disease    S: Reviewed CMA documentation. I have called patient, spoke with [name] and Blanch Media, gathered additional HPI as follows:  Pre-Diabetes: Last lab result yesterday 04/08/19 6.3, previous 5.9 to 6.2 in past CBGs: Does not check CBG Meds: No medication(never on med) Currently on ARB Lifestyle: -Diet (Not adhering to low carb low sugar diet recently) -Exercise (working outdoors, when weather nice, but not always exercising with walking) - Taking ASA 81mg , Statin Denies hypoglycemia, polyuria, visual changes, numbness or tingling.  CHRONIC HTN / CKD-III / Hyperkalemia Known chronic CKD-III GFR 39-45 range, Cr 1.7 approx Last lab showed improved Cr to 1.6, with GFR 42. Mild elevated K 5.4 Reports no concerns.Home BP readings are normal range Current Meds - Metoprolol 25mg  BID, Losartan 25mg  Reports good compliance, took meds today. Tolerating well, w/o complaints. - Tries to stay well hydrated with water. Admits had eaten many bananas lately Denies CP, dyspnea, HA, edema, dizziness / lightheadedness  Past Medical History:  Diagnosis Date  . BCC  (basal cell carcinoma)    x 2 on face  . COPD (chronic obstructive pulmonary disease) ([place])   . Hyperlipidemia   . Hypertension   . Melanoma ([place])   . Myocardial infarction ([place]) 10/2014  . SCCA (squamous cell carcinoma) of skin    SCC x 1 on face   Social History   Tobacco Use  . Smoking status: Former Smoker    Packs/day: 2.00    Types: Cigarettes    Last attempt to quit: 10/19/2014    Years since quitting: 4.4  . Smokeless tobacco: Never Used  Substance Use Topics  . Alcohol use: No  . Drug use: No    Current Outpatient Medications:  .  albuterol (PROVENTIL HFA;VENTOLIN HFA) 108 (90 BASE) MCG/ACT inhaler, Inhale 2 puffs into the lungs every 4 (four) hours as needed for wheezing or shortness of breath (or shortness of breath)., Disp: 3 Inhaler, Rfl: 3 .  aspirin 81 MG EC tablet, TAKE 1 TABLET EVERY DAY, Disp: 100 tablet, Rfl: 3 .  atorvastatin (LIPITOR) 80 MG tablet, Take 80 mg by mouth daily at 6 PM. , Disp: , Rfl:  .  ezetimibe (ZETIA) 10 MG tablet, Take 10 mg by mouth daily. , Disp: , Rfl:  .  losartan (COZAAR) 25 MG tablet, TAKE 1 TABLET EVERY DAY, Disp: 90 tablet, Rfl: 3 .  metoprolol tartrate (LOPRESSOR) 25 MG tablet, Take 25 mg by mouth 2 (two) times daily. , Disp: , Rfl:  .  Multiple Vitamins-Minerals (MULTIVITAMIN MEN 50+) TABS, Take daily by mouth., Disp: , Rfl:  .  nitroGLYCERIN (NITROSTAT) 0.4 MG SL tablet, Place 0.4 mg under the tongue every 5 (five) minutes as needed for chest pain. AS NEEDED, Disp: , Rfl:  .  omeprazole (  PRILOSEC) 40 MG capsule, TAKE 1 CAPSULE EVERY DAY, Disp: 90 capsule, Rfl: 3 .  sertraline (ZOLOFT) 50 MG tablet, TAKE 1 TABLET EVERY DAY, Disp: 90 tablet, Rfl: 3 .  tiotropium (SPIRIVA) 18 MCG inhalation capsule, Place 18 mcg into inhaler and inhale every other day. , Disp: , Rfl:  .  budesonide-formoterol (SYMBICORT) 160-4.5 MCG/ACT inhaler, Inhale 2 puffs twice daily (Patient not taking: Reported on 04/09/2019), Disp: 1 Inhaler, Rfl:  7  Depression screen [facility] 2/9 04/09/2019 10/07/2018 05/28/2018  Decreased Interest 0 0 3  Down, Depressed, Hopeless 0 0 0  PHQ - 2 Score 0 0 3  Altered sleeping - - 3  Tired, decreased energy - - 1  Change in appetite - - 1  Feeling bad or failure about yourself  - - 0  Trouble concentrating - - 0  Moving slowly or fidgety/restless - - 0  Suicidal thoughts - - 0  PHQ-9 Score - - 8  Difficult doing work/chores - - Not difficult at all    No flowsheet data found.  -------------------------------------------------------------------------- O: No physical exam performed due to remote telephone encounter.  Lab results reviewed.  Recent Labs    09/30/18 1057 04/08/19 0824  HGBA1C 6.2* 6.3*    Recent Results (from the past 2160 hour(s))  BASIC METABOLIC PANEL WITH GFR     Status: Abnormal   Collection Time: 04/08/19  8:24 AM  Result Value Ref Range   Glucose, Bld 92 65 - 99 mg/dL    Comment: .            Fasting reference interval .    BUN 19 7 - 25 mg/dL   Creat 1.62 (H) 0.70 - 1.18 mg/dL    Comment: For patients >39 years of age, the reference limit for Creatinine is approximately 13% higher for people identified as African-American. .    GFR, Est Non African American 42 (L) > OR = 60 mL/min/1.32m2   GFR, Est African American 49 (L) > OR = 60 mL/min/1.17m2   BUN/Creatinine Ratio 12 6 - 22 (calc)   Sodium 143 135 - 146 mmol/L   Potassium 5.4 (H) 3.5 - 5.3 mmol/L   Chloride 109 98 - 110 mmol/L   CO2 26 20 - 32 mmol/L   Calcium 9.5 8.6 - 10.3 mg/dL  Hemoglobin A1c     Status: Abnormal   Collection Time: 04/08/19  8:24 AM  Result Value Ref Range   Hgb A1c MFr Bld 6.3 (H) <5.7 % of total Hgb    Comment: For someone without known diabetes, a hemoglobin  A1c value between 5.7% and 6.4% is consistent with prediabetes and should be confirmed with a  follow-up test. . For someone with known diabetes, a value <7% indicates that their diabetes is well controlled.  A1c targets should be individualized based on duration of diabetes, age, comorbid conditions, and other considerations. . This assay result is consistent with an increased risk of diabetes. . Currently, no consensus exists regarding use of hemoglobin A1c for diagnosis of diabetes for children. .    Mean Plasma Glucose 134 (calc)   eAG (mmol/L) 7.4 (calc)    -------------------------------------------------------------------------- A&P:  Problem List Items Addressed This Visit    Benign hypertension with CKD (chronic kidney disease) stage III (HCC)    Controlled HTN by report - Home BP readings normal Complication with CKD-III close to progression CKD-IV   Plan:  1. Continue current BP regimen - Metoprolol 25mg  BID, Losartan 25mg  daily - continue  ARB with CKD for now - Caution with Hyperkalemia - advised modify diet to reduce K rich foods bananas for now - recheck 2. Encourage improved lifestyle - low sodium diet, regular exercise 3. Continue monitor BP outside office, bring readings to next visit, if persistently >140/90 or new symptoms notify office sooner 4. Follow-up 6 months Annual + labs      CKD (chronic kidney disease), stage III (HCC)    Mild improved CKD with Cr 1.62, and GFR 42 At risk progression to CKD-IV, in setting of HTN, PreDM, age 73 and former history of ibuprofen use but no longer on NSAID >1 year  Plan: 1. Continue to control BP, A1c - both improved 2. Continue to improve hydration 3. Avoid NSAIDs 4. Reviewed potential for referral to Nephrology - again deferred by their preference, will monitor labs and progress still, and if other changes will refer if inc Cr or uncontrol BP 5. Follow-up 6 months Annual + Labs      Pre-diabetes - Primary    Worsening control Pre-DM with A1c 6.3  Plan:  1. Not on any therapy currently  2. Encourage improved lifestyle - low carb, low sugar diet, reduce portion size, continue improving regular exercise 3.  Follow-up 6 mo        Other Visit Diagnoses    Hyperkalemia        - REDUCE dietary K in foods, avoid bananas Continue low dose ARB for now Re-check lab in 6 months   No orders of the defined types were placed in this encounter.   Follow-up: - Return in 6 months for Annual Physical - Future labs ordered for 09/18/19  Patient verbalizes understanding with the above medical recommendations including the limitation of remote medical advice.  Specific follow-up and call-back criteria were given for patient to follow-up or seek medical care more urgently if needed.   - Time spent in direct consultation with patient on phone: 11 minutes   [name], [facility] 04/09/2019, 9:17 AM

## 2019-04-09 NOTE — Assessment & Plan Note (Signed)
Worsening control Pre-DM with A1c 6.3  Plan:  1. Not on any therapy currently  2. Encourage improved lifestyle - low carb, low sugar diet, reduce portion size, continue improving regular exercise 3. Follow-up 6 mo

## 2019-04-18 ENCOUNTER — Other Ambulatory Visit: Payer: Self-pay | Admitting: Family Medicine

## 2019-04-18 DIAGNOSIS — F329 Major depressive disorder, single episode, unspecified: Secondary | ICD-10-CM

## 2019-04-18 DIAGNOSIS — I10 Essential (primary) hypertension: Secondary | ICD-10-CM

## 2019-04-18 DIAGNOSIS — F32A Depression, unspecified: Secondary | ICD-10-CM

## 2019-04-18 DIAGNOSIS — K219 Gastro-esophageal reflux disease without esophagitis: Secondary | ICD-10-CM

## 2019-04-18 DIAGNOSIS — I2510 Atherosclerotic heart disease of native coronary artery without angina pectoris: Secondary | ICD-10-CM

## 2019-09-18 ENCOUNTER — Other Ambulatory Visit: Payer: Self-pay

## 2019-09-18 ENCOUNTER — Other Ambulatory Visit: Payer: Medicare PPO

## 2019-09-18 DIAGNOSIS — E785 Hyperlipidemia, unspecified: Secondary | ICD-10-CM

## 2019-09-18 DIAGNOSIS — R7303 Prediabetes: Secondary | ICD-10-CM

## 2019-09-18 DIAGNOSIS — N183 Chronic kidney disease, stage 3 unspecified: Secondary | ICD-10-CM

## 2019-09-18 DIAGNOSIS — R351 Nocturia: Secondary | ICD-10-CM

## 2019-09-18 DIAGNOSIS — I129 Hypertensive chronic kidney disease with stage 1 through stage 4 chronic kidney disease, or unspecified chronic kidney disease: Secondary | ICD-10-CM

## 2019-09-18 DIAGNOSIS — Z0000 Encounter for general adult medical examination without abnormal findings: Secondary | ICD-10-CM | POA: Diagnosis not present

## 2019-09-19 LAB — CBC WITH DIFFERENTIAL/PLATELET
Absolute Monocytes: 658 cells/uL (ref 200–950)
Basophils Absolute: 49 cells/uL (ref 0–200)
Basophils Relative: 0.7 %
Eosinophils Absolute: 861 cells/uL — ABNORMAL HIGH (ref 15–500)
Eosinophils Relative: 12.3 %
HCT: 38.1 % — ABNORMAL LOW (ref 38.5–50.0)
Hemoglobin: 12.6 g/dL — ABNORMAL LOW (ref 13.2–17.1)
Lymphs Abs: 1505 cells/uL (ref 850–3900)
MCH: 30.1 pg (ref 27.0–33.0)
MCHC: 33.1 g/dL (ref 32.0–36.0)
MCV: 91.1 fL (ref 80.0–100.0)
MPV: 10.4 fL (ref 7.5–12.5)
Monocytes Relative: 9.4 %
Neutro Abs: 3927 cells/uL (ref 1500–7800)
Neutrophils Relative %: 56.1 %
Platelets: 160 10*3/uL (ref 140–400)
RBC: 4.18 10*6/uL — ABNORMAL LOW (ref 4.20–5.80)
RDW: 12.8 % (ref 11.0–15.0)
Total Lymphocyte: 21.5 %
WBC: 7 10*3/uL (ref 3.8–10.8)

## 2019-09-19 LAB — COMPLETE METABOLIC PANEL WITH GFR
AG Ratio: 1.7 (calc) (ref 1.0–2.5)
ALT: 17 U/L (ref 9–46)
AST: 23 U/L (ref 10–35)
Albumin: 4 g/dL (ref 3.6–5.1)
Alkaline phosphatase (APISO): 85 U/L (ref 35–144)
BUN/Creatinine Ratio: 15 (calc) (ref 6–22)
BUN: 21 mg/dL (ref 7–25)
CO2: 28 mmol/L (ref 20–32)
Calcium: 9.1 mg/dL (ref 8.6–10.3)
Chloride: 109 mmol/L (ref 98–110)
Creat: 1.43 mg/dL — ABNORMAL HIGH (ref 0.70–1.18)
GFR, Est African American: 57 mL/min/{1.73_m2} — ABNORMAL LOW (ref >=60)
GFR, Est Non African American: 49 mL/min/{1.73_m2} — ABNORMAL LOW (ref >=60)
Globulin: 2.4 g/dL (calc) (ref 1.9–3.7)
Glucose, Bld: 113 mg/dL — ABNORMAL HIGH (ref 65–99)
Potassium: 5.2 mmol/L (ref 3.5–5.3)
Sodium: 142 mmol/L (ref 135–146)
Total Bilirubin: 0.4 mg/dL (ref 0.2–1.2)
Total Protein: 6.4 g/dL (ref 6.1–8.1)

## 2019-09-19 LAB — LIPID PANEL
Cholesterol: 134 mg/dL (ref <200)
HDL: 32 mg/dL — ABNORMAL LOW (ref >=40)
LDL Cholesterol (Calc): 68 mg/dL (calc)
Non-HDL Cholesterol (Calc): 102 mg/dL (calc) (ref <130)
Total CHOL/HDL Ratio: 4.2 (calc) (ref <5.0)
Triglycerides: 246 mg/dL — ABNORMAL HIGH (ref <150)

## 2019-09-19 LAB — HEMOGLOBIN A1C
Hgb A1c MFr Bld: 6.3 % of total Hgb — ABNORMAL HIGH (ref <5.7)
Mean Plasma Glucose: 134 (calc)
eAG (mmol/L): 7.4 (calc)

## 2019-09-19 LAB — PSA: PSA: 2 ng/mL (ref <=4.0)

## 2019-09-25 ENCOUNTER — Other Ambulatory Visit: Payer: Self-pay | Admitting: Family Medicine

## 2019-09-25 ENCOUNTER — Ambulatory Visit (INDEPENDENT_AMBULATORY_CARE_PROVIDER_SITE_OTHER): Payer: Medicare PPO | Admitting: Family Medicine

## 2019-09-25 ENCOUNTER — Encounter: Payer: Self-pay | Admitting: Family Medicine

## 2019-09-25 ENCOUNTER — Other Ambulatory Visit: Payer: Self-pay

## 2019-09-25 VITALS — BP 114/57 | HR 51 | Temp 98.6°F | Resp 16 | Ht 71.0 in | Wt 233.0 lb

## 2019-09-25 DIAGNOSIS — Z0000 Encounter for general adult medical examination without abnormal findings: Secondary | ICD-10-CM

## 2019-09-25 DIAGNOSIS — N1831 Chronic kidney disease, stage 3a: Secondary | ICD-10-CM

## 2019-09-25 DIAGNOSIS — R7303 Prediabetes: Secondary | ICD-10-CM | POA: Diagnosis not present

## 2019-09-25 DIAGNOSIS — F325 Major depressive disorder, single episode, in full remission: Secondary | ICD-10-CM | POA: Diagnosis not present

## 2019-09-25 DIAGNOSIS — I2510 Atherosclerotic heart disease of native coronary artery without angina pectoris: Secondary | ICD-10-CM | POA: Diagnosis not present

## 2019-09-25 DIAGNOSIS — J431 Panlobular emphysema: Secondary | ICD-10-CM | POA: Diagnosis not present

## 2019-09-25 DIAGNOSIS — Z23 Encounter for immunization: Secondary | ICD-10-CM

## 2019-09-25 DIAGNOSIS — I129 Hypertensive chronic kidney disease with stage 1 through stage 4 chronic kidney disease, or unspecified chronic kidney disease: Secondary | ICD-10-CM

## 2019-09-25 DIAGNOSIS — E785 Hyperlipidemia, unspecified: Secondary | ICD-10-CM | POA: Diagnosis not present

## 2019-09-25 DIAGNOSIS — J439 Emphysema, unspecified: Secondary | ICD-10-CM | POA: Diagnosis not present

## 2019-09-25 DIAGNOSIS — N183 Chronic kidney disease, stage 3 unspecified: Secondary | ICD-10-CM

## 2019-09-25 DIAGNOSIS — R0600 Dyspnea, unspecified: Secondary | ICD-10-CM | POA: Diagnosis not present

## 2019-09-25 NOTE — Patient Instructions (Addendum)
Thank you for coming to the office today.  Keep up the good work overall !   DUE for FASTING BLOOD WORK (no food or drink after midnight before the lab appointment, only water or coffee without cream/sugar on the morning of)  SCHEDULE "Lab Only" visit in the morning at the clinic for lab draw in 6 MONTHS   - Make sure Lab Only appointment is at about 1 week before your next appointment, so that results will be available  For Lab Results, once available within 2-3 days of blood draw, you can can log in to MyChart online to view your results and a brief explanation. Also, we can discuss results at next follow-up visit.   Please schedule a Follow-up Appointment to: Return in about 6 months (around 03/24/2020) for 6 month follow-up Lab results, HTN CKD.  If you have any other questions or concerns, please feel free to call the office or send a message through [place]. You may also schedule an earlier appointment if necessary.  Additionally, you may be receiving a survey about your experience at our office within a few days to 1 week by e-mail or mail. We value your feedback.  [name], DO [place]

## 2019-09-25 NOTE — Progress Notes (Signed)
Subjective:    Patient ID: George Jensen, male    DOB: [birth year], 73 y.o.   MRN: [id number]  George Jensen is a 73 y.o. male presenting on 09/25/2019 for Annual Exam   HPI   Here for Annual Physical and Lab Review  Pre-Diabetes: Last lab result 6.3 CBGs: Does not check CBG Meds: No medication(never on med) Currently on ARB Lifestyle: -Diet (improved diet but still eats some higher carb foods, bananas) -Exercise (working outdoors, when weather nice, but not always exercising with walking) - Taking ASA 81mg , Statin Denies hypoglycemia, polyuria, visual changes, numbness or tingling.  CHRONIC HTN / CKD-III / Hyperkalemia / CAD / HLD Known chronic CKD-III - improved Cr and GFR on last lab. Resolved HyperK Reports no concerns.Home BP readings are normal range Current Meds - Metoprolol 25mg  BID, Losartan 25mg  Reports good compliance, took meds today. Tolerating well, w/o complaints. - Tries to stay well hydrated with water. Admits had eaten many bananas lately Denies CP, dyspnea, HA, edema, dizziness / lightheadedness  Mild Anemia, Normocytic CBC lab shows Hgb improved He denies symptoms  COPD, Former Smoker Recently been back to Dr Raul Del [facility]m - Off symbicort Now off Spiriva unless PRN wheezing Dr Raul Del today - Rarely using Albuterol PRN  Major Depression in Remission No new concerns, see PHQ On Sertraline  Health Maintenance: UTD PNA vaccine series  Due for Flu Shot, already received today  Depression screen [facility] 2/9 09/25/2019 04/09/2019 10/07/2018  Decreased Interest 0 0 0  Down, Depressed, Hopeless 0 0 0  PHQ - 2 Score 0 0 0  Altered sleeping 1 - -  Tired, decreased energy 1 - -  Change in appetite 0 - -  Feeling bad or failure about yourself  0 - -  Trouble concentrating 1 - -  Moving slowly or fidgety/restless 0 - -  Suicidal thoughts 0 - -  PHQ-9 Score 3 - -  Difficult doing work/chores Somewhat difficult - -    Past Medical  History:  Diagnosis Date  . BCC (basal cell carcinoma)    x 2 on face  . COPD (chronic obstructive pulmonary disease) ([place])   . Hyperlipidemia   . Hypertension   . Melanoma ([place])   . Myocardial infarction ([place]) 10/2014  . SCCA (squamous cell carcinoma) of skin    SCC x 1 on face   Past Surgical History:  Procedure Laterality Date  . CAROTID STENT  10/2014   3 STENTS [facility]  . MELANOMA EXCISION  1982   Social History   Socioeconomic History  . Marital status: Married    Spouse name: Piers Summerson  . Number of children: 4  . Years of education: Not on file  . Highest education level: 9th grade  Occupational History  . Occupation: Therapist, occupational  Social Needs  . Financial resource strain: Not hard at all  . Food insecurity    Worry: Never true    Inability: Never true  . Transportation needs    Medical: No    Non-medical: No  Tobacco Use  . Smoking status: Former Smoker    Packs/day: 2.00    Types: Cigarettes    Quit date: 10/19/2014    Years since quitting: 4.9  . Smokeless tobacco: Former Network engineer and Sexual Activity  . Alcohol use: No  . Drug use: No  . Sexual activity: Not on file  Lifestyle  . Physical activity    Days per week: 0 days  Minutes per session: 0 min  . Stress: Not at all  Relationships  . Social Herbalist on phone: Patient refused    Gets together: Patient refused    Attends religious service: Patient refused    Active member of club or organization: Patient refused    Attends meetings of clubs or organizations: Patient refused    Relationship status: Patient refused  . Intimate partner violence    Fear of current or ex partner: Patient refused    Emotionally abused: Patient refused    Physically abused: Patient refused    Forced sexual activity: Patient refused  Other Topics Concern  . Not on file  Social History Narrative  . Not on file   Family History  Problem Relation Age of Onset  .  Alzheimer's disease Mother   . Diabetes Father   . Heart disease Father    Current Outpatient Medications on File Prior to Visit  Medication Sig  . albuterol (PROVENTIL HFA;VENTOLIN HFA) 108 (90 BASE) MCG/ACT inhaler Inhale 2 puffs into the lungs every 4 (four) hours as needed for wheezing or shortness of breath (or shortness of breath).  Marland Kitchen aspirin 81 MG EC tablet TAKE 1 TABLET EVERY DAY  . atorvastatin (LIPITOR) 80 MG tablet Take 80 mg by mouth daily at 6 PM.   . budesonide-formoterol (SYMBICORT) 160-4.5 MCG/ACT inhaler Inhale 2 puffs twice daily  . losartan (COZAAR) 25 MG tablet TAKE 1 TABLET EVERY DAY  . metoprolol tartrate (LOPRESSOR) 25 MG tablet Take 25 mg by mouth 2 (two) times daily.   . Multiple Vitamins-Minerals (MULTIVITAMIN MEN 50+) TABS Take daily by mouth.  . nitroGLYCERIN (NITROSTAT) 0.4 MG SL tablet Place 0.4 mg under the tongue every 5 (five) minutes as needed for chest pain. AS NEEDED  . omeprazole (PRILOSEC) 40 MG capsule TAKE 1 CAPSULE EVERY DAY  . sertraline (ZOLOFT) 50 MG tablet TAKE 1 TABLET EVERY DAY  . ezetimibe (ZETIA) 10 MG tablet Take 10 mg by mouth daily.   Marland Kitchen tiotropium (SPIRIVA) 18 MCG inhalation capsule Place 18 mcg into inhaler and inhale every other day.    No current facility-administered medications on file prior to visit.     Review of Systems  Constitutional: Negative for activity change, appetite change, chills, diaphoresis, fatigue and fever.  HENT: Negative for congestion and hearing loss.   Eyes: Negative for visual disturbance.  Respiratory: Negative for apnea, cough, choking, chest tightness, shortness of breath and wheezing.   Cardiovascular: Negative for chest pain, palpitations and leg swelling.  Gastrointestinal: Negative for abdominal pain, anal bleeding, blood in stool, constipation, diarrhea, nausea and vomiting.  Endocrine: Negative for cold intolerance.  Genitourinary: Negative for difficulty urinating, dysuria, frequency and  hematuria.  Musculoskeletal: Negative for arthralgias, back pain and neck pain.  Skin: Negative for rash.  Allergic/Immunologic: Negative for environmental allergies.  Neurological: Negative for dizziness, weakness, light-headedness, numbness and headaches.  Hematological: Negative for adenopathy.  Psychiatric/Behavioral: Negative for behavioral problems, dysphoric mood and sleep disturbance. The patient is not nervous/anxious.    Per HPI unless specifically indicated above     Objective:    BP (!) 114/57   Pulse (!) 51   Temp 98.6 F (37 C) (Oral)   Resp 16   Ht 5\' 11"  (1.803 m)   Wt 233 lb (105.7 kg)   BMI 32.50 kg/m   Wt Readings from Last 3 Encounters:  09/25/19 233 lb (105.7 kg)  10/07/18 229 lb 12.8 oz (104.2 kg)  10/07/18  229 lb 12.8 oz (104.2 kg)    Physical Exam Vitals signs and nursing note reviewed.  Constitutional:      General: He is not in acute distress.    Appearance: He is well-developed. He is not diaphoretic.     Comments: Well-appearing, comfortable, cooperative  HENT:     Head: Normocephalic and atraumatic.  Eyes:     General:        Right eye: No discharge.        Left eye: No discharge.     Conjunctiva/sclera: Conjunctivae normal.     Pupils: Pupils are equal, round, and reactive to light.  Neck:     Musculoskeletal: Normal range of motion and neck supple.     Thyroid: No thyromegaly.  Cardiovascular:     Rate and Rhythm: Normal rate and regular rhythm.     Heart sounds: Normal heart sounds. No murmur.  Pulmonary:     Effort: Pulmonary effort is normal. No respiratory distress.     Breath sounds: Normal breath sounds. No wheezing or rales.  Abdominal:     General: Bowel sounds are normal. There is no distension.     Palpations: Abdomen is soft. There is no mass.     Tenderness: There is no abdominal tenderness.  Musculoskeletal: Normal range of motion.        General: No tenderness.     Comments: Upper / Lower Extremities: - Normal  muscle tone, strength bilateral upper extremities 5/5, lower extremities 5/5  Lymphadenopathy:     Cervical: No cervical adenopathy.  Skin:    General: Skin is warm and dry.     Findings: No erythema or rash.  Neurological:     Mental Status: He is alert and oriented to person, place, and time.     Comments: Distal sensation intact to light touch all extremities  Psychiatric:        Behavior: Behavior normal.     Comments: Well groomed, good eye contact, normal speech and thoughts      Results for orders placed or performed in visit on 09/18/19  PSA  Result Value Ref Range   PSA 2.0 < OR = 4.0 ng/mL  Lipid panel  Result Value Ref Range   Cholesterol 134 <200 mg/dL   HDL 32 (L) > OR = 40 mg/dL   Triglycerides 246 (H) <150 mg/dL   LDL Cholesterol (Calc) 68 mg/dL (calc)   Total CHOL/HDL Ratio 4.2 <5.0 (calc)   Non-HDL Cholesterol (Calc) 102 <130 mg/dL (calc)  COMPLETE METABOLIC PANEL WITH GFR  Result Value Ref Range   Glucose, Bld 113 (H) 65 - 99 mg/dL   BUN 21 7 - 25 mg/dL   Creat 1.43 (H) 0.70 - 1.18 mg/dL   GFR, Est Non African American 49 (L) > OR = 60 mL/min/1.56m2   GFR, Est African American 57 (L) > OR = 60 mL/min/1.73m2   BUN/Creatinine Ratio 15 6 - 22 (calc)   Sodium 142 135 - 146 mmol/L   Potassium 5.2 3.5 - 5.3 mmol/L   Chloride 109 98 - 110 mmol/L   CO2 28 20 - 32 mmol/L   Calcium 9.1 8.6 - 10.3 mg/dL   Total Protein 6.4 6.1 - 8.1 g/dL   Albumin 4.0 3.6 - 5.1 g/dL   Globulin 2.4 1.9 - 3.7 g/dL (calc)   AG Ratio 1.7 1.0 - 2.5 (calc)   Total Bilirubin 0.4 0.2 - 1.2 mg/dL   Alkaline phosphatase (APISO) 85 35 - 144 U/L  AST 23 10 - 35 U/L   ALT 17 9 - 46 U/L  CBC with Differential/Platelet  Result Value Ref Range   WBC 7.0 3.8 - 10.8 Thousand/uL   RBC 4.18 (L) 4.20 - 5.80 Million/uL   Hemoglobin 12.6 (L) 13.2 - 17.1 g/dL   HCT 38.1 (L) 38.5 - 50.0 %   MCV 91.1 80.0 - 100.0 fL   MCH 30.1 27.0 - 33.0 pg   MCHC 33.1 32.0 - 36.0 g/dL   RDW 12.8 11.0 - 15.0 %    Platelets 160 140 - 400 Thousand/uL   MPV 10.4 7.5 - 12.5 fL   Neutro Abs 3,927 1,500 - 7,800 cells/uL   Lymphs Abs 1,505 850 - 3,900 cells/uL   Absolute Monocytes 658 200 - 950 cells/uL   Eosinophils Absolute 861 (H) 15 - 500 cells/uL   Basophils Absolute 49 0 - 200 cells/uL   Neutrophils Relative % 56.1 %   Total Lymphocyte 21.5 %   Monocytes Relative 9.4 %   Eosinophils Relative 12.3 %   Basophils Relative 0.7 %  Hemoglobin A1c  Result Value Ref Range   Hgb A1c MFr Bld 6.3 (H) <5.7 % of total Hgb   Mean Plasma Glucose 134 (calc)   eAG (mmol/L) 7.4 (calc)      Assessment & Plan:   Problem List Items Addressed This Visit    Pulmonary emphysema (HCC)    Stable, without flare Followed by Dr Minerva Areola Pulm, yearly Albuterol PRN Remains smoke free Off symbicort and now spiriva is PRN      Pre-diabetes    Controlled PreDM Concern with HTN, CKD-III  Plan:  1. Not on any therapy currently - remain off 2. Encourage improved lifestyle - low carb, low sugar diet, reduce portion size, continue improving regular exercise 3. Follow-up 6 month lab      Major depression in remission ([place])    STable controlled in remission On Sertraline 50mg       Dyslipidemia    Lipids nearly at goal but now some elevated LDL >70 Elevated TG >200  Emphasis on diet management and continue current cholesterol therapy      CKD (chronic kidney disease), stage III    Mild improved CKD with Cr 1.4 At risk progression to CKD-IV, in setting of HTN, PreDM, age 10 and former history of ibuprofen use but no longer on NSAID >1 year  Plan: 1. Continue to control BP, A1c - both improved 2. Continue to improve hydration 3. Avoid NSAIDs 4. FUTURE potential for referral to Nephrology - again deferred by their preference, will monitor labs and progress still, and if other changes will refer if inc Cr or uncontrol BP 5. Follow-up 6 months lab      CAD, multiple vessel    Stable, medically managed,  s/p RCA STEMI and PCI Followed [facility] On ASA, high intensity statin, ARB, BB With LDL goal < 70      Benign hypertension with CKD (chronic kidney disease) stage III (HCC)    Controlled HTN - Home BP readings normal Complication with CKD-III close to progression CKD-IV   Plan:  1. Continue current BP regimen - Metoprolol 25mg  BID, Losartan 25mg  daily - continue ARB with CKD for now - Caution with Hyperkalemia - advised modify diet to reduce K rich foods bananas for now now improved 2. Encourage improved lifestyle - low sodium diet, regular exercise 3. Continue monitor BP outside office, bring readings to next visit, if persistently >140/90 or  new symptoms notify office sooner 4. Follow-up 6 months labs       Other Visit Diagnoses    Annual physical exam    -  Primary   Needs flu shot       Relevant Orders   [facility] High Dose 2020+ Flu Vaccine QUAD High Dose(Fluad) (Completed)     Updated Health Maintenance information Reviewed recent lab results with patient Encouraged improvement to lifestyle with diet and exercise Goal of weight loss    No orders of the defined types were placed in this encounter.   Follow up plan: Return in about 6 months (around 03/24/2020) for 6 month follow-up Lab results, HTN CKD.   Future A1c, BMET, CBC 6 months  [name], DO [facility] 09/25/2019, 11:18 AM

## 2019-09-27 ENCOUNTER — Other Ambulatory Visit: Payer: Self-pay | Admitting: Family Medicine

## 2019-09-27 DIAGNOSIS — R7303 Prediabetes: Secondary | ICD-10-CM

## 2019-09-27 DIAGNOSIS — I129 Hypertensive chronic kidney disease with stage 1 through stage 4 chronic kidney disease, or unspecified chronic kidney disease: Secondary | ICD-10-CM

## 2019-09-27 DIAGNOSIS — N183 Chronic kidney disease, stage 3 unspecified: Secondary | ICD-10-CM

## 2019-09-27 DIAGNOSIS — N1831 Chronic kidney disease, stage 3a: Secondary | ICD-10-CM

## 2019-09-27 NOTE — Assessment & Plan Note (Signed)
Stable, without flare Followed by Dr Minerva Are[facility]m, yearly Albuterol PRN Remains smoke free Off symbicort and now spiriva is PRN

## 2019-09-27 NOTE — Assessment & Plan Note (Signed)
STable controlled in remission On Sertraline 50mg 

## 2019-09-27 NOTE — Assessment & Plan Note (Signed)
Controlled PreDM Concern with HTN, CKD-III  Plan:  1. Not on any therapy currently - remain off 2. Encourage improved lifestyle - low carb, low sugar diet, reduce portion size, continue improving regular exercise 3. Follow-up 6 month lab 

## 2019-09-27 NOTE — Assessment & Plan Note (Signed)
Controlled HTN - Home BP readings normal Complication with CKD-III close to progression CKD-IV   Plan:  1. Continue current BP regimen - Metoprolol 25mg  BID, Losartan 25mg  daily - continue ARB with CKD for now - Caution with Hyperkalemia - advised modify diet to reduce K rich foods bananas for now now improved 2. Encourage improved lifestyle - low sodium diet, regular exercise 3. Continue monitor BP outside office, bring readings to next visit, if persistently >140/90 or new symptoms notify office sooner 4. Follow-up 6 months labs

## 2019-09-27 NOTE — Assessment & Plan Note (Signed)
Stable, medically managed, s/p RCA STEMI and PCI Followed by [facility] On ASA, high intensity statin, ARB, BB With LDL goal < 70 

## 2019-09-27 NOTE — Assessment & Plan Note (Signed)
Lipids nearly at goal but now some elevated LDL >70 Elevated TG >200  Emphasis on diet management and continue current cholesterol therapy

## 2019-09-27 NOTE — Assessment & Plan Note (Signed)
Mild improved CKD with Cr 1.4 At risk progression to CKD-IV, in setting of HTN, PreDM, age 73 and former history of ibuprofen use but no longer on NSAID >1 year  Plan: 1. Continue to control BP, A1c - both improved 2. Continue to improve hydration 3. Avoid NSAIDs 4. FUTURE potential for referral to Nephrology - again deferred by their preference, will monitor labs and progress still, and if other changes will refer if inc Cr or uncontrol BP 5. Follow-up 6 months lab

## 2019-11-18 ENCOUNTER — Telehealth: Payer: Self-pay | Admitting: Family Medicine

## 2019-11-18 NOTE — Telephone Encounter (Signed)
I called the pt to schedule AWV with Tiffany.  His wife said that he works 6am-6pm and would prefer to have it when he comes back to see Dr. Raliegh Ip in May 2021.

## 2020-01-22 DIAGNOSIS — J449 Chronic obstructive pulmonary disease, unspecified: Secondary | ICD-10-CM | POA: Diagnosis not present

## 2020-01-22 DIAGNOSIS — R0602 Shortness of breath: Secondary | ICD-10-CM | POA: Diagnosis not present

## 2020-01-22 DIAGNOSIS — R0902 Hypoxemia: Secondary | ICD-10-CM | POA: Diagnosis not present

## 2020-02-22 DIAGNOSIS — J449 Chronic obstructive pulmonary disease, unspecified: Secondary | ICD-10-CM | POA: Diagnosis not present

## 2020-03-22 ENCOUNTER — Other Ambulatory Visit: Payer: Medicare PPO

## 2020-03-22 ENCOUNTER — Other Ambulatory Visit: Payer: Self-pay

## 2020-03-22 DIAGNOSIS — N1831 Chronic kidney disease, stage 3a: Secondary | ICD-10-CM | POA: Diagnosis not present

## 2020-03-22 DIAGNOSIS — R7303 Prediabetes: Secondary | ICD-10-CM

## 2020-03-22 DIAGNOSIS — I129 Hypertensive chronic kidney disease with stage 1 through stage 4 chronic kidney disease, or unspecified chronic kidney disease: Secondary | ICD-10-CM | POA: Diagnosis not present

## 2020-03-22 DIAGNOSIS — N183 Chronic kidney disease, stage 3 unspecified: Secondary | ICD-10-CM | POA: Diagnosis not present

## 2020-03-23 DIAGNOSIS — J449 Chronic obstructive pulmonary disease, unspecified: Secondary | ICD-10-CM | POA: Diagnosis not present

## 2020-03-23 LAB — CBC WITH DIFFERENTIAL/PLATELET
Absolute Monocytes: 761 cells/uL (ref 200–950)
Basophils Absolute: 73 cells/uL (ref 0–200)
Basophils Relative: 0.9 %
Eosinophils Absolute: 1369 cells/uL — ABNORMAL HIGH (ref 15–500)
Eosinophils Relative: 16.9 %
HCT: 39.6 % (ref 38.5–50.0)
Hemoglobin: 12.8 g/dL — ABNORMAL LOW (ref 13.2–17.1)
Lymphs Abs: 1571 cells/uL (ref 850–3900)
MCH: 30 pg (ref 27.0–33.0)
MCHC: 32.3 g/dL (ref 32.0–36.0)
MCV: 93 fL (ref 80.0–100.0)
MPV: 10.3 fL (ref 7.5–12.5)
Monocytes Relative: 9.4 %
Neutro Abs: 4325 cells/uL (ref 1500–7800)
Neutrophils Relative %: 53.4 %
Platelets: 159 10*3/uL (ref 140–400)
RBC: 4.26 10*6/uL (ref 4.20–5.80)
RDW: 12.6 % (ref 11.0–15.0)
Total Lymphocyte: 19.4 %
WBC: 8.1 10*3/uL (ref 3.8–10.8)

## 2020-03-23 LAB — HEMOGLOBIN A1C
Hgb A1c MFr Bld: 6.1 % of total Hgb — ABNORMAL HIGH (ref <5.7)
Mean Plasma Glucose: 128 (calc)
eAG (mmol/L): 7.1 (calc)

## 2020-03-23 LAB — BASIC METABOLIC PANEL WITH GFR
BUN/Creatinine Ratio: 15 (calc) (ref 6–22)
BUN: 24 mg/dL (ref 7–25)
CO2: 26 mmol/L (ref 20–32)
Calcium: 8.8 mg/dL (ref 8.6–10.3)
Chloride: 109 mmol/L (ref 98–110)
Creat: 1.6 mg/dL — ABNORMAL HIGH (ref 0.70–1.18)
GFR, Est African American: 49 mL/min/{1.73_m2} — ABNORMAL LOW (ref >=60)
GFR, Est Non African American: 43 mL/min/{1.73_m2} — ABNORMAL LOW (ref >=60)
Glucose, Bld: 97 mg/dL (ref 65–99)
Potassium: 4.7 mmol/L (ref 3.5–5.3)
Sodium: 142 mmol/L (ref 135–146)

## 2020-03-24 DIAGNOSIS — I129 Hypertensive chronic kidney disease with stage 1 through stage 4 chronic kidney disease, or unspecified chronic kidney disease: Secondary | ICD-10-CM | POA: Diagnosis not present

## 2020-03-24 DIAGNOSIS — N183 Chronic kidney disease, stage 3 unspecified: Secondary | ICD-10-CM | POA: Diagnosis not present

## 2020-03-24 DIAGNOSIS — I2111 ST elevation (STEMI) myocardial infarction involving right coronary artery: Secondary | ICD-10-CM | POA: Diagnosis not present

## 2020-03-24 DIAGNOSIS — I2510 Atherosclerotic heart disease of native coronary artery without angina pectoris: Secondary | ICD-10-CM | POA: Diagnosis not present

## 2020-03-24 DIAGNOSIS — J439 Emphysema, unspecified: Secondary | ICD-10-CM | POA: Diagnosis not present

## 2020-03-24 DIAGNOSIS — I252 Old myocardial infarction: Secondary | ICD-10-CM | POA: Diagnosis not present

## 2020-03-29 ENCOUNTER — Ambulatory Visit (INDEPENDENT_AMBULATORY_CARE_PROVIDER_SITE_OTHER): Payer: Medicare PPO

## 2020-03-29 ENCOUNTER — Ambulatory Visit (INDEPENDENT_AMBULATORY_CARE_PROVIDER_SITE_OTHER): Payer: Medicare PPO | Admitting: Family Medicine

## 2020-03-29 ENCOUNTER — Encounter: Payer: Self-pay | Admitting: Family Medicine

## 2020-03-29 ENCOUNTER — Other Ambulatory Visit: Payer: Self-pay | Admitting: Family Medicine

## 2020-03-29 ENCOUNTER — Other Ambulatory Visit: Payer: Self-pay

## 2020-03-29 VITALS — BP 125/68 | HR 56 | Temp 97.3°F | Ht 71.0 in | Wt 230.0 lb

## 2020-03-29 VITALS — BP 125/68 | HR 56 | Temp 97.3°F | Resp 16 | Ht 71.0 in | Wt 230.0 lb

## 2020-03-29 DIAGNOSIS — I129 Hypertensive chronic kidney disease with stage 1 through stage 4 chronic kidney disease, or unspecified chronic kidney disease: Secondary | ICD-10-CM

## 2020-03-29 DIAGNOSIS — Z0000 Encounter for general adult medical examination without abnormal findings: Secondary | ICD-10-CM | POA: Diagnosis not present

## 2020-03-29 DIAGNOSIS — R7303 Prediabetes: Secondary | ICD-10-CM | POA: Diagnosis not present

## 2020-03-29 DIAGNOSIS — Z1211 Encounter for screening for malignant neoplasm of colon: Secondary | ICD-10-CM | POA: Diagnosis not present

## 2020-03-29 DIAGNOSIS — N1831 Chronic kidney disease, stage 3a: Secondary | ICD-10-CM | POA: Diagnosis not present

## 2020-03-29 DIAGNOSIS — E785 Hyperlipidemia, unspecified: Secondary | ICD-10-CM

## 2020-03-29 DIAGNOSIS — N183 Chronic kidney disease, stage 3 unspecified: Secondary | ICD-10-CM

## 2020-03-29 DIAGNOSIS — F325 Major depressive disorder, single episode, in full remission: Secondary | ICD-10-CM

## 2020-03-29 DIAGNOSIS — J431 Panlobular emphysema: Secondary | ICD-10-CM | POA: Diagnosis not present

## 2020-03-29 DIAGNOSIS — R351 Nocturia: Secondary | ICD-10-CM

## 2020-03-29 NOTE — Progress Notes (Signed)
Subjective:   George Jensen is a 74 y.o. male who presents for Medicare Annual/Subsequent preventive examination.  Review of Systems:  Cardiac Risk Factors include: advanced age (>60men, >20 women);male gender;hypertension;dyslipidemia     Objective:    Vitals: BP 125/68   Pulse (!) 56   Temp (!) 97.3 F (36.3 C)   Ht 5\' 11"  (1.803 m)   Wt 230 lb (104.3 kg)   BMI 32.08 kg/m   Body mass index is 32.08 kg/m.  Advanced Directives 03/29/2020 10/07/2018 06/11/2017 09/16/2015  Does Patient Have a Medical Advance Directive? No No Yes No  Type of Advance Directive - Public librarian;Living will -  Copy of [place] in Chart? - - No - copy requested -  Would patient like information on creating a medical advance directive? No - Patient declined - - No - patient declined information    Tobacco Social History   Tobacco Use  Smoking Status Former Smoker  . Packs/day: 2.00  . Types: Cigarettes  . Quit date: 10/19/2014  . Years since quitting: 5.4  Smokeless Tobacco Former Engineer, structural given: Not Answered   Clinical Intake:  Pre-visit preparation completed: Yes  Pain : No/denies pain     Nutritional Status: BMI > 30  Obese Nutritional Risks: None Diabetes: No  How often do you need to have someone help you when you read instructions, pamphlets, or other written materials from your doctor or pharmacy?: 1 - Never  Interpreter Needed?: No  Information entered by :: Bridget McDonough,LPN  Past Medical History:  Diagnosis Date  . BCC (basal cell carcinoma)    x 2 on face  . COPD (chronic obstructive pulmonary disease) ([place])   . Hyperlipidemia   . Hypertension   . Melanoma ([place])   . Myocardial infarction ([place]) 10/2014  . SCCA (squamous cell carcinoma) of skin    SCC x 1 on face   Past Surgical History:  Procedure Laterality Date  . CAROTID STENT  10/2014   3 STENTS [facility]  . MELANOMA EXCISION  1982   Family History    Problem Relation Age of Onset  . Alzheimer's disease Mother   . Diabetes Father   . Heart disease Father    Social History   Socioeconomic History  . Marital status: Married    Spouse name: Merek Kasparian  . Number of children: 4  . Years of education: Not on file  . Highest education level: 9th grade  Occupational History  . Occupation: Heavy Radio producer  Tobacco Use  . Smoking status: Former Smoker    Packs/day: 2.00    Types: Cigarettes    Quit date: 10/19/2014    Years since quitting: 5.4  . Smokeless tobacco: Former Network engineer and Sexual Activity  . Alcohol use: No  . Drug use: No  . Sexual activity: Not on file  Other Topics Concern  . Not on file  Social History Narrative  . Not on file   Social Determinants of Health   Financial Resource Strain: Low Risk   . Difficulty of Paying Living Expenses: Not hard at all  Food Insecurity: No Food Insecurity  . Worried About Charity fundraiser in the Last Year: Never true  . Ran Out of Food in the Last Year: Never true  Transportation Needs: No Transportation Needs  . Lack of Transportation (Medical): No  . Lack of Transportation (Non-Medical): No  Physical Activity: Inactive  .  Days of Exercise per Week: 0 days  . Minutes of Exercise per Session: 0 min  Stress:   . Feeling of Stress :   Social Connections: Somewhat Isolated  . Frequency of Communication with Friends and Family: More than three times a week  . Frequency of Social Gatherings with Friends and Family: More than three times a week  . Attends Religious Services: Never  . Active Member of Clubs or Organizations: No  . Attends Archivist Meetings: Never  . Marital Status: Married    Outpatient Encounter Medications as of 03/29/2020  Medication Sig  . albuterol (PROVENTIL HFA;VENTOLIN HFA) 108 (90 BASE) MCG/ACT inhaler Inhale 2 puffs into the lungs every 4 (four) hours as needed for wheezing or shortness of breath (or shortness of  breath).  Marland Kitchen aspirin 81 MG EC tablet TAKE 1 TABLET EVERY DAY  . atorvastatin (LIPITOR) 80 MG tablet Take 80 mg by mouth daily at 6 PM.   . ezetimibe (ZETIA) 10 MG tablet Take 10 mg by mouth daily.   Marland Kitchen losartan (COZAAR) 25 MG tablet TAKE 1 TABLET EVERY DAY  . metoprolol tartrate (LOPRESSOR) 25 MG tablet Take 25 mg by mouth 2 (two) times daily.   . Multiple Vitamins-Minerals (MULTIVITAMIN MEN 50+) TABS Take daily by mouth.  . nitroGLYCERIN (NITROSTAT) 0.4 MG SL tablet Place 0.4 mg under the tongue every 5 (five) minutes as needed for chest pain. AS NEEDED  . omeprazole (PRILOSEC) 40 MG capsule TAKE 1 CAPSULE EVERY DAY  . sertraline (ZOLOFT) 50 MG tablet TAKE 1 TABLET EVERY DAY  . tiotropium (SPIRIVA) 18 MCG inhalation capsule Place 18 mcg into inhaler and inhale every other day.    No facility-administered encounter medications on file as of 03/29/2020.    Activities of Daily Living In your present state of health, do you have any difficulty performing the following activities: 03/29/2020 03/29/2020  Hearing? N N  Comment no hearing aids -  Vision? N N  Comment eyeglasses, Dr.Woodard -  Difficulty concentrating or making decisions? N N  Walking or climbing stairs? N N  Dressing or bathing? N N  Doing errands, shopping? N N  Preparing Food and eating ? N -  Using the Toilet? N -  In the past six months, have you accidently leaked urine? N -  Do you have problems with loss of bowel control? N -  Managing your Medications? N -  Managing your Finances? N -  Housekeeping or managing your Housekeeping? N -  Some recent data might be hidden    Patient Care Team: Olin Hauser, DO as PCP - General (Family Medicine) Erby Pian, MD as Referring Physician (Specialist) Anell Barr, [place] (Optometry) Jannet Mantis, MD (Dermatology) Isaias Cowman, MD as Consulting Physician (Cardiology)   Assessment:   This is a routine wellness examination for  George Jensen.  Exercise Activities and Dietary recommendations Current Exercise Habits: The patient has a physically strenuous job, but has no regular exercise apart from work., Exercise limited by: None identified  Goals Addressed   None     Fall Risk: Fall Risk  03/29/2020 09/25/2019 04/09/2019 10/07/2018 06/11/2017  Falls in the past year? 0 0 0 0 No  Number falls in past yr: 0 0 - - -  Injury with Fall? 0 - - - -  Follow up Falls evaluation completed Falls evaluation completed Falls evaluation completed - -    FALL RISK PREVENTION PERTAINING TO THE HOME:  Any stairs in or  around the home? Yes  steps going in home  If so, are there any without handrails? No   Home free of loose throw rugs in walkways, pet beds, electrical cords, etc? Yes  Adequate lighting in your home to reduce risk of falls? Yes   ASSISTIVE DEVICES UTILIZED TO PREVENT FALLS:  Life alert? No  Use of a cane, walker or w/c? No  Grab bars in the bathroom? No  Shower chair or bench in shower? No  Elevated toilet seat or a handicapped toilet? No   TIMED UP AND GO:  Was the test performed? Yes .  Length of time to ambulate 10 feet: 8 sec.   GAIT:  Appearance of gait: Gait steady and fast without the use of an assistive device. Education: Fall risk prevention has been discussed.  Intervention(s) required? Yes  DME/home health order needed?  Yes   Depression Screen PHQ 2/9 Scores 03/29/2020 09/25/2019 04/09/2019 10/07/2018  PHQ - 2 Score 0 0 0 0  PHQ- 9 Score 0 3 - -    Cognitive Function     6CIT Screen 10/07/2018 06/11/2017  What Year? 0 points 0 points  What month? 0 points 0 points  What time? 0 points 0 points  Count back from 20 0 points 0 points  Months in reverse 0 points 0 points  Repeat phrase 2 points 0 points  Total Score 2 0    Immunization History  Administered Date(s) Administered  . Fluad Quad(high Dose 65+) 09/25/2019  . Influenza, High Dose Seasonal PF 08/12/2015, 09/06/2016,  09/20/2017, 10/07/2018  . Moderna SARS-COVID-2 Vaccination 03/24/2020  . Pneumococcal Conjugate-13 08/12/2015, 09/06/2016  . Pneumococcal Polysaccharide-23 09/27/2017    Qualifies for Shingles Vaccine? No, did not have chicken pox   Tdap: up to date   Flu Vaccine: up to date   Pneumococcal Vaccine: completed   Covid-19 Vaccine: Completed first dose   Screening Tests Health Maintenance  Topic Date Due  . Fecal DNA (Cologuard)  Never done  . COVID-19 Vaccine (2 - Moderna 2-dose series) 04/21/2020  . INFLUENZA VACCINE  06/19/2020  . TETANUS/TDAP  09/15/2025  . Hepatitis C Screening  Completed  . PNA vac Low Risk Adult  Completed   Cancer Screenings:  Colorectal Screening: cologuard ordered   Lung Cancer Screening: (Low Dose CT Chest recommended if Age 26-80 years, 30 pack-year currently smoking OR have quit w/in 15years.) does qualify.   Declined     Additional Screening:  Hepatitis C Screening: does qualify; Completed 2020  Vision Screening: Recommended annual ophthalmology exams for early detection of glaucoma and other disorders of the eye. Is the patient up to date with their annual eye exam?  Yes  Who is the provider or what is the name of the office in which the pt attends annual eye exams? Dr.Woodard   Dental Screening: Recommended annual dental exams for proper oral hygiene  Community Resource Referral:  CRR required this visit?  No        Plan:  I have personally reviewed and addressed the Medicare Annual Wellness questionnaire and have noted the following in the patient's chart:  A. Medical and social history B. Use of alcohol, tobacco or illicit drugs  C. Current medications and supplements D. Functional ability and status E.  Nutritional status F.  Physical activity G. Advance directives H. List of other physicians I.  Hospitalizations, surgeries, and ER visits in previous 12 months J.  [place] such as hearing and vision if needed,  cognitive and depression L. Referrals and appointments   In addition, I have reviewed and discussed with patient certain preventive protocols, quality metrics, and best practice recommendations. A written personalized care plan for preventive services as well as general preventive health recommendations were provided to patient.   Signed,   Bevelyn Ngo, LPN  [id number] Nurse Health Advisor   Nurse Notes: none

## 2020-03-29 NOTE — Patient Instructions (Addendum)
Thank you for coming to the office today.  Keep up with 2nd dose COVID19 vaccine.  Keep up the great work.  Recent Labs    04/08/19 0824 09/18/19 0752 03/22/20 0749  HGBA1C 6.3* 6.3* 6.1*    Colon Cancer Screening: - For all adults age 72+ routine colon cancer screening is highly recommended.     - Recent guidelines from [place] recommend starting age of 72 - Early detection of colon cancer is important, because often there are no warning signs or symptoms, also if found early usually it can be cured. Late stage is hard to treat.  - If you are not interested in Colonoscopy screening (if done and normal you could be cleared for 5 to 10 years until next due), then Cologuard is an excellent alternative for screening test for Colon Cancer. It is highly sensitive for detecting DNA of colon cancer from even the earliest stages. Also, there is NO bowel prep required. - If Cologuard is NEGATIVE, then it is good for 3 years before next due - If Cologuard is POSITIVE, then it is strongly advised to get a Colonoscopy, which allows the GI doctor to locate the source of the cancer or polyp (even very early stage) and treat it by removing it. -------------------------  ORDERED COLOGUARD The test kit will be delivered to you house within about 1 week. Follow instructions to collect sample, you may call the company for any help or questions, 24/7 telephone support at [phone].    DUE for FASTING BLOOD WORK (no food or drink after midnight before the lab appointment, only water or coffee without cream/sugar on the morning of)  SCHEDULE "Lab Only" visit in the morning at the clinic for lab draw in 6 MONTHS   - Make sure Lab Only appointment is at about 1 week before your next appointment, so that results will be available  For Lab Results, once available within 2-3 days of blood draw, you can can log in to MyChart online to view your results and a brief explanation. Also, we can  discuss results at next follow-up visit.   Please schedule a Follow-up Appointment to: Return in about 6 months (around 09/29/2020) for Annual Physical.  If you have any other questions or concerns, please feel free to call the office or send a message through [place]. You may also schedule an earlier appointment if necessary.  Additionally, you may be receiving a survey about your experience at our office within a few days to 1 week by e-mail or mail. We value your feedback.  [name], DO [place]

## 2020-03-29 NOTE — Patient Instructions (Signed)
George Jensen , Thank you for taking time to come for your Medicare Wellness Visit. I appreciate your ongoing commitment to your health goals. Please review the following plan we discussed and let me know if I can assist you in the future.   Screening recommendations/referrals: Colonoscopy: cologuard ordered  Recommended yearly ophthalmology/optometry visit for glaucoma screening and checkup Recommended yearly dental visit for hygiene and checkup  Vaccinations: Influenza vaccine: up to date  Pneumococcal vaccine: up to date  Tdap vaccine: up to date  Shingles vaccine: not indicated    Covid-19: completed first dose   Advanced directives: Advance directive discussed with you today. Even though you declined this today please call our office should you change your mind and we can give you the proper paperwork for you to fill out. Will discuss more at next visit   Conditions/risks identified: none   Next appointment: Follow up in one year for your annual wellness visit.   Preventive Care 72 Years and Older, Male Preventive care refers to lifestyle choices and visits with your health care provider that can promote health and wellness. What does preventive care include?  A yearly physical exam. This is also called an annual well check.  Dental exams once or twice a year.  Routine eye exams. Ask your health care provider how often you should have your eyes checked.  Personal lifestyle choices, including:  Daily care of your teeth and gums.  Regular physical activity.  Eating a healthy diet.  Avoiding tobacco and drug use.  Limiting alcohol use.  Practicing safe sex.  Taking low doses of aspirin every day.  Taking vitamin and mineral supplements as recommended by your health care provider. What happens during an annual well check? The services and screenings done by your health care provider during your annual well check will depend on your age, overall health, lifestyle risk  factors, and family history of disease. Counseling  Your health care provider may ask you questions about your:  Alcohol use.  Tobacco use.  Drug use.  Emotional well-being.  Home and relationship well-being.  Sexual activity.  Eating habits.  History of falls.  Memory and ability to understand (cognition).  Work and work Statistician. Screening  You may have the following tests or measurements:  Height, weight, and BMI.  Blood pressure.  Lipid and cholesterol levels. These may be checked every 5 years, or more frequently if you are over 26 years old.  Skin check.  Lung cancer screening. You may have this screening every year starting at age 12 if you have a 30-pack-year history of smoking and currently smoke or have quit within the past 15 years.  Fecal occult blood test (FOBT) of the stool. You may have this test every year starting at age 57.  Flexible sigmoidoscopy or colonoscopy. You may have a sigmoidoscopy every 5 years or a colonoscopy every 10 years starting at age 39.  Prostate cancer screening. Recommendations will vary depending on your family history and other risks.  Hepatitis C blood test.  Hepatitis B blood test.  Sexually transmitted disease (STD) testing.  Diabetes screening. This is done by checking your blood sugar (glucose) after you have not eaten for a while (fasting). You may have this done every 1-3 years.  Abdominal aortic aneurysm (AAA) screening. You may need this if you are a current or former smoker.  Osteoporosis. You may be screened starting at age 28 if you are at high risk. Talk with your health care provider about  your test results, treatment options, and if necessary, the need for more tests. Vaccines  Your health care provider may recommend certain vaccines, such as:  Influenza vaccine. This is recommended every year.  Tetanus, diphtheria, and acellular pertussis (Tdap, Td) vaccine. You may need a Td booster every 10  years.  Zoster vaccine. You may need this after age 46.  Pneumococcal 13-valent conjugate (PCV13) vaccine. One dose is recommended after age 79.  Pneumococcal polysaccharide (PPSV23) vaccine. One dose is recommended after age 52. Talk to your health care provider about which screenings and vaccines you need and how often you need them. This information is not intended to replace advice given to you by your health care provider. Make sure you discuss any questions you have with your health care provider. Document Released: 12/02/2015 Document Revised: 07/25/2016 Document Reviewed: 09/06/2015 Elsevier Interactive Patient Education  2017 [place] Prevention in the Home Falls can cause injuries. They can happen to people of all ages. There are many things you can do to make your home safe and to help prevent falls. What can I do on the outside of my home?  Regularly fix the edges of walkways and driveways and fix any cracks.  Remove anything that might make you trip as you walk through a door, such as a raised step or threshold.  Trim any bushes or trees on the path to your home.  Use bright outdoor lighting.  Clear any walking paths of anything that might make someone trip, such as rocks or tools.  Regularly check to see if handrails are loose or broken. Make sure that both sides of any steps have handrails.  Any raised decks and porches should have guardrails on the edges.  Have any leaves, snow, or ice cleared regularly.  Use sand or salt on walking paths during winter.  Clean up any spills in your garage right away. This includes oil or grease spills. What can I do in the bathroom?  Use night lights.  Install grab bars by the toilet and in the tub and shower. Do not use towel bars as grab bars.  Use non-skid mats or decals in the tub or shower.  If you need to sit down in the shower, use a plastic, non-slip stool.  Keep the floor dry. Clean up any water that  spills on the floor as soon as it happens.  Remove soap buildup in the tub or shower regularly.  Attach bath mats securely with double-sided non-slip rug tape.  Do not have throw rugs and other things on the floor that can make you trip. What can I do in the bedroom?  Use night lights.  Make sure that you have a light by your bed that is easy to reach.  Do not use any sheets or blankets that are too big for your bed. They should not hang down onto the floor.  Have a firm chair that has side arms. You can use this for support while you get dressed.  Do not have throw rugs and other things on the floor that can make you trip. What can I do in the kitchen?  Clean up any spills right away.  Avoid walking on wet floors.  Keep items that you use a lot in easy-to-reach places.  If you need to reach something above you, use a strong step stool that has a grab bar.  Keep electrical cords out of the way.  Do not use floor polish or wax  that makes floors slippery. If you must use wax, use non-skid floor wax.  Do not have throw rugs and other things on the floor that can make you trip. What can I do with my stairs?  Do not leave any items on the stairs.  Make sure that there are handrails on both sides of the stairs and use them. Fix handrails that are broken or loose. Make sure that handrails are as long as the stairways.  Check any carpeting to make sure that it is firmly attached to the stairs. Fix any carpet that is loose or worn.  Avoid having throw rugs at the top or bottom of the stairs. If you do have throw rugs, attach them to the floor with carpet tape.  Make sure that you have a light switch at the top of the stairs and the bottom of the stairs. If you do not have them, ask someone to add them for you. What else can I do to help prevent falls?  Wear shoes that:  Do not have high heels.  Have rubber bottoms.  Are comfortable and fit you well.  Are closed at the  toe. Do not wear sandals.  If you use a stepladder:  Make sure that it is fully opened. Do not climb a closed stepladder.  Make sure that both sides of the stepladder are locked into place.  Ask someone to hold it for you, if possible.  Clearly mark and make sure that you can see:  Any grab bars or handrails.  First and last steps.  Where the edge of each step is.  Use tools that help you move around (mobility aids) if they are needed. These include:  Canes.  Walkers.  Scooters.  Crutches.  Turn on the lights when you go into a dark area. Replace any light bulbs as soon as they burn out.  Set up your furniture so you have a clear path. Avoid moving your furniture around.  If any of your floors are uneven, fix them.  If there are any pets around you, be aware of where they are.  Review your medicines with your doctor. Some medicines can make you feel dizzy. This can increase your chance of falling. Ask your doctor what other things that you can do to help prevent falls. This information is not intended to replace advice given to you by your health care provider. Make sure you discuss any questions you have with your health care provider. Document Released: 09/01/2009 Document Revised: 04/12/2016 Document Reviewed: 12/10/2014 Elsevier Interactive Patient Education  2017 [employer].

## 2020-03-29 NOTE — Progress Notes (Signed)
Subjective:    Patient ID: George Jensen, male    DOB: 1946/03/18, 74 y.o.   MRN: [id number]  LUDDIE HEBELER is a 74 y.o. male presenting on 03/29/2020 for Hypertension  Accompanied by wife, Blanch Media.  Will also see Tyler Aas LPN today for AMW.  HPI   Pre-Diabetes: Last lab result 6.1. Improvement. Previously 6.3. CBGs: Does not check CBG Meds: No medication(never on med) Currently on ARB Lifestyle: -Diet (Limited sodas and candy now, diet has improved balanced) -Exercise (workingoutdoors, when weather nice, but not always exercising with walking) - Taking ASA 81mg , Statin Denies hypoglycemia, polyuria, visual changes, numbness or tingling.  CHRONIC HTN/ CKD-III Hyperkalemia resolved Known chronic CKD-III - improved Cr and GFR on last lab. Resolved HyperK Reports no concerns.Home BP readings arenormal range Current Meds - Metoprolol 25mg  BID, Losartan 25mg  Reports good compliance, took meds today. Tolerating well, w/o complaints. - Tries to stay well hydrated with water. Admits had eaten many bananas lately Denies CP, dyspnea, HA, edema, dizziness / lightheadedness  Mild Anemia, Normocytic CBC lab showsHgb improved to 12.8 He denies symptoms  COPD, Former Smoker Recently been back to Dr Raul [facility] Pulm 01/2020, he was restarted on Spiriva 46mcg once daily doing well - Rarely using Albuterol PRN  Major Depression in Remission No new concerns, see PHQ On Sertraline  Health Maintenance: UTD COVID19 vaccine Moderna 1st dose 03/24/20, next dose 04/21/20  Depression screen [facility] 2/9 03/29/2020 09/25/2019 04/09/2019  Decreased Interest 0 0 0  Down, Depressed, Hopeless 0 0 0  PHQ - 2 Score 0 0 0  Altered sleeping 0 1 -  Tired, decreased energy 0 1 -  Change in appetite 0 0 -  Feeling bad or failure about yourself  0 0 -  Trouble concentrating 0 1 -  Moving slowly or fidgety/restless 0 0 -  Suicidal thoughts 0 0 -  PHQ-9 Score 0 3 -  Difficult doing  work/chores Not difficult at all Somewhat difficult -    Social History   Tobacco Use  . Smoking status: Former Smoker    Packs/day: 2.00    Types: Cigarettes    Quit date: 10/19/2014    Years since quitting: 5.4  . Smokeless tobacco: Former Network engineer Use Topics  . Alcohol use: No  . Drug use: No    Review of Systems Per HPI unless specifically indicated above     Objective:    BP 125/68   Pulse (!) 56   Temp (!) 97.3 F (36.3 C) (Temporal)   Resp 16   Ht 5\' 11"  (1.803 m)   Wt 230 lb (104.3 kg)   SpO2 97%   BMI 32.08 kg/m   Wt Readings from Last 3 Encounters:  03/29/20 230 lb (104.3 kg)  03/29/20 230 lb (104.3 kg)  09/25/19 233 lb (105.7 kg)    Physical Exam Vitals and nursing note reviewed.  Constitutional:      General: He is not in acute distress.    Appearance: He is well-developed. He is not diaphoretic.     Comments: Well-appearing, comfortable, cooperative  HENT:     Head: Normocephalic and atraumatic.  Eyes:     General:        Right eye: No discharge.        Left eye: No discharge.     Conjunctiva/sclera: Conjunctivae normal.  Neck:     Thyroid: No thyromegaly.  Cardiovascular:     Rate and Rhythm: Normal rate and regular rhythm.  Heart sounds: Normal heart sounds. No murmur.  Pulmonary:     Effort: Pulmonary effort is normal. No respiratory distress.     Breath sounds: Normal breath sounds. No wheezing or rales.  Musculoskeletal:        General: Normal range of motion.     Cervical back: Normal range of motion and neck supple.  Lymphadenopathy:     Cervical: No cervical adenopathy.  Skin:    General: Skin is warm and dry.     Findings: No erythema or rash.  Neurological:     Mental Status: He is alert and oriented to person, place, and time.  Psychiatric:        Behavior: Behavior normal.     Comments: Well groomed, good eye contact, normal speech and thoughts      Recent Labs    04/08/19 0824 09/18/19 0752 03/22/20 0749   HGBA1C 6.3* 6.3* 6.1*    Results for orders placed or performed in visit on 03/22/20  [facility] - BMET w/ GFR BMP Basic Metabolic Panel  Result Value Ref Range   Glucose, Bld 97 65 - 99 mg/dL   BUN 24 7 - 25 mg/dL   Creat 1.60 (H) 0.70 - 1.18 mg/dL   GFR, Est Non African American 43 (L) > OR = 60 mL/min/1.80m2   GFR, Est African American 49 (L) > OR = 60 mL/min/1.43m2   BUN/Creatinine Ratio 15 6 - 22 (calc)   Sodium 142 135 - 146 mmol/L   Potassium 4.7 3.5 - 5.3 mmol/L   Chloride 109 98 - 110 mmol/L   CO2 26 20 - 32 mmol/L   Calcium 8.8 8.6 - 10.3 mg/dL  [facility] - CBC with Differential/Platelet physical  Result Value Ref Range   WBC 8.1 3.8 - 10.8 Thousand/uL   RBC 4.26 4.20 - 5.80 Million/uL   Hemoglobin 12.8 (L) 13.2 - 17.1 g/dL   HCT 39.6 38.5 - 50.0 %   MCV 93.0 80.0 - 100.0 fL   MCH 30.0 27.0 - 33.0 pg   MCHC 32.3 32.0 - 36.0 g/dL   RDW 12.6 11.0 - 15.0 %   Platelets 159 140 - 400 Thousand/uL   MPV 10.3 7.5 - 12.5 fL   Neutro Abs 4,325 1,500 - 7,800 cells/uL   Lymphs Abs 1,571 850 - 3,900 cells/uL   Absolute Monocytes 761 200 - 950 cells/uL   Eosinophils Absolute 1,369 (H) 15 - 500 cells/uL   Basophils Absolute 73 0 - 200 cells/uL   Neutrophils Relative % 53.4 %   Total Lymphocyte 19.4 %   Monocytes Relative 9.4 %   Eosinophils Relative 16.9 %   Basophils Relative 0.9 %  [facility] - A1c LAB Hemoglobin A1C physical  Result Value Ref Range   Hgb A1c MFr Bld 6.1 (H) <5.7 % of total Hgb   Mean Plasma Glucose 128 (calc)   eAG (mmol/L) 7.1 (calc)      Assessment & Plan:   Problem List Items Addressed This Visit    Pulmonary emphysema (HCC)    Stable, without flare Followed by Dr Minerva Areola Pulm Improved on SPiriva per Pulm Albuterol PRN Remains smoke free      Pre-diabetes - Primary    Controlled PreDM Concern with HTN, CKD-III  Plan:  1. Not on any therapy currently - remain off 2. Encourage improved lifestyle - low carb, low sugar diet, reduce portion size,  continue improving regular exercise 3. Follow-up 6 month lab      Major depression in  remission ([place])    Stable controlled in remission On Sertraline 50mg       CKD (chronic kidney disease), stage III    Stable CKD III , in setting of HTN, PreDM  Plan: 1. Continue to control BP, A1c - both improved 2. Continue to improve hydration 3. Avoid NSAIDs 4. FUTURE potential for referral to Nephrology 5. Follow-up 6 months lab      Benign hypertension with CKD (chronic kidney disease) stage III (HCC)    Controlled HTN - Home BP readings normal Complication with CKD-III   Plan:  1. Continue current BP regimen - Metoprolol 25mg  BID, Losartan 25mg  daily - continue ARB with CKD for now Improved K 2. Encourage improved lifestyle - low sodium diet, regular exercise 3. Continue monitor BP outside office, bring readings to next visit, if persistently >140/90 or new symptoms notify office sooner 4. Follow-up 6 months labs       Other Visit Diagnoses    Screening for colon cancer       Relevant Orders   Cologuard      #Anemia Stable, improved CBC Hgb  No orders of the defined types were placed in this encounter.   Follow up plan: Return in about 6 months (around 09/29/2020) for Annual Physical.  Future labs ordered for 09/22/20   [name], [facility] 03/29/2020, 8:42 AM

## 2020-03-30 NOTE — Assessment & Plan Note (Signed)
Controlled HTN - Home BP readings normal Complication with CKD-III   Plan:  1. Continue current BP regimen - Metoprolol 25mg  BID, Losartan 25mg  daily - continue ARB with CKD for now Improved K 2. Encourage improved lifestyle - low sodium diet, regular exercise 3. Continue monitor BP outside office, bring readings to next visit, if persistently >140/90 or new symptoms notify office sooner 4. Follow-up 6 months labs

## 2020-03-30 NOTE — Assessment & Plan Note (Signed)
Stable CKD III , in setting of HTN, PreDM  Plan: 1. Continue to control BP, A1c - both improved 2. Continue to improve hydration 3. Avoid NSAIDs 4. FUTURE potential for referral to Nephrology 5. Follow-up 6 months lab

## 2020-03-30 NOTE — Assessment & Plan Note (Signed)
Stable, without flare Followed by Dr Minerva Areola Pulm Improved on SPiriva per Pulm Albuterol PRN Remains smoke free

## 2020-03-30 NOTE — Assessment & Plan Note (Signed)
Stable controlled in remission On Sertraline 50mg 

## 2020-03-30 NOTE — Assessment & Plan Note (Signed)
Controlled PreDM Concern with HTN, CKD-III  Plan:  1. Not on any therapy currently - remain off 2. Encourage improved lifestyle - low carb, low sugar diet, reduce portion size, continue improving regular exercise 3. Follow-up 6 month lab

## 2020-04-23 DIAGNOSIS — J449 Chronic obstructive pulmonary disease, unspecified: Secondary | ICD-10-CM | POA: Diagnosis not present

## 2020-05-23 DIAGNOSIS — J449 Chronic obstructive pulmonary disease, unspecified: Secondary | ICD-10-CM | POA: Diagnosis not present

## 2020-06-23 DIAGNOSIS — J449 Chronic obstructive pulmonary disease, unspecified: Secondary | ICD-10-CM | POA: Diagnosis not present

## 2020-06-24 DIAGNOSIS — J432 Centrilobular emphysema: Secondary | ICD-10-CM | POA: Diagnosis not present

## 2020-08-05 DIAGNOSIS — I2111 ST elevation (STEMI) myocardial infarction involving right coronary artery: Secondary | ICD-10-CM | POA: Diagnosis not present

## 2020-08-05 DIAGNOSIS — Z23 Encounter for immunization: Secondary | ICD-10-CM | POA: Diagnosis not present

## 2020-08-05 DIAGNOSIS — I2510 Atherosclerotic heart disease of native coronary artery without angina pectoris: Secondary | ICD-10-CM | POA: Diagnosis not present

## 2020-08-05 DIAGNOSIS — N183 Chronic kidney disease, stage 3 unspecified: Secondary | ICD-10-CM | POA: Diagnosis not present

## 2020-08-05 DIAGNOSIS — J432 Centrilobular emphysema: Secondary | ICD-10-CM | POA: Diagnosis not present

## 2020-08-05 DIAGNOSIS — I129 Hypertensive chronic kidney disease with stage 1 through stage 4 chronic kidney disease, or unspecified chronic kidney disease: Secondary | ICD-10-CM | POA: Diagnosis not present

## 2020-08-05 DIAGNOSIS — E785 Hyperlipidemia, unspecified: Secondary | ICD-10-CM | POA: Diagnosis not present

## 2020-08-15 ENCOUNTER — Other Ambulatory Visit: Payer: Self-pay

## 2020-08-15 ENCOUNTER — Ambulatory Visit: Payer: Medicare PPO | Admitting: Family Medicine

## 2020-08-15 ENCOUNTER — Encounter: Payer: Self-pay | Admitting: Family Medicine

## 2020-08-15 VITALS — BP 99/60 | HR 67 | Temp 97.7°F | Resp 16 | Ht 71.0 in | Wt 225.6 lb

## 2020-08-15 DIAGNOSIS — R8290 Unspecified abnormal findings in urine: Secondary | ICD-10-CM

## 2020-08-15 DIAGNOSIS — K644 Residual hemorrhoidal skin tags: Secondary | ICD-10-CM

## 2020-08-15 DIAGNOSIS — K5904 Chronic idiopathic constipation: Secondary | ICD-10-CM

## 2020-08-15 DIAGNOSIS — R350 Frequency of micturition: Secondary | ICD-10-CM

## 2020-08-15 LAB — POCT URINALYSIS DIPSTICK
Bilirubin, UA: NEGATIVE
Glucose, UA: NEGATIVE
Ketones, UA: NEGATIVE
Nitrite, UA: NEGATIVE
Protein, UA: POSITIVE — AB
Spec Grav, UA: 1.02 (ref 1.010–1.025)
Urobilinogen, UA: 0.2 E.U./dL
pH, UA: 5 (ref 5.0–8.0)

## 2020-08-15 NOTE — Patient Instructions (Addendum)
Thank you for coming to the office today.  For Constipation (less frequent bowel movement that can be hard dry or involve straining).  Recommend trying OTC Miralax 17g = 1 capful in large glass water once daily for now, try several days to see if working, goal is soft stool or BM 1-2 times daily, if too loose then reduce dose or try every other day. If not effective may need to increase it to 2 doses at once in AM or may do 1 in morning and 1 in afternoon/evening  - This medicine is very safe and can be used often without any problem and will not make you dehydrated. It is good for use on AS NEEDED BASIS or even MAINTENANCE therapy for longer term for several days to weeks at a time to help regulate bowel movements  Other more natural remedies or preventative treatment: - Increase hydration with water - Increase fiber in diet (high fiber foods = vegetables, leafy greens, oats/grains) - May take OTC Fiber supplement (metamucil powder or pill/gummy) - May try OTC Probiotic  May add Prune juice if you prefer.  Flu shot next visit.  Urine test today, if it shows a problem, we can send it out for a Culture - and if that shows an infection we can call you and treat it. Otherwise, would hold off on antibiotics.   Please schedule a Follow-up Appointment to: Return if symptoms worsen or fail to improve, for keep apt in November 2021.  If you have any other questions or concerns, please feel free to call the office or send a message through [place]. You may also schedule an earlier appointment if necessary.  Additionally, you may be receiving a survey about your experience at our office within a few days to 1 week by e-mail or mail. We value your feedback.  [name], DO [place]

## 2020-08-15 NOTE — Progress Notes (Signed)
Subjective:    Patient ID: George Jensen, male    DOB: December 27, 1945, 74 y.o.   MRN: [id number]  George Jensen is a 74 y.o. male presenting on 08/15/2020 for Constipation (onset 2 weeks) and Urinary Tract Infection (strain to [place] as per patient onset 3 days)  Wife, George Jensen here as well.  HPI   Constipation External Hemorrhoids  Persistent issue for past 2 weeks, he tried Ex-Lax over weekend, and was given enema and suppository, also given Prune Juice as well. Difficulty with regular BMs, he does have hemorrhoids, he has had issues with straining and that worsens his hemorrhoids. He uses preparation H with good results.  He had large BM today with good results, and felt much better.  Additionally - admits Urinary Frequency and hesitancy over past few weeks, with this same issue worse once his constipation worsened, then has resolved now, normal urination.  Admits ext hemorrhoids with rectal pain episodic Denies any dark stool or blood in stool, abdominal pain, diarrhea, nausea vomiting, blood in urine.   Health Maintenance: Return to Flu Shot next visit 09/2020  Depression screen [name] 2/9 03/29/2020 09/25/2019 04/09/2019  Decreased Interest 0 0 0  Down, Depressed, Hopeless 0 0 0  PHQ - 2 Score 0 0 0  Altered sleeping 0 1 -  Tired, decreased energy 0 1 -  Change in appetite 0 0 -  Feeling bad or failure about yourself  0 0 -  Trouble concentrating 0 1 -  Moving slowly or fidgety/restless 0 0 -  Suicidal thoughts 0 0 -  PHQ-9 Score 0 3 -  Difficult doing work/chores Not difficult at all Somewhat difficult -    Social History   Tobacco Use  . Smoking status: Former Smoker    Packs/day: 2.00    Types: Cigarettes    Quit date: 10/19/2014    Years since quitting: 5.8  . Smokeless tobacco: Former Network engineer  . Vaping Use: Never used  Substance Use Topics  . Alcohol use: No  . Drug use: No    Review of Systems Per HPI unless specifically indicated  above     Objective:    BP 99/60   Pulse 67   Temp 97.7 F (36.5 C) (Temporal)   Resp 16   Ht 5\' 11"  (1.803 m)   Wt 225 lb 9.6 oz (102.3 kg)   SpO2 97%   BMI 31.46 kg/m   Wt Readings from Last 3 Encounters:  08/15/20 225 lb 9.6 oz (102.3 kg)  03/29/20 230 lb (104.3 kg)  03/29/20 230 lb (104.3 kg)    Physical Exam Vitals and nursing note reviewed.  Constitutional:      General: He is not in acute distress.    Appearance: He is well-developed. He is not diaphoretic.     Comments: Well-appearing, comfortable, cooperative  HENT:     Head: Normocephalic and atraumatic.  Eyes:     General:        Right eye: No discharge.        Left eye: No discharge.     Conjunctiva/sclera: Conjunctivae normal.  Cardiovascular:     Rate and Rhythm: Normal rate.  Pulmonary:     Effort: Pulmonary effort is normal.  Skin:    General: Skin is warm and dry.     Findings: No erythema or rash.  Neurological:     Mental Status: He is alert and oriented to person, place, and time.  Psychiatric:  Behavior: Behavior normal.     Comments: Well groomed, good eye contact, normal speech and thoughts       Results for orders placed or performed in visit on 08/15/20  POCT Urinalysis Dipstick  Result Value Ref Range   Color, UA dark yellow    Clarity, UA clody    Glucose, UA Negative Negative   Bilirubin, UA negative    Ketones, UA negative    Spec Grav, UA 1.020 1.010 - 1.025   Blood, UA large    pH, UA 5.0 5.0 - 8.0   Protein, UA Positive (A) Negative   Urobilinogen, UA 0.2 0.2 or 1.0 E.U./dL   Nitrite, UA negative    Leukocytes, UA 4+ (A) Negative   Appearance     Odor        Assessment & Plan:   Problem List Items Addressed This Visit    None    Visit Diagnoses    Chronic idiopathic constipation    -  Primary   Urinary frequency       Relevant Orders   POCT Urinalysis Dipstick (Completed)   Urine Culture   External hemorrhoids       Abnormal urinalysis        Relevant Orders   Urine Culture      #Constipation - acute on chronic, resolved. Last BM today Likely straining and constipation worsening his ext hemorrhoids Recommend Metamucil fiber supplement gummy daily START Miralax half cap to 1 cap daily or can do QOD dosing regimen, inc dose as needed for acute flare up constipation, can limit other laxative meds he has tried without good results  #Hemorrhoids, external Use preparation H as needed Treat underlying constipation  #Urinary frequency / abnormal UA dipstick Recent urinary symptoms secondary to constipation bloating/abdominal symptoms, with inc urinary hesitancy and frequency. - Now resolved after BM - Some abnormal results on his urine dipstick today will send for urine culture, if abnormal will call patient and treat with antibiotic   No orders of the defined types were placed in this encounter.     Follow up plan: Return if symptoms worsen or fail to improve, for keep apt in November 2021.  Future labs ordered already in November 2021   [name], [facility] 08/15/2020, 1:32 PM

## 2020-08-16 LAB — URINE CULTURE
MICRO NUMBER:: 10999070
Result:: NO GROWTH
SPECIMEN QUALITY:: ADEQUATE

## 2020-09-21 ENCOUNTER — Other Ambulatory Visit: Payer: Self-pay | Admitting: *Deleted

## 2020-09-21 DIAGNOSIS — Z0000 Encounter for general adult medical examination without abnormal findings: Secondary | ICD-10-CM

## 2020-09-21 DIAGNOSIS — N183 Chronic kidney disease, stage 3 unspecified: Secondary | ICD-10-CM

## 2020-09-21 DIAGNOSIS — E785 Hyperlipidemia, unspecified: Secondary | ICD-10-CM

## 2020-09-21 DIAGNOSIS — I129 Hypertensive chronic kidney disease with stage 1 through stage 4 chronic kidney disease, or unspecified chronic kidney disease: Secondary | ICD-10-CM

## 2020-09-21 DIAGNOSIS — N1831 Chronic kidney disease, stage 3a: Secondary | ICD-10-CM

## 2020-09-21 DIAGNOSIS — R7303 Prediabetes: Secondary | ICD-10-CM

## 2020-09-21 DIAGNOSIS — R351 Nocturia: Secondary | ICD-10-CM

## 2020-09-22 ENCOUNTER — Other Ambulatory Visit: Payer: Medicare PPO

## 2020-09-22 ENCOUNTER — Other Ambulatory Visit: Payer: Self-pay

## 2020-09-22 DIAGNOSIS — Z0000 Encounter for general adult medical examination without abnormal findings: Secondary | ICD-10-CM | POA: Diagnosis not present

## 2020-09-22 DIAGNOSIS — I129 Hypertensive chronic kidney disease with stage 1 through stage 4 chronic kidney disease, or unspecified chronic kidney disease: Secondary | ICD-10-CM | POA: Diagnosis not present

## 2020-09-22 DIAGNOSIS — E785 Hyperlipidemia, unspecified: Secondary | ICD-10-CM | POA: Diagnosis not present

## 2020-09-22 DIAGNOSIS — R7303 Prediabetes: Secondary | ICD-10-CM | POA: Diagnosis not present

## 2020-09-22 DIAGNOSIS — N1831 Chronic kidney disease, stage 3a: Secondary | ICD-10-CM | POA: Diagnosis not present

## 2020-09-22 DIAGNOSIS — R351 Nocturia: Secondary | ICD-10-CM | POA: Diagnosis not present

## 2020-09-22 DIAGNOSIS — N183 Chronic kidney disease, stage 3 unspecified: Secondary | ICD-10-CM | POA: Diagnosis not present

## 2020-09-23 LAB — LIPID PANEL
Cholesterol: 135 mg/dL (ref <200)
HDL: 35 mg/dL — ABNORMAL LOW (ref >=40)
LDL Cholesterol (Calc): 74 mg/dL (calc)
Non-HDL Cholesterol (Calc): 100 mg/dL (calc) (ref <130)
Total CHOL/HDL Ratio: 3.9 (calc) (ref <5.0)
Triglycerides: 166 mg/dL — ABNORMAL HIGH (ref <150)

## 2020-09-23 LAB — CBC WITH DIFFERENTIAL/PLATELET
Absolute Monocytes: 768 cells/uL (ref 200–950)
Basophils Absolute: 61 cells/uL (ref 0–200)
Basophils Relative: 0.9 %
Eosinophils Absolute: 857 cells/uL — ABNORMAL HIGH (ref 15–500)
Eosinophils Relative: 12.6 %
HCT: 38.7 % (ref 38.5–50.0)
Hemoglobin: 12.4 g/dL — ABNORMAL LOW (ref 13.2–17.1)
Lymphs Abs: 1360 cells/uL (ref 850–3900)
MCH: 29.6 pg (ref 27.0–33.0)
MCHC: 32 g/dL (ref 32.0–36.0)
MCV: 92.4 fL (ref 80.0–100.0)
MPV: 10.4 fL (ref 7.5–12.5)
Monocytes Relative: 11.3 %
Neutro Abs: 3754 cells/uL (ref 1500–7800)
Neutrophils Relative %: 55.2 %
Platelets: 193 10*3/uL (ref 140–400)
RBC: 4.19 10*6/uL — ABNORMAL LOW (ref 4.20–5.80)
RDW: 12.8 % (ref 11.0–15.0)
Total Lymphocyte: 20 %
WBC: 6.8 10*3/uL (ref 3.8–10.8)

## 2020-09-23 LAB — HEMOGLOBIN A1C
Hgb A1c MFr Bld: 6.4 % of total Hgb — ABNORMAL HIGH (ref <5.7)
Mean Plasma Glucose: 137 (calc)
eAG (mmol/L): 7.6 (calc)

## 2020-09-23 LAB — PSA: PSA: 1.42 ng/mL (ref <=4.0)

## 2020-09-23 LAB — COMPLETE METABOLIC PANEL WITH GFR
AG Ratio: 1.8 (calc) (ref 1.0–2.5)
ALT: 14 U/L (ref 9–46)
AST: 20 U/L (ref 10–35)
Albumin: 4 g/dL (ref 3.6–5.1)
Alkaline phosphatase (APISO): 82 U/L (ref 35–144)
BUN/Creatinine Ratio: 14 (calc) (ref 6–22)
BUN: 22 mg/dL (ref 7–25)
CO2: 24 mmol/L (ref 20–32)
Calcium: 9.2 mg/dL (ref 8.6–10.3)
Chloride: 109 mmol/L (ref 98–110)
Creat: 1.58 mg/dL — ABNORMAL HIGH (ref 0.70–1.18)
GFR, Est African American: 50 mL/min/{1.73_m2} — ABNORMAL LOW (ref >=60)
GFR, Est Non African American: 43 mL/min/{1.73_m2} — ABNORMAL LOW (ref >=60)
Globulin: 2.2 g/dL (calc) (ref 1.9–3.7)
Glucose, Bld: 107 mg/dL — ABNORMAL HIGH (ref 65–99)
Potassium: 4.6 mmol/L (ref 3.5–5.3)
Sodium: 142 mmol/L (ref 135–146)
Total Bilirubin: 0.4 mg/dL (ref 0.2–1.2)
Total Protein: 6.2 g/dL (ref 6.1–8.1)

## 2020-09-23 LAB — TSH: TSH: 1.71 mIU/L (ref 0.40–4.50)

## 2020-09-29 ENCOUNTER — Other Ambulatory Visit: Payer: Self-pay

## 2020-09-29 ENCOUNTER — Ambulatory Visit (INDEPENDENT_AMBULATORY_CARE_PROVIDER_SITE_OTHER): Payer: Medicare PPO | Admitting: Family Medicine

## 2020-09-29 ENCOUNTER — Encounter: Payer: Self-pay | Admitting: Family Medicine

## 2020-09-29 VITALS — BP 116/51 | HR 49 | Temp 97.1°F | Resp 16 | Ht 71.0 in | Wt 233.6 lb

## 2020-09-29 DIAGNOSIS — I2510 Atherosclerotic heart disease of native coronary artery without angina pectoris: Secondary | ICD-10-CM

## 2020-09-29 DIAGNOSIS — Z23 Encounter for immunization: Secondary | ICD-10-CM | POA: Diagnosis not present

## 2020-09-29 DIAGNOSIS — I129 Hypertensive chronic kidney disease with stage 1 through stage 4 chronic kidney disease, or unspecified chronic kidney disease: Secondary | ICD-10-CM

## 2020-09-29 DIAGNOSIS — Z0000 Encounter for general adult medical examination without abnormal findings: Secondary | ICD-10-CM | POA: Diagnosis not present

## 2020-09-29 DIAGNOSIS — F325 Major depressive disorder, single episode, in full remission: Secondary | ICD-10-CM

## 2020-09-29 DIAGNOSIS — N1831 Chronic kidney disease, stage 3a: Secondary | ICD-10-CM

## 2020-09-29 DIAGNOSIS — N183 Chronic kidney disease, stage 3 unspecified: Secondary | ICD-10-CM

## 2020-09-29 DIAGNOSIS — R7303 Prediabetes: Secondary | ICD-10-CM

## 2020-09-29 DIAGNOSIS — K219 Gastro-esophageal reflux disease without esophagitis: Secondary | ICD-10-CM

## 2020-09-29 MED ORDER — LOSARTAN POTASSIUM 25 MG PO TABS: 25.0000 mg | ORAL_TABLET | Freq: Every day | ORAL | 3 refills | Status: DC

## 2020-09-29 MED ORDER — OMEPRAZOLE 40 MG PO CPDR: 40.0000 mg | DELAYED_RELEASE_CAPSULE | Freq: Every day | ORAL | 3 refills | Status: DC

## 2020-09-29 NOTE — Assessment & Plan Note (Signed)
Controlled in remission Taper off SSRI Sertraline, instructions given on AVS May resume or lower dose if prefer to keep on board Consider other med change if needed in future

## 2020-09-29 NOTE — Patient Instructions (Addendum)
Thank you for coming to the office today.  Options 1  Sertraline 50mg  - can cut in half for 25mg  daily if preferred. If still causing the side effect - we can taper off of it.  Medication Taper Off Instructions Current med - Sertraline 50 mg  Week 1: Alternate every OTHER day - Sertraline 50 mg (whole tab) and then next day 50 mg (HALF tab) Week 2: Sertraline 50 mg daily (HALF tablet) Week 3: Sertraline 50 mg every OTHER day (HALF tablet) - alternating day is NO medicine (skip dose) STOP completely  ------  If you want to restart taking 1 a day 50mg  every day. If want to change to a different one - just call and let me know.  Recent Labs    03/22/20 0749 09/22/20 0809  HGBA1C 6.1* 6.4*     Please schedule a Follow-up Appointment to: Return in about 6 months (around 03/29/2021) for 6 month follow-up PreDM A1c, HTN.  If you have any other questions or concerns, please feel free to call the office or send a message through [place]. You may also schedule an earlier appointment if necessary.  Additionally, you may be receiving a survey about your experience at our office within a few days to 1 week by e-mail or mail. We value your feedback.  [name], DO [place]

## 2020-09-29 NOTE — Assessment & Plan Note (Signed)
Elevated A1c to 6.4 on last A1c lab Discussed lifestyle diet exercise regimen, low carb low sugar

## 2020-09-29 NOTE — Assessment & Plan Note (Signed)
Stable, medically managed, s/p RCA STEMI and PCI Followed by [facility] On ASA, high intensity statin, ARB, BB With LDL goal < 70 

## 2020-09-29 NOTE — Progress Notes (Signed)
Subjective:    Patient ID: George Jensen, male    DOB: 1946/07/29, 73 y.o.   MRN: [id number]  George Jensen is a 74 y.o. male presenting on 09/29/2020 for Annual Exam   HPI   Pre-Diabetes: Last lab result6.4.Previously 6.3. CBGs: Does not check CBG Meds: No medication(never on med) Currently on ARB Lifestyle: -Diet (states not adhering to low carb low sugar lifestyle) -Exercise (workingoutdoors, when weather nice, but not always exercising with walking) - Taking ASA 32m, Statin Denies hypoglycemia, polyuria, visual changes, numbness or tingling.  CHRONIC HTN/ CKD-III Hyperkalemia resolved Known chronic CKD-III- improved Cr and GFR on last lab. Resolved HyperK Reports no concerns.Home BP readings arenormal range Current Meds - Metoprolol 235mBID, Losartan 2510meports good compliance, took meds today. Tolerating well, w/o complaints. - Tries to stay well hydrated with water. Admits had eaten many bananas lately Denies CP, dyspnea, HA, edema, dizziness / lightheadedness  Mild Anemia, Normocytic CBC lab showsHgb improved to 12.8 He denies symptoms  COPD, Former Smoker [facility], he was restarted on Spiriva 34m45mnce daily doing well - Rarely using Albuterol PRN  Major Depression in Remission No new concerns, see PHQ On Sertralinenow wants to come off has some nightmares.  Health Maintenance: UTD COVID19 vaccine Moderna 1st dose 03/24/20, next dose 04/21/20  Due for Flu Shot, will receive today   Colon CA Screening - Due for Cologuard, send kit back anytime.  PSA - 1.42 negative.  Depression screen PHQ [facility] 09/29/2020 03/29/2020 09/25/2019  Decreased Interest 0 0 0  Down, Depressed, Hopeless 0 0 0  PHQ - 2 Score 0 0 0  Altered sleeping 0 0 1  Tired, decreased energy 0 0 1  Change in appetite 0 0 0  Feeling bad or failure about yourself  0 0 0  Trouble concentrating 0 0 1  Moving slowly or fidgety/restless  0 0 0  Suicidal thoughts 0 0 0  PHQ-9 Score 0 0 3  Difficult doing work/chores Not difficult at all Not difficult at all Somewhat difficult   GAD 7 : Generalized Anxiety Score 09/29/2020 09/25/2019  Nervous, Anxious, on Edge 0 0  Control/stop worrying 0 1  Worry too much - different things 0 1  Trouble relaxing 0 0  Restless 0 0  Easily annoyed or irritable 0 1  Afraid - awful might happen 0 0  Total GAD 7 Score 0 3  Anxiety Difficulty Not difficult at all Somewhat difficult      Past Medical History:  Diagnosis Date  . BCC (basal cell carcinoma)    x 2 on face  . COPD (chronic obstructive pulmonary disease) (HCC)[place]. Hyperlipidemia   . Hypertension   . Melanoma (HCC)[place]. Myocardial infarction (HCC)[place]/2015  . SCCA (squamous cell carcinoma) of skin    SCC x 1 on face   Past Surgical History:  Procedure Laterality Date  . CAROTID STENT  10/2014   3 STENTS [facility]  . MELANOMA EXCISION  1982   Social History   Socioeconomic History  . Marital status: Married    Spouse name: JoycCalton HarshfieldNumber of children: 4  . Years of education: Not on file  . Highest education level: 9th grade  Occupational History  . Occupation: Heavy MachRadio producerbacco Use  . Smoking status: Former Smoker    Packs/day: 2.00    Types: Cigarettes    Quit date:  10/19/2014    Years since quitting: 5.9  . Smokeless tobacco: Former Network engineer  . Vaping Use: Never used  Substance and Sexual Activity  . Alcohol use: No  . Drug use: No  . Sexual activity: Not on file  Other Topics Concern  . Not on file  Social History Narrative  . Not on file   Social Determinants of Health   Financial Resource Strain: Low Risk   . Difficulty of Paying Living Expenses: Not hard at all  Food Insecurity: No Food Insecurity  . Worried About Charity fundraiser in the Last Year: Never true  . Ran Out of Food in the Last Year: Never true  Transportation Needs: No Transportation Needs  .  Lack of Transportation (Medical): No  . Lack of Transportation (Non-Medical): No  Physical Activity: Inactive  . Days of Exercise per Week: 0 days  . Minutes of Exercise per Session: 0 min  Stress:   . Feeling of Stress : Not on file  Social Connections: Moderately Isolated  . Frequency of Communication with Friends and Family: More than three times a week  . Frequency of Social Gatherings with Friends and Family: More than three times a week  . Attends Religious Services: Never  . Active Member of Clubs or Organizations: No  . Attends Archivist Meetings: Never  . Marital Status: Married  Human resources officer Violence:   . Fear of Current or Ex-Partner: Not on file  . Emotionally Abused: Not on file  . Physically Abused: Not on file  . Sexually Abused: Not on file   Family History  Problem Relation Age of Onset  . Alzheimer's disease Mother   . Diabetes Father   . Heart disease Father    Current Outpatient Medications on File Prior to Visit  Medication Sig  . albuterol (PROVENTIL HFA;VENTOLIN HFA) 108 (90 BASE) MCG/ACT inhaler Inhale 2 puffs into the lungs every 4 (four) hours as needed for wheezing or shortness of breath (or shortness of breath).  Marland Kitchen aspirin 81 MG EC tablet TAKE 1 TABLET EVERY DAY  . atorvastatin (LIPITOR) 80 MG tablet Take 80 mg by mouth daily at 6 PM.   . metoprolol tartrate (LOPRESSOR) 25 MG tablet Take 25 mg by mouth 2 (two) times daily.   . Multiple Vitamins-Minerals (MULTIVITAMIN MEN 50+) TABS Take daily by mouth.  . nitroGLYCERIN (NITROSTAT) 0.4 MG SL tablet Place 0.4 mg under the tongue every 5 (five) minutes as needed for chest pain. AS NEEDED  . ezetimibe (ZETIA) 10 MG tablet Take 10 mg by mouth daily.   Marland Kitchen tiotropium (SPIRIVA) 18 MCG inhalation capsule Place 18 mcg into inhaler and inhale every other day.    No current facility-administered medications on file prior to visit.    Review of Systems  Constitutional: Negative for activity change,  appetite change, chills, diaphoresis, fatigue and fever.  HENT: Negative for congestion and hearing loss.   Eyes: Negative for visual disturbance.  Respiratory: Negative for cough, chest tightness, shortness of breath and wheezing.   Cardiovascular: Negative for chest pain, palpitations and leg swelling.  Gastrointestinal: Negative for abdominal pain, constipation, diarrhea, nausea and vomiting.  Genitourinary: Negative for dysuria, frequency and hematuria.  Musculoskeletal: Negative for arthralgias and neck pain.  Skin: Negative for rash.  Neurological: Negative for dizziness, weakness, light-headedness, numbness and headaches.  Hematological: Negative for adenopathy.  Psychiatric/Behavioral: Negative for behavioral problems, dysphoric mood and sleep disturbance.   Per HPI unless specifically indicated above  Objective:    BP (!) 116/51   Pulse (!) 49   Temp (!) 97.1 F (36.2 C) (Temporal)   Resp 16   Ht _0  (1.803 m)   Wt 233 lb 9.6 oz (106 kg)   SpO2 96%   BMI 32.58 kg/m   Wt Readings from Last 3 Encounters:  09/29/20 233 lb 9.6 oz (106 kg)  08/15/20 225 lb 9.6 oz (102.3 kg)  03/29/20 230 lb (104.3 kg)    Physical Exam Vitals and nursing note reviewed.  Constitutional:      General: He is not in acute distress.    Appearance: He is well-developed. He is not diaphoretic.     Comments: Well-appearing, comfortable, cooperative  HENT:     Head: Normocephalic and atraumatic.  Eyes:     General:        Right eye: No discharge.        Left eye: No discharge.     Conjunctiva/sclera: Conjunctivae normal.     Pupils: Pupils are equal, round, and reactive to light.  Neck:     Thyroid: No thyromegaly.  Cardiovascular:     Rate and Rhythm: Normal rate and regular rhythm.     Heart sounds: Normal heart sounds. No murmur heard.   Pulmonary:     Effort: Pulmonary effort is normal. No respiratory distress.     Breath sounds: Normal breath sounds. No wheezing or rales.   Abdominal:     General: Bowel sounds are normal. There is no distension.     Palpations: Abdomen is soft. There is no mass.     Tenderness: There is no abdominal tenderness.  Musculoskeletal:        General: No tenderness. Normal range of motion.     Cervical back: Normal range of motion and neck supple.     Comments: Upper / Lower Extremities: - Normal muscle tone, strength bilateral upper extremities 5/5, lower extremities 5/5  Lymphadenopathy:     Cervical: No cervical adenopathy.  Skin:    General: Skin is warm and dry.     Findings: No erythema or rash.  Neurological:     Mental Status: He is alert and oriented to person, place, and time.     Comments: Distal sensation intact to light touch all extremities  Psychiatric:        Behavior: Behavior normal.     Comments: Well groomed, good eye contact, normal speech and thoughts    Results for orders placed or performed in visit on 09/21/20  TSH  Result Value Ref Range   TSH 1.71 0.40 - 4.50 mIU/L  PSA  Result Value Ref Range   PSA 1.42 < OR = 4.0 ng/mL  Lipid panel  Result Value Ref Range   Cholesterol 135 <200 mg/dL   HDL 35 (L) > OR = 40 mg/dL   Triglycerides 166 (H) <150 mg/dL   LDL Cholesterol (Calc) 74 mg/dL (calc)   Total CHOL/HDL Ratio 3.9 <5.0 (calc)   Non-HDL Cholesterol (Calc) 100 <130 mg/dL (calc)  COMPLETE METABOLIC PANEL WITH GFR  Result Value Ref Range   Glucose, Bld 107 (H) 65 - 99 mg/dL   BUN 22 7 - 25 mg/dL   Creat 1.58 (H) 0.70 - 1.18 mg/dL   GFR, Est Non African American 43 (L) > OR = 60 mL/min/1.88m   GFR, Est African American 50 (L) > OR = 60 mL/min/1.763m  BUN/Creatinine Ratio 14 6 - 22 (calc)   Sodium 142 135 -  146 mmol/L   Potassium 4.6 3.5 - 5.3 mmol/L   Chloride 109 98 - 110 mmol/L   CO2 24 20 - 32 mmol/L   Calcium 9.2 8.6 - 10.3 mg/dL   Total Protein 6.2 6.1 - 8.1 g/dL   Albumin 4.0 3.6 - 5.1 g/dL   Globulin 2.2 1.9 - 3.7 g/dL (calc)   AG Ratio 1.8 1.0 - 2.5 (calc)   Total  Bilirubin 0.4 0.2 - 1.2 mg/dL   Alkaline phosphatase (APISO) 82 35 - 144 U/L   AST 20 10 - 35 U/L   ALT 14 9 - 46 U/L  CBC with Differential/Platelet  Result Value Ref Range   WBC 6.8 3.8 - 10.8 Thousand/uL   RBC 4.19 (L) 4.20 - 5.80 Million/uL   Hemoglobin 12.4 (L) 13.2 - 17.1 g/dL   HCT 38.7 38 - 50 %   MCV 92.4 80.0 - 100.0 fL   MCH 29.6 27.0 - 33.0 pg   MCHC 32.0 32.0 - 36.0 g/dL   RDW 12.8 11.0 - 15.0 %   Platelets 193 140 - 400 Thousand/uL   MPV 10.4 7.5 - 12.5 fL   Neutro Abs 3,754 1,500 - 7,800 cells/uL   Lymphs Abs 1,360 850 - 3,900 cells/uL   Absolute Monocytes 768 200 - 950 cells/uL   Eosinophils Absolute 857 (H) 15.0 - 500.0 cells/uL   Basophils Absolute 61 0.0 - 200.0 cells/uL   Neutrophils Relative % 55.2 %   Total Lymphocyte 20.0 %   Monocytes Relative 11.3 %   Eosinophils Relative 12.6 %   Basophils Relative 0.9 %  Hemoglobin A1c  Result Value Ref Range   Hgb A1c MFr Bld 6.4 (H) <5.7 % of total Hgb   Mean Plasma Glucose 137 (calc)   eAG (mmol/L) 7.6 (calc)      Assessment & Plan:   Problem List Items Addressed This Visit    Pre-diabetes    Elevated A1c to 6.4 on last A1c lab Discussed lifestyle diet exercise regimen, low carb low sugar      Major depression in remission (HCC)    Controlled in remission Taper off SSRI Sertraline, instructions given on AVS May resume or lower dose if prefer to keep on board Consider other med change if needed in future      GERD (gastroesophageal reflux disease)   Relevant Medications   omeprazole (PRILOSEC) 40 MG capsule   CKD (chronic kidney disease), stage III (HCC)   CAD, multiple vessel    Stable, medically managed, s/p RCA STEMI and PCI Followed by [facility] On ASA, high intensity statin, ARB, BB With LDL goal < 70      Relevant Medications   losartan (COZAAR) 25 MG tablet   Benign hypertension with CKD (chronic kidney disease) stage III (HCC)    Controlled HTN - Home BP readings  normal Complication with CKD-III   Plan:  1. Continue current BP regimen - Metoprolol 47m BID, Losartan 218mdaily - continue ARB with CKD 2. Encourage improved lifestyle - low sodium diet, regular exercise 3. Continue monitor BP outside office, bring readings to next visit, if persistently >140/90 or new symptoms notify office sooner 4. Follow-up 6 month      Relevant Medications   losartan (COZAAR) 25 MG tablet    Other Visit Diagnoses    Annual physical exam    -  Primary   Needs flu shot       Relevant Orders   Flu Vaccine QUAD High Dose(Fluad) (Completed)  Updated Health Maintenance information - Flu shot today - Needs to complete previous Cologuard test - PSA negative Reviewed recent lab results with patient Encouraged improvement to lifestyle with diet and exercise - Goal of weight loss   Meds ordered this encounter  Medications  . losartan (COZAAR) 25 MG tablet    Sig: Take 1 tablet (25 mg total) by mouth daily.    Dispense:  90 tablet    Refill:  3  . omeprazole (PRILOSEC) 40 MG capsule    Sig: Take 1 capsule (40 mg total) by mouth daily.    Dispense:  90 capsule    Refill:  3      Follow up plan: Return in about 6 months (around 03/29/2021) for 6 month follow-up PreDM A1c, HTN.  [name], [facility] 09/29/2020, 8:22 AM

## 2020-09-29 NOTE — Assessment & Plan Note (Signed)
Controlled HTN - Home BP readings normal Complication with CKD-III   Plan:  1. Continue current BP regimen - Metoprolol 25mg  BID, Losartan 25mg  daily - continue ARB with CKD 2. Encourage improved lifestyle - low sodium diet, regular exercise 3. Continue monitor BP outside office, bring readings to next visit, if persistently >140/90 or new symptoms notify office sooner 4. Follow-up 6 month

## 2020-12-30 DIAGNOSIS — R0600 Dyspnea, unspecified: Secondary | ICD-10-CM | POA: Diagnosis not present

## 2020-12-30 DIAGNOSIS — J449 Chronic obstructive pulmonary disease, unspecified: Secondary | ICD-10-CM | POA: Diagnosis not present

## 2021-01-19 ENCOUNTER — Other Ambulatory Visit: Payer: Self-pay

## 2021-01-19 DIAGNOSIS — F325 Major depressive disorder, single episode, in full remission: Secondary | ICD-10-CM

## 2021-01-19 MED ORDER — SERTRALINE HCL 50 MG PO TABS: 50.0000 mg | ORAL_TABLET | Freq: Every day | ORAL | 3 refills | Status: DC

## 2021-02-17 DIAGNOSIS — J432 Centrilobular emphysema: Secondary | ICD-10-CM | POA: Diagnosis not present

## 2021-02-17 DIAGNOSIS — I252 Old myocardial infarction: Secondary | ICD-10-CM | POA: Diagnosis not present

## 2021-02-17 DIAGNOSIS — E785 Hyperlipidemia, unspecified: Secondary | ICD-10-CM | POA: Diagnosis not present

## 2021-02-17 DIAGNOSIS — I2111 ST elevation (STEMI) myocardial infarction involving right coronary artery: Secondary | ICD-10-CM | POA: Diagnosis not present

## 2021-02-17 DIAGNOSIS — I2510 Atherosclerotic heart disease of native coronary artery without angina pectoris: Secondary | ICD-10-CM | POA: Diagnosis not present

## 2021-03-31 ENCOUNTER — Ambulatory Visit: Payer: Medicare PPO | Admitting: Family Medicine

## 2021-03-31 ENCOUNTER — Other Ambulatory Visit: Payer: Self-pay

## 2021-03-31 ENCOUNTER — Other Ambulatory Visit: Payer: Self-pay | Admitting: Family Medicine

## 2021-03-31 ENCOUNTER — Encounter: Payer: Self-pay | Admitting: Family Medicine

## 2021-03-31 VITALS — BP 107/55 | HR 55 | Temp 97.6°F | Ht 71.0 in | Wt 228.0 lb

## 2021-03-31 DIAGNOSIS — I129 Hypertensive chronic kidney disease with stage 1 through stage 4 chronic kidney disease, or unspecified chronic kidney disease: Secondary | ICD-10-CM

## 2021-03-31 DIAGNOSIS — F325 Major depressive disorder, single episode, in full remission: Secondary | ICD-10-CM | POA: Diagnosis not present

## 2021-03-31 DIAGNOSIS — N183 Chronic kidney disease, stage 3 unspecified: Secondary | ICD-10-CM

## 2021-03-31 DIAGNOSIS — K219 Gastro-esophageal reflux disease without esophagitis: Secondary | ICD-10-CM

## 2021-03-31 DIAGNOSIS — N1831 Chronic kidney disease, stage 3a: Secondary | ICD-10-CM

## 2021-03-31 DIAGNOSIS — J431 Panlobular emphysema: Secondary | ICD-10-CM | POA: Diagnosis not present

## 2021-03-31 DIAGNOSIS — R7303 Prediabetes: Secondary | ICD-10-CM

## 2021-03-31 DIAGNOSIS — R351 Nocturia: Secondary | ICD-10-CM

## 2021-03-31 DIAGNOSIS — Z0000 Encounter for general adult medical examination without abnormal findings: Secondary | ICD-10-CM

## 2021-03-31 DIAGNOSIS — E785 Hyperlipidemia, unspecified: Secondary | ICD-10-CM

## 2021-03-31 LAB — POCT GLYCOSYLATED HEMOGLOBIN (HGB A1C): Hemoglobin A1C: 6.2 % — AB (ref 4.0–5.6)

## 2021-03-31 MED ORDER — OMEPRAZOLE 40 MG PO CPDR: 40.0000 mg | DELAYED_RELEASE_CAPSULE | Freq: Every day | ORAL | 3 refills | Status: DC

## 2021-03-31 NOTE — Assessment & Plan Note (Addendum)
Stable, without flare Followed by Dr Raul Del [facility] Pulm Continues on Spiriva Albuterol PRN Remains smoke free

## 2021-03-31 NOTE — Progress Notes (Signed)
Subjective:    Patient ID: [name], male    DOB: [birth year], 75 y.o.   MRN: [id number]  ZIMERE DUNLEVY is a 75 y.o. male presenting on 03/31/2021 for Hypertension and Prediabetes   HPI   Pre-Diabetes: Results A1c down to 6.2 CBGs: Does not check CBG Meds: No medication(never on med) Currently on ARB Lifestyle: -Diet (states not adhering to low carb low sugar lifestyle) -Exercise (workingoutdoors, when weather nice, but not always exercising with walking) - Taking ASA 81mg , Statin Denies hypoglycemia, polyuria, visual changes, numbness or tingling.  CHRONIC HTN/ CKD-III Hyperkalemia resolved Known chronic CKD-III- improved Cr and GFR on last lab. Reports no concerns.Home BP readings arenormal range Current Meds - Metoprolol 25mg  BID, Losartan 25mg  Reports good compliance, took meds today. Tolerating well, w/o complaints. - Tries to stay well hydrated with water. Admits had eaten many bananas lately Denies CP, dyspnea, HA, edema, dizziness / lightheadedness  Panlobular Emphysema COPD, Former Smoker Followed by Dr Raul Del, good report recently On Spiriva - Rarely using Albuterol PRN  Major Depression in Remission No new concerns, see PHQ On Sertraline 50mg  daily 50mg  daily.   Depression screen [facility] 2/9 03/31/2021 09/29/2020 03/29/2020  Decreased Interest 0 0 0  Down, Depressed, Hopeless 0 0 0  PHQ - 2 Score 0 0 0  Altered sleeping 0 0 0  Tired, decreased energy 0 0 0  Change in appetite 0 0 0  Feeling bad or failure about yourself  0 0 0  Trouble concentrating 0 0 0  Moving slowly or fidgety/restless 0 0 0  Suicidal thoughts 0 0 0  PHQ-9 Score 0 0 0  Difficult doing work/chores Not difficult at all Not difficult at all Not difficult at all  Some recent data might be hidden    Social History   Tobacco Use  . Smoking status: Former Smoker    Packs/day: 2.00    Types: Cigarettes    Quit date: 10/19/2014    Years since quitting: 6.4  .  Smokeless tobacco: Former Network engineer  . Vaping Use: Never used  Substance Use Topics  . Alcohol use: No  . Drug use: No    Review of Systems Per HPI unless specifically indicated above     Objective:    BP (!) 107/55   Pulse (!) 55   Temp 97.6 F (36.4 C) (Oral)   Ht 5\' 11"  (1.803 m)   Wt 228 lb (103.4 kg)   SpO2 97%   BMI 31.80 kg/m   Wt Readings from Last 3 Encounters:  03/31/21 228 lb (103.4 kg)  09/29/20 233 lb 9.6 oz (106 kg)  08/15/20 225 lb 9.6 oz (102.3 kg)    Physical Exam Vitals and nursing note reviewed.  Constitutional:      General: He is not in acute distress.    Appearance: He is well-developed. He is not diaphoretic.     Comments: Well-appearing, comfortable, cooperative  HENT:     Head: Normocephalic and atraumatic.  Eyes:     General:        Right eye: No discharge.        Left eye: No discharge.     Conjunctiva/sclera: Conjunctivae normal.  Cardiovascular:     Rate and Rhythm: Normal rate.  Pulmonary:     Effort: Pulmonary effort is normal.  Skin:    General: Skin is warm and dry.     Findings: No erythema or rash.  Neurological:  Mental Status: He is alert and oriented to person, place, and time.  Psychiatric:        Behavior: Behavior normal.     Comments: Well groomed, good eye contact, normal speech and thoughts      Recent Labs    09/22/20 0809 03/31/21 0846  HGBA1C 6.4* 6.2*     Results for orders placed or performed in visit on 03/31/21  POCT HgB A1C  Result Value Ref Range   Hemoglobin A1C 6.2 (A) 4.0 - 5.6 %      Assessment & Plan:   Problem List Items Addressed This Visit    Pulmonary emphysema ([place])    Stable, without flare Followed by Dr Minerva Areola Pulm Continues on Spiriva Albuterol PRN Remains smoke free      Pre-diabetes - Primary    Improved PreDM A1c 6.4 down to 6.2 Continue lifestyle improvement      Relevant Orders   POCT HgB A1C (Completed)   Major depression in remission ([place])     Controlled in remission Continues SSRI Sertraline still, did not end up discontinuing Consider other med change if needed in future      GERD (gastroesophageal reflux disease)   Relevant Medications   omeprazole (PRILOSEC) 40 MG capsule   CKD (chronic kidney disease), stage III (HCC)    Stable CKD III setting of HTN, PreDM  Plan: 1. Continue to control BP, A1c - both improved 2. Continue to improve hydration 3. Avoid NSAIDs 4. FUTURE potential for referral to Nephrology 5. Follow-up 6 months lab      Benign hypertension with CKD (chronic kidney disease) stage III (HCC)    Controlled HTN - Home BP readings normal Complication with CKD-III   Plan:  1. Continue current BP regimen - Metoprolol 25mg  BID, Losartan 25mg  daily - continue ARB with CKD 2. Encourage improved lifestyle - low sodium diet, regular exercise 3. Continue monitor BP outside office, bring readings to next visit, if persistently >140/90 or new symptoms notify office sooner 4. Follow-up 6 month         Meds ordered this encounter  Medications  . omeprazole (PRILOSEC) 40 MG capsule    Sig: Take 1 capsule (40 mg total) by mouth daily.    Dispense:  90 capsule    Refill:  3     Follow up plan: Return in about 6 months (around 10/01/2021) for 6 month fasting lab only then 1 week later Annual Physical.  Future labs ordered for 09/29/21   [name], [facility] 03/31/2021, 9:01 AM

## 2021-03-31 NOTE — Progress Notes (Signed)
potc

## 2021-03-31 NOTE — Assessment & Plan Note (Signed)
Controlled HTN - Home BP readings normal Complication with CKD-III   Plan:  1. Continue current BP regimen - Metoprolol 25mg BID, Losartan 25mg daily - continue ARB with CKD 2. Encourage improved lifestyle - low sodium diet, regular exercise 3. Continue monitor BP outside office, bring readings to next visit, if persistently >140/90 or new symptoms notify office sooner 4. Follow-up 6 month 

## 2021-03-31 NOTE — Assessment & Plan Note (Signed)
Stable CKD III setting of HTN, PreDM  Plan: 1. Continue to control BP, A1c - both improved 2. Continue to improve hydration 3. Avoid NSAIDs 4. FUTURE potential for referral to Nephrology 5. Follow-up 6 months lab

## 2021-03-31 NOTE — Assessment & Plan Note (Signed)
Improved PreDM A1c 6.4 down to 6.2 Continue lifestyle improvement

## 2021-03-31 NOTE — Patient Instructions (Addendum)
Thank you for coming to the office today.  Recent Labs    09/22/20 0809 03/31/21 0846  HGBA1C 6.4* 6.2*   Great job with the blood sugar. Keep up the good work!  DUE for FASTING BLOOD WORK (no food or drink after midnight before the lab appointment, only water or coffee without cream/sugar on the morning of)  SCHEDULE "Lab Only" visit in the morning at the clinic for lab draw in 6 MONTHS   - Make sure Lab Only appointment is at about 1 week before your next appointment, so that results will be available  For Lab Results, once available within 2-3 days of blood draw, you can can log in to MyChart online to view your results and a brief explanation. Also, we can discuss results at next follow-up visit.   Please schedule a Follow-up Appointment to: Return in about 6 months (around 10/01/2021) for 6 month fasting lab only then 1 week later Annual Physical.  If you have any other questions or concerns, please feel free to call the office or send a message through [place]. You may also schedule an earlier appointment if necessary.  Additionally, you may be receiving a survey about your experience at our office within a few days to 1 week by e-mail or mail. We value your feedback.  [name], DO [place]

## 2021-03-31 NOTE — Assessment & Plan Note (Signed)
Controlled in remission Continues SSRI Sertraline still, did not end up discontinuing Consider other med change if needed in future

## 2021-04-04 ENCOUNTER — Ambulatory Visit (INDEPENDENT_AMBULATORY_CARE_PROVIDER_SITE_OTHER): Payer: Medicare PPO

## 2021-04-04 VITALS — Ht 71.0 in | Wt 228.0 lb

## 2021-04-04 DIAGNOSIS — Z0000 Encounter for general adult medical examination without abnormal findings: Secondary | ICD-10-CM

## 2021-04-04 NOTE — Progress Notes (Signed)
I connected with George Jensen today by telephone and verified that I am speaking with the correct person using two identifiers. Location patient: home Location provider: work Persons participating in the virtual visit: Vaun Hyndman, Riki Gehring (wife), Glenna Durand LPN.   I discussed the limitations, risks, security and privacy concerns of performing an evaluation and management service by telephone and the availability of in person appointments. I also discussed with the patient that there may be a patient responsible charge related to this service. The patient expressed understanding and verbally consented to this telephonic visit.    Interactive audio and video telecommunications were attempted between this provider and patient, however failed, due to patient having technical difficulties OR patient did not have access to video capability.  We continued and completed visit with audio only.     Vital signs may be patient reported or missing.  Subjective:   George Jensen is a 75 y.o. male who presents for Medicare Annual/Subsequent preventive examination.  Review of Systems     Cardiac Risk Factors include: advanced age (>23men, >53 women);dyslipidemia;hypertension;male gender;obesity (BMI >30kg/m2)     Objective:    Today's Vitals   04/04/21 0857  Weight: 228 lb (103.4 kg)  Height: 5\' 11"  (1.803 m)   Body mass index is 31.8 kg/m.  Advanced Directives 04/04/2021 03/29/2020 10/07/2018 06/11/2017 09/16/2015  Does Patient Have a Medical Advance Directive? No No No Yes No  Type of Advance Directive - - Public librarian;Living will -  Copy of [place] in Chart? - - - No - copy requested -  Would patient like information on creating a medical advance directive? - No - Patient declined - - No - patient declined information    Current Medications (verified) Outpatient Encounter Medications as of 04/04/2021  Medication Sig  . albuterol  (PROVENTIL HFA;VENTOLIN HFA) 108 (90 BASE) MCG/ACT inhaler Inhale 2 puffs into the lungs every 4 (four) hours as needed for wheezing or shortness of breath (or shortness of breath).  Marland Kitchen aspirin 81 MG EC tablet TAKE 1 TABLET EVERY DAY  . atorvastatin (LIPITOR) 80 MG tablet Take 80 mg by mouth daily at 6 PM.   . ezetimibe (ZETIA) 10 MG tablet Take 10 mg by mouth daily.   Marland Kitchen losartan (COZAAR) 25 MG tablet Take 1 tablet (25 mg total) by mouth daily.  . metoprolol tartrate (LOPRESSOR) 25 MG tablet Take 25 mg by mouth 2 (two) times daily.   . Multiple Vitamins-Minerals (MULTIVITAMIN MEN 50+) TABS Take daily by mouth.  . nitroGLYCERIN (NITROSTAT) 0.4 MG SL tablet Place 0.4 mg under the tongue every 5 (five) minutes as needed for chest pain. AS NEEDED  . omeprazole (PRILOSEC) 40 MG capsule Take 1 capsule (40 mg total) by mouth daily.  . sertraline (ZOLOFT) 50 MG tablet Take 1 tablet (50 mg total) by mouth daily.  Marland Kitchen tiotropium (SPIRIVA) 18 MCG inhalation capsule Place 18 mcg into inhaler and inhale every other day.    No facility-administered encounter medications on file as of 04/04/2021.    Allergies (verified) Patient has no known allergies.   History: Past Medical History:  Diagnosis Date  . BCC (basal cell carcinoma)    x 2 on face  . COPD (chronic obstructive pulmonary disease) ([place])   . Hyperlipidemia   . Hypertension   . Melanoma ([place])   . Myocardial infarction ([place]) 10/2014  . SCCA (squamous cell carcinoma) of skin    SCC x 1 on face  Past Surgical History:  Procedure Laterality Date  . CAROTID STENT  10/2014   3 STENTS [facility]  . MELANOMA EXCISION  1982   Family History  Problem Relation Age of Onset  . Alzheimer's disease Mother   . Diabetes Father   . Heart disease Father    Social History   Socioeconomic History  . Marital status: Married    Spouse name: Zyree Traynham  . Number of children: 4  . Years of education: Not on file  . Highest education level: 9th grade   Occupational History  . Occupation: Heavy Radio producer  Tobacco Use  . Smoking status: Former Smoker    Packs/day: 2.00    Types: Cigarettes    Quit date: 10/19/2014    Years since quitting: 6.4  . Smokeless tobacco: Former Network engineer  . Vaping Use: Never used  Substance and Sexual Activity  . Alcohol use: No  . Drug use: No  . Sexual activity: Not on file  Other Topics Concern  . Not on file  Social History Narrative  . Not on file   Social Determinants of Health   Financial Resource Strain: Low Risk   . Difficulty of Paying Living Expenses: Not hard at all  Food Insecurity: No Food Insecurity  . Worried About Charity fundraiser in the Last Year: Never true  . Ran Out of Food in the Last Year: Never true  Transportation Needs: No Transportation Needs  . Lack of Transportation (Medical): No  . Lack of Transportation (Non-Medical): No  Physical Activity: Insufficiently Active  . Days of Exercise per Week: 6 days  . Minutes of Exercise per Session: 20 min  Stress: No Stress Concern Present  . Feeling of Stress : Not at all  Social Connections: Not on file    Tobacco Counseling Counseling given: Not Answered   Clinical Intake:  Pre-visit preparation completed: Yes  Pain : No/denies pain     Nutritional Status: BMI > 30  Obese Nutritional Risks: None Diabetes: No  How often do you need to have someone help you when you read instructions, pamphlets, or other written materials from your doctor or pharmacy?: 1 - Never What is the last grade level you completed in school?: 9th grade  Diabetic? no  Interpreter Needed?: No  Information entered by :: NAllen LPN   Activities of Daily Living In your present state of health, do you have any difficulty performing the following activities: 04/04/2021 03/31/2021  Hearing? N N  Vision? N N  Difficulty concentrating or making decisions? N N  Walking or climbing stairs? N N  Dressing or bathing? N N   Doing errands, shopping? N N  Preparing Food and eating ? N -  Using the Toilet? N -  In the past six months, have you accidently leaked urine? N -  Do you have problems with loss of bowel control? N -  Managing your Medications? N -  Managing your Finances? N -  Housekeeping or managing your Housekeeping? N -  Some recent data might be hidden    Patient Care Team: Olin Hauser, DO as PCP - General (Family Medicine) Erby Pian, MD as Referring Physician (Specialist) Anell Barr, OD (Optometry) Jannet Mantis, MD (Dermatology) Isaias Cowman, MD as Consulting Physician (Cardiology)  Indicate any recent Medical Services you may have received from other than [facility] providers in the past year (date may be approximate).     Assessment:  This is a routine wellness examination for George Jensen.  Hearing/Vision screen  Hearing Screening   125Hz  250Hz  500Hz  1000Hz  2000Hz  3000Hz  4000Hz  6000Hz  8000Hz   Right ear:           Left ear:           Vision Screening Comments: No regular eye exams, Dr. Ellin Mayhew  Dietary issues and exercise activities discussed: Current Exercise Habits: Home exercise routine, Type of exercise: walking, Time (Minutes): 20, Frequency (Times/Week): 6, Weekly Exercise (Minutes/Week): 120  Goals Addressed            This Visit's Progress   . Patient Stated       04/04/2021, no goals      Depression Screen PHQ 2/9 Scores 04/04/2021 03/31/2021 09/29/2020 03/29/2020 09/25/2019 04/09/2019 10/07/2018  PHQ - 2 Score 0 0 0 0 0 0 0  PHQ- 9 Score - 0 0 0 3 - -    Fall Risk Fall Risk  04/04/2021 03/31/2021 09/29/2020 03/29/2020 09/25/2019  Falls in the past year? 0 0 0 0 0  Number falls in past yr: - - 0 0 0  Injury with Fall? - - 0 0 -  Risk for fall due to : Medication side effect - - - -  Follow up Falls evaluation completed;Education provided;Falls prevention discussed Falls evaluation completed Falls evaluation completed Falls evaluation  completed Falls evaluation completed    FALL RISK PREVENTION PERTAINING TO THE HOME:  Any stairs in or around the home? Yes  If so, are there any without handrails? No  Home free of loose throw rugs in walkways, pet beds, electrical cords, etc? Yes  Adequate lighting in your home to reduce risk of falls? Yes   ASSISTIVE DEVICES UTILIZED TO PREVENT FALLS:  Life alert? No  Use of a cane, walker or w/c? No  Grab bars in the bathroom? No  Shower chair or bench in shower? Yes  Elevated toilet seat or a handicapped toilet? Yes   TIMED UP AND GO:  Was the test performed? No .   Cognitive Function:     6CIT Screen 04/04/2021 10/07/2018 06/11/2017  What Year? 0 points 0 points 0 points  What month? 0 points 0 points 0 points  What time? 0 points 0 points 0 points  Count back from 20 0 points 0 points 0 points  Months in reverse 2 points 0 points 0 points  Repeat phrase 8 points 2 points 0 points  Total Score 10 2 0    Immunizations Immunization History  Administered Date(s) Administered  . Fluad Quad(high Dose 65+) 09/25/2019, 09/29/2020  . Influenza, High Dose Seasonal PF 08/12/2015, 09/06/2016, 09/20/2017, 10/07/2018  . Moderna Sars-Covid-2 Vaccination 03/24/2020, 04/21/2020, 10/30/2020  . Pneumococcal Conjugate-13 08/12/2015, 09/06/2016  . Pneumococcal Polysaccharide-23 09/27/2017    TDAP status: Up to date  Flu Vaccine status: Up to date  Pneumococcal vaccine status: Up to date  Covid-19 vaccine status: Completed vaccines  Qualifies for Shingles Vaccine? Yes   Zostavax completed No   Shingrix Completed?: No.    Education has been provided regarding the importance of this vaccine. Patient has been advised to call insurance company to determine out of pocket expense if they have not yet received this vaccine. Advised may also receive vaccine at local pharmacy or Health Dept. Verbalized acceptance and understanding.  Screening Tests Health Maintenance  Topic Date Due   . Fecal DNA (Cologuard)  Never done  . COVID-19 Vaccine (4 - Booster for Moderna series) 01/28/2021  . INFLUENZA  VACCINE  06/19/2021  . TETANUS/TDAP  09/15/2025  . Hepatitis C Screening  Completed  . PNA vac Low Risk Adult  Completed  . HPV VACCINES  Aged Out    Health Maintenance  Health Maintenance Due  Topic Date Due  . Fecal DNA (Cologuard)  Never done  . COVID-19 Vaccine (4 - Booster for Moderna series) 01/28/2021    Colorectal cancer screening: Type of screening: Colonoscopy. Completed 11/20/2011. Repeat every 10 years  Lung Cancer Screening: (Low Dose CT Chest recommended if Age 31-80 years, 30 pack-year currently smoking OR have quit w/in 15years.) does not qualify.   Lung Cancer Screening Referral: no  Additional Screening:  Hepatitis C Screening: does qualify; Completed 04/08/2019  Vision Screening: Recommended annual ophthalmology exams for early detection of glaucoma and other disorders of the eye. Is the patient up to date with their annual eye exam?  No  Who is the provider or what is the name of the office in which the patient attends annual eye exams? [facility] If pt is not established with a provider, would they like to be referred to a provider to establish care? No .   Dental Screening: Recommended annual dental exams for proper oral hygiene  Community Resource Referral / Chronic Care Management: CRR required this visit?  No   CCM required this visit?  No      Plan:     I have personally reviewed and noted the following in the patient's chart:   . Medical and social history . Use of alcohol, tobacco or illicit drugs  . Current medications and supplements including opioid prescriptions. Patient is not currently taking opioid prescriptions. . Functional ability and status . Nutritional status . Physical activity . Advanced directives . List of other physicians . Hospitalizations, surgeries, and ER visits in previous 12  months . Vitals . Screenings to include cognitive, depression, and falls . Referrals and appointments  In addition, I have reviewed and discussed with patient certain preventive protocols, quality metrics, and best practice recommendations. A written personalized care plan for preventive services as well as general preventive health recommendations were provided to patient.     Kellie Simmering, LPN   1/61/0960   Nurse Notes:

## 2021-04-04 NOTE — Patient Instructions (Signed)
[name] , Thank you for taking time to come for your Medicare Wellness Visit. I appreciate your ongoing commitment to your health goals. Please review the following plan we discussed and let me know if I can assist you in the future.   Screening recommendations/referrals: Colonoscopy: completed 11/20/2011 Recommended yearly ophthalmology/optometry visit for glaucoma screening and checkup Recommended yearly dental visit for hygiene and checkup  Vaccinations: Influenza vaccine: completed 09/29/2020, due 06/19/2021 Pneumococcal vaccine: completed 09/27/2017 Tdap vaccine: completed 09/16/2015, due 09/15/2025 Shingles vaccine:  discussed   Covid-19: 10/30/2020, 04/21/2020, 03/24/2020  Advanced directives: Advance directive discussed with you today.   Conditions/risks identified: none  Next appointment: Follow up in one year for your annual wellness visit.   Preventive Care 50 Years and Older, Male Preventive care refers to lifestyle choices and visits with your health care provider that can promote health and wellness. What does preventive care include?  A yearly physical exam. This is also called an annual well check.  Dental exams once or twice a year.  Routine eye exams. Ask your health care provider how often you should have your eyes checked.  Personal lifestyle choices, including:  Daily care of your teeth and gums.  Regular physical activity.  Eating a healthy diet.  Avoiding tobacco and drug use.  Limiting alcohol use.  Practicing safe sex.  Taking low doses of aspirin every day.  Taking vitamin and mineral supplements as recommended by your health care provider. What happens during an annual well check? The services and screenings done by your health care provider during your annual well check will depend on your age, overall health, lifestyle risk factors, and family history of disease. Counseling  Your health care provider may ask you questions about  your:  Alcohol use.  Tobacco use.  Drug use.  Emotional well-being.  Home and relationship well-being.  Sexual activity.  Eating habits.  History of falls.  Memory and ability to understand (cognition).  Work and work Statistician. Screening  You may have the following tests or measurements:  Height, weight, and BMI.  Blood pressure.  Lipid and cholesterol levels. These may be checked every 5 years, or more frequently if you are over 86 years old.  Skin check.  Lung cancer screening. You may have this screening every year starting at age 31 if you have a 30-pack-year history of smoking and currently smoke or have quit within the past 15 years.  Fecal occult blood test (FOBT) of the stool. You may have this test every year starting at age 106.  Flexible sigmoidoscopy or colonoscopy. You may have a sigmoidoscopy every 5 years or a colonoscopy every 10 years starting at age 15.  Prostate cancer screening. Recommendations will vary depending on your family history and other risks.  Hepatitis C blood test.  Hepatitis B blood test.  Sexually transmitted disease (STD) testing.  Diabetes screening. This is done by checking your blood sugar (glucose) after you have not eaten for a while (fasting). You may have this done every 1-3 years.  Abdominal aortic aneurysm (AAA) screening. You may need this if you are a current or former smoker.  Osteoporosis. You may be screened starting at age 73 if you are at high risk. Talk with your health care provider about your test results, treatment options, and if necessary, the need for more tests. Vaccines  Your health care provider may recommend certain vaccines, such as:  Influenza vaccine. This is recommended every year.  Tetanus, diphtheria, and acellular pertussis (Tdap,  Td) vaccine. You may need a Td booster every 10 years.  Zoster vaccine. You may need this after age 48.  Pneumococcal 13-valent conjugate (PCV13) vaccine.  One dose is recommended after age 49.  Pneumococcal polysaccharide (PPSV23) vaccine. One dose is recommended after age 22. Talk to your health care provider about which screenings and vaccines you need and how often you need them. This information is not intended to replace advice given to you by your health care provider. Make sure you discuss any questions you have with your health care provider. Document Released: 12/02/2015 Document Revised: 07/25/2016 Document Reviewed: 09/06/2015 Elsevier Interactive Patient Education  2017 [place] Prevention in the Home Falls can cause injuries. They can happen to people of all ages. There are many things you can do to make your home safe and to help prevent falls. What can I do on the outside of my home?  Regularly fix the edges of walkways and driveways and fix any cracks.  Remove anything that might make you trip as you walk through a door, such as a raised step or threshold.  Trim any bushes or trees on the path to your home.  Use bright outdoor lighting.  Clear any walking paths of anything that might make someone trip, such as rocks or tools.  Regularly check to see if handrails are loose or broken. Make sure that both sides of any steps have handrails.  Any raised decks and porches should have guardrails on the edges.  Have any leaves, snow, or ice cleared regularly.  Use sand or salt on walking paths during winter.  Clean up any spills in your garage right away. This includes oil or grease spills. What can I do in the bathroom?  Use night lights.  Install grab bars by the toilet and in the tub and shower. Do not use towel bars as grab bars.  Use non-skid mats or decals in the tub or shower.  If you need to sit down in the shower, use a plastic, non-slip stool.  Keep the floor dry. Clean up any water that spills on the floor as soon as it happens.  Remove soap buildup in the tub or shower regularly.  Attach bath  mats securely with double-sided non-slip rug tape.  Do not have throw rugs and other things on the floor that can make you trip. What can I do in the bedroom?  Use night lights.  Make sure that you have a light by your bed that is easy to reach.  Do not use any sheets or blankets that are too big for your bed. They should not hang down onto the floor.  Have a firm chair that has side arms. You can use this for support while you get dressed.  Do not have throw rugs and other things on the floor that can make you trip. What can I do in the kitchen?  Clean up any spills right away.  Avoid walking on wet floors.  Keep items that you use a lot in easy-to-reach places.  If you need to reach something above you, use a strong step stool that has a grab bar.  Keep electrical cords out of the way.  Do not use floor polish or wax that makes floors slippery. If you must use wax, use non-skid floor wax.  Do not have throw rugs and other things on the floor that can make you trip. What can I do with my stairs?  Do not  leave any items on the stairs.  Make sure that there are handrails on both sides of the stairs and use them. Fix handrails that are broken or loose. Make sure that handrails are as long as the stairways.  Check any carpeting to make sure that it is firmly attached to the stairs. Fix any carpet that is loose or worn.  Avoid having throw rugs at the top or bottom of the stairs. If you do have throw rugs, attach them to the floor with carpet tape.  Make sure that you have a light switch at the top of the stairs and the bottom of the stairs. If you do not have them, ask someone to add them for you. What else can I do to help prevent falls?  Wear shoes that:  Do not have high heels.  Have rubber bottoms.  Are comfortable and fit you well.  Are closed at the toe. Do not wear sandals.  If you use a stepladder:  Make sure that it is fully opened. Do not climb a closed  stepladder.  Make sure that both sides of the stepladder are locked into place.  Ask someone to hold it for you, if possible.  Clearly mark and make sure that you can see:  Any grab bars or handrails.  First and last steps.  Where the edge of each step is.  Use tools that help you move around (mobility aids) if they are needed. These include:  Canes.  Walkers.  Scooters.  Crutches.  Turn on the lights when you go into a dark area. Replace any light bulbs as soon as they burn out.  Set up your furniture so you have a clear path. Avoid moving your furniture around.  If any of your floors are uneven, fix them.  If there are any pets around you, be aware of where they are.  Review your medicines with your doctor. Some medicines can make you feel dizzy. This can increase your chance of falling. Ask your doctor what other things that you can do to help prevent falls. This information is not intended to replace advice given to you by your health care provider. Make sure you discuss any questions you have with your health care provider. Document Released: 09/01/2009 Document Revised: 04/12/2016 Document Reviewed: 12/10/2014 Elsevier Interactive Patient Education  2017 [employer].

## 2021-07-19 ENCOUNTER — Ambulatory Visit: Payer: Self-pay

## 2021-07-19 NOTE — Telephone Encounter (Signed)
Reason for Disposition  Fever > 100.4 F (38.0 C)  Answer Assessment - Initial Assessment Questions 1. SYMPTOM: "What's the main symptom you're concerned about?" (e.g., frequency, incontinence)     Burning 2. ONSET: "When did the burning start?"     Last night 3. PAIN: "Is there any pain?" If Yes, ask: "How bad is it?" (Scale: 1-10; mild, moderate, severe)     No 4. CAUSE: "What do you think is causing the symptoms?"     UTI 5. OTHER SYMPTOMS: "Do you have any other symptoms?" (e.g., fever, flank pain, blood in urine, pain with urination)     Fever 6. PREGNANCY: "Is there any chance you are pregnant?" "When was your last menstrual period?"     N/a  Protocols used: Urinary Symptoms-A-AH

## 2021-07-19 NOTE — Telephone Encounter (Signed)
Pt.'s wife reports pt. Started having burning with urination yesterday. Running fever 100.5, goes down with medication. No availability in the practice. [place] in the practice advises pt. Go to UC. Wife states "he probably won't go." Asks to be called if there is a cancellation. Advised of the importance of being seen today. Please advise pt.

## 2021-07-20 NOTE — Telephone Encounter (Signed)
Called [facility] and had to leave a message advising the patient that we do not have availability to see him in office and that he will need to seek care at an urgent care or possibly [facility] walk-in. I advised that we cannot just send in medication without being seen.

## 2021-07-21 ENCOUNTER — Ambulatory Visit
Admission: RE | Admit: 2021-07-21 | Discharge: 2021-07-21 | Disposition: A | Discharge status: Home / Self Care | Payer: Medicare PPO | Source: Ambulatory Visit | Attending: Physician Assistant | Admitting: Physician Assistant

## 2021-07-21 ENCOUNTER — Other Ambulatory Visit: Payer: Self-pay

## 2021-07-21 VITALS — BP 153/65 | HR 59 | Temp 97.6°F | Resp 18 | Ht 71.0 in | Wt 225.0 lb

## 2021-07-21 DIAGNOSIS — N390 Urinary tract infection, site not specified: Secondary | ICD-10-CM

## 2021-07-21 LAB — POCT URINALYSIS DIP (DEVICE)
Bilirubin Urine: NEGATIVE
Glucose, UA: NEGATIVE mg/dL
Ketones, ur: NEGATIVE mg/dL
Nitrite: NEGATIVE
Protein, ur: 30 mg/dL — AB
Specific Gravity, Urine: 1.02 (ref 1.005–1.030)
Urobilinogen, UA: 1 mg/dL (ref 0.0–1.0)
pH: 6.5 (ref 5.0–8.0)

## 2021-07-21 MED ORDER — NITROFURANTOIN MONOHYD MACRO 100 MG PO CAPS: 100.0000 mg | ORAL_CAPSULE | Freq: Two times a day (BID) | ORAL | 0 refills | Status: DC

## 2021-07-21 NOTE — ED Triage Notes (Signed)
Pt c/o dysuria since Wednesday. Pt denies hematuria, abd pain, f/n/v/d or other symptoms.

## 2021-07-21 NOTE — ED Provider Notes (Signed)
[facility]    CSN: [id number] Arrival date & time: 07/21/21  1632      History   Chief Complaint Chief Complaint  Patient presents with   Dysuria    HPI George Jensen is a 75 y.o. male.   Patient is a 75 year old male who presents with complaint of burning with urination x2 days.  Patient reports urgency and frequency.  He denies any hematuria or penile discharge or is not urinating.  Patient denies any abdominal pain or nausea.  He does report a temp of 100.5 on Wednesday as well as some chills that day but none since.  Patient denies any drug allergies.   Past Medical History:  Diagnosis Date   BCC (basal cell carcinoma)    x 2 on face   COPD (chronic obstructive pulmonary disease) (HCC)    Hyperlipidemia    Hypertension    Melanoma ([place])    Myocardial infarction ([place]) 10/2014   SCCA (squamous cell carcinoma) of skin    SCC x 1 on face    Patient Active Problem List   Diagnosis Date Noted   Dyslipidemia 03/12/2017   CKD (chronic kidney disease), stage III ([place]) 03/12/2017   GERD (gastroesophageal reflux disease) 02/09/2016   Major depression in remission ([place]) 12/22/2015   Pre-diabetes 12/22/2015   Benign hypertension with CKD (chronic kidney disease) stage III ([place]) 07/14/2015   Pulmonary emphysema ([place]) 05/20/2015   CAD, multiple vessel 11/30/2014   History of ST elevation myocardial infarction (STEMI) 10/26/2014    Past Surgical History:  Procedure Laterality Date   CAROTID STENT  10/2014   3 STENTS [place] Medications    Prior to Admission medications   Medication Sig Start Date End Date Taking? Authorizing Provider  albuterol (PROVENTIL HFA;VENTOLIN HFA) 108 (90 BASE) MCG/ACT inhaler Inhale 2 puffs into the lungs every 4 (four) hours as needed for wheezing or shortness of breath (or shortness of breath). 07/19/15  Yes Arlis Porta., MD  aspirin 81 MG EC tablet TAKE 1 TABLET EVERY DAY  08/01/15  Yes Arlis Porta., MD  atorvastatin (LIPITOR) 80 MG tablet Take 80 mg by mouth daily at 6 PM.  04/18/15  Yes [provider]  losartan (COZAAR) 25 MG tablet Take 1 tablet (25 mg total) by mouth daily. 09/29/20  Yes Karamalegos, Devonne Doughty, DO  metoprolol tartrate (LOPRESSOR) 25 MG tablet Take 25 mg by mouth 2 (two) times daily.  04/18/15  Yes [provider]  Multiple Vitamins-Minerals (MULTIVITAMIN MEN 50+) TABS Take daily by mouth.   Yes [provider]  nitrofurantoin, macrocrystal-monohydrate, (MACROBID) 100 MG capsule Take 1 capsule (100 mg total) by mouth 2 (two) times daily. 07/21/21  Yes Luvenia Redden, PA-C  nitroGLYCERIN (NITROSTAT) 0.4 MG SL tablet Place 0.4 mg under the tongue every 5 (five) minutes as needed for chest pain. AS NEEDED   Yes [provider]  omeprazole (PRILOSEC) 40 MG capsule Take 1 capsule (40 mg total) by mouth daily. 03/31/21  Yes Karamalegos, Devonne Doughty, DO  sertraline (ZOLOFT) 50 MG tablet Take 1 tablet (50 mg total) by mouth daily. 01/19/21  Yes Karamalegos, Devonne Doughty, DO  tiotropium (SPIRIVA) 18 MCG inhalation capsule Place 18 mcg into inhaler and inhale every other day.  06/13/15 07/21/21 Yes [provider]  ezetimibe (ZETIA) 10 MG tablet Take 10 mg by mouth daily.  02/08/17 03/31/21  [provider]    Family History Family History  Problem Relation Age of Onset   Alzheimer's disease Mother    Diabetes Father    Heart disease Father     Social History Social History   Tobacco Use   Smoking status: Former    Packs/day: 2.00    Types: Cigarettes    Quit date: 10/19/2014    Years since quitting: 6.7   Smokeless tobacco: Former  Scientific laboratory technician Use: Never used  Substance Use Topics   Alcohol use: No   Drug use: No     Allergies   Patient has no known allergies.   Review of Systems Review of Systems as noted above in HPI.  Other systems reviewed and found to be  negative   Physical Exam Triage Vital Signs ED Triage Vitals  Enc Vitals Group     BP 07/21/21 1643 (!) 153/65     Pulse Rate 07/21/21 1643 (!) 59     Resp 07/21/21 1643 18     Temp 07/21/21 1643 97.6 F (36.4 C)     Temp Source 07/21/21 1643 Oral     SpO2 07/21/21 1643 98 %     Weight 07/21/21 1641 225 lb (102.1 kg)     Height 07/21/21 1641 '5\' 11"'$  (1.803 m)     Head Circumference --      Peak Flow --      Pain Score 07/21/21 1641 5     Pain Loc --      Pain Edu? --      Excl. in [place]? --    No data found.  Updated Vital Signs BP (!) 153/65 (BP Location: Left Arm)   Pulse (!) 59   Temp 97.6 F (36.4 C) (Oral)   Resp 18   Ht '5\' 11"'$  (1.803 m)   Wt 225 lb (102.1 kg)   SpO2 98%   BMI 31.38 kg/m   Physical Exam Constitutional:      General: He is not in acute distress.    Appearance: Normal appearance. He is not ill-appearing.  HENT:     Head: Normocephalic and atraumatic.  Cardiovascular:     Rate and Rhythm: Normal rate and regular rhythm.  Pulmonary:     Effort: Pulmonary effort is normal. No respiratory distress.  Abdominal:     General: Abdomen is flat.     Palpations: Abdomen is soft.     Tenderness: There is no abdominal tenderness. There is no right CVA tenderness, left CVA tenderness or guarding.  Skin:    General: Skin is warm and dry.  Neurological:     General: No focal deficit present.     Mental Status: He is alert and oriented to person, place, and time.     UC Treatments / Results  Labs (all labs ordered are listed, but only abnormal results are displayed) Labs Reviewed  POCT URINALYSIS DIP (DEVICE) - Abnormal; Notable for the following components:      Result Value   Hgb urine dipstick TRACE (*)    Protein, ur 30 (*)    Leukocytes,Ua LARGE (*)    All other components within normal limits  URINE CULTURE  POCT URINALYSIS DIPSTICK, ED / UC    EKG   Radiology No results found.  Procedures Procedures (including critical care  time)  Medications Ordered in UC Medications - No data to display  Initial Impression / Assessment and Plan / UC Course  I have reviewed the triage vital  signs and the nursing notes.  Pertinent labs & imaging results that were available during my care of the patient were reviewed by me and considered in my medical decision making (see chart for details).    Patient with frequency and urgency as well as burning with urination x2 days.  Patient reports fever chills the first day.  Point-of-care test with large leukocyte Estrace and trace hemoglobin.  We will give him prescription for Macrobid and send the urine for culture.  Advised he can use ibuprofen and Tylenol as needed for pain push fluids.  Also use over-the-counter Azo for pain as well. Final Clinical Impressions(s) / UC Diagnoses   Final diagnoses:  Urinary tract infection without hematuria, site unspecified     Discharge Instructions      -Macrobid: 1 tablet twice a day for 5 days -We will send a culture for fluid.  If change in antibiotic is needed we will contact you -Push fluids -Ibuprofen or Tylenol as needed for pain -Can also use over-the-counter Azo for bladder pain as well -Follow-up with your primary care provider this clinic as needed should symptoms worsen or not improve     ED Prescriptions     Medication Sig Dispense Auth. Provider   nitrofurantoin, macrocrystal-monohydrate, (MACROBID) 100 MG capsule Take 1 capsule (100 mg total) by mouth 2 (two) times daily. 10 capsule Luvenia Redden, PA-C      PDMP not reviewed this encounter.   Luvenia Redden, PA-C 07/21/21 1712

## 2021-07-21 NOTE — Discharge Instructions (Addendum)
-  Macrobid: 1 tablet twice a day for 5 days -We will send a culture for fluid.  If change in antibiotic is needed we will contact you -Push fluids -Ibuprofen or Tylenol as needed for pain -Can also use over-the-counter Azo for bladder pain as well -Follow-up with your primary care provider this clinic as needed should symptoms worsen or not improve

## 2021-07-23 LAB — URINE CULTURE

## 2021-08-15 ENCOUNTER — Ambulatory Visit: Payer: Self-pay | Admitting: *Deleted

## 2021-08-15 NOTE — Telephone Encounter (Signed)
Pt's wife called back. Burning with every urination. Just finished round of abx. Denies fever, back or flank pain, blood in urine. Care advice given and wife verbalized understanding. Made appt for pt tomorrow. Wife verbalized understanding of appt.      Reason for Disposition  All other males with painful urination  Answer Assessment - Initial Assessment Questions 1. SYMPTOM: "What's the main symptom you're concerned about?" (e.g., frequency, incontinence)     Last week burning 2. ONSET: "When did the  burning  start?"     Last week 3. PAIN: "Is there any pain?" If Yes, ask: "How bad is it?" (Scale: 1-10; mild, moderate, severe)     Not with home 4. CAUSE: "What do you think is causing the symptoms?"     UTI 5. OTHER SYMPTOMS: "Do you have any other symptoms?" (e.g., fever, flank pain, blood in urine, pain with urination)     Pain with urination 6. PREGNANCY: "Is there any chance you are pregnant?" "When was your last menstrual period?"     N/a  Answer Assessment - Initial Assessment Questions 1. SEVERITY: "How bad is the pain?"  (e.g., Scale 1-10; mild, moderate, or severe)   - MILD (1-3): complains slightly about urination hurting   - MODERATE (4-7): interferes with normal activities     - SEVERE (8-10): excruciating, unwilling or unable to urinate because of the pain      mild 2. FREQUENCY: "How many times have you had painful urination today?"      yes 3. PATTERN: "Is pain present every time you urinate or just sometimes?"      Every time 4. ONSET: "When did the painful urination start?"      saturday 5. FEVER: "Do you have a fever?" If Yes, ask: "What is your temperature, how was it measured, and when did it start?"     no 6. PAST UTI: "Have you had a urine infection before?" If Yes, ask: "When was the last time?" and "What happened that time?"      yes 7. CAUSE: "What do you think is causing the painful urination?"      UTI 8. OTHER SYMPTOMS: "Do you have any other  symptoms?" (e.g., flank pain, penile discharge, scrotal pain, blood in urine)     no  Protocols used: Urinary Symptoms-A-AH, Urination Pain - Male-A-AH

## 2021-08-15 NOTE — Telephone Encounter (Signed)
Summary: Clinical Advice   Patient experiencing burning sensation while urinating and states he went to an urgent care and was prescribed  nitrofurantoin mono 100mg  1 capsule by mouth twice a day and was taking azo pills. Patient completed the antibiotics and the burning sensation has stopped but has now returned intermittently seeking clinical advice. Patient PCP has no available appointments     Attempted to call patient-left message to return call

## 2021-08-16 ENCOUNTER — Ambulatory Visit: Payer: Medicare PPO | Admitting: Family Medicine

## 2021-08-16 ENCOUNTER — Other Ambulatory Visit: Payer: Self-pay

## 2021-08-16 ENCOUNTER — Encounter: Payer: Self-pay | Admitting: Family Medicine

## 2021-08-16 VITALS — BP 127/67 | HR 53 | Wt 225.0 lb

## 2021-08-16 DIAGNOSIS — N3001 Acute cystitis with hematuria: Secondary | ICD-10-CM

## 2021-08-16 DIAGNOSIS — R300 Dysuria: Secondary | ICD-10-CM | POA: Diagnosis not present

## 2021-08-16 LAB — POCT URINALYSIS DIPSTICK
Bilirubin, UA: NEGATIVE
Glucose, UA: NEGATIVE
Ketones, UA: NEGATIVE
Nitrite, UA: POSITIVE
Protein, UA: POSITIVE — AB
Spec Grav, UA: 1.015 (ref 1.010–1.025)
Urobilinogen, UA: 0.2 E.U./dL
pH, UA: 6 (ref 5.0–8.0)

## 2021-08-16 MED ORDER — CIPROFLOXACIN HCL 500 MG PO TABS: 500.0000 mg | ORAL_TABLET | Freq: Two times a day (BID) | ORAL | 0 refills | Status: DC

## 2021-08-16 NOTE — Progress Notes (Signed)
Subjective:    Patient ID: George Jensen, male    DOB: 1945-11-27, 75 y.o.   MRN: [id number]  George Jensen is a 75 y.o. male presenting on 08/16/2021 for Dysuria   HPI  Urgent Care / ED FOLLOW-UP VISIT  Hospital/Location: [facility] Date of UC  Visit: 07/21/21  Reason for Presenting to UC: Dysuria UTI Primary (+Secondary) Diagnosis: UTI  FOLLOW-UP - ED provider note and record have been reviewed - Patient presents today about 26 days after recent UC visit. Brief summary of recent course, patient had symptoms of urinary urgency, frequency and burning for a few days. He went to [facility]. They did urine dipstick and confirmed UTI and Urine Culture. No prior UTI in past. - Results later showed multiple organisms. Patient has tried to contact them back to follow up but could not get through. They prescribed him Macrobid antibiotic for UTI.  - Today reports overall has same symptoms, he said relief temporarily for 1-2 days then symptoms returned. Continues urinary frequency and dysuria.   I have reviewed the discharge medication list, and have reconciled the current and discharge medications today.   Depression screen [facility] 2/9 04/04/2021 03/31/2021 09/29/2020  Decreased Interest 0 0 0  Down, Depressed, Hopeless 0 0 0  PHQ - 2 Score 0 0 0  Altered sleeping - 0 0  Tired, decreased energy - 0 0  Change in appetite - 0 0  Feeling bad or failure about yourself  - 0 0  Trouble concentrating - 0 0  Moving slowly or fidgety/restless - 0 0  Suicidal thoughts - 0 0  PHQ-9 Score - 0 0  Difficult doing work/chores - Not difficult at all Not difficult at all  Some recent data might be hidden    Social History   Tobacco Use   Smoking status: Former    Packs/day: 2.00    Types: Cigarettes    Quit date: 10/19/2014    Years since quitting: 6.8   Smokeless tobacco: Former  Scientific laboratory technician Use: Never used  Substance Use Topics   Alcohol use: No   Drug use: No     Review of Systems Per HPI unless specifically indicated above     Objective:    BP 127/67   Pulse (!) 53   Wt 225 lb (102.1 kg)   SpO2 98%   BMI 31.38 kg/m   Wt Readings from Last 3 Encounters:  08/16/21 225 lb (102.1 kg)  07/21/21 225 lb (102.1 kg)  04/04/21 228 lb (103.4 kg)    Physical Exam Vitals and nursing note reviewed.  Constitutional:      General: He is not in acute distress.    Appearance: Normal appearance. He is well-developed. He is not diaphoretic.     Comments: Well-appearing, comfortable, cooperative  HENT:     Head: Normocephalic and atraumatic.  Eyes:     General:        Right eye: No discharge.        Left eye: No discharge.     Conjunctiva/sclera: Conjunctivae normal.  Cardiovascular:     Rate and Rhythm: Normal rate.  Pulmonary:     Effort: Pulmonary effort is normal.  Skin:    General: Skin is warm and dry.     Findings: No erythema or rash.  Neurological:     Mental Status: He is alert and oriented to person, place, and time.  Psychiatric:        Mood  and Affect: Mood normal.        Behavior: Behavior normal.        Thought Content: Thought content normal.     Comments: Well groomed, good eye contact, normal speech and thoughts      Results for orders placed or performed in visit on 08/16/21  POCT Urinalysis Dipstick  Result Value Ref Range   Color, UA Orange    Clarity, UA Cloudy    Glucose, UA Negative Negative   Bilirubin, UA Negative    Ketones, UA Negative    Spec Grav, UA 1.015 1.010 - 1.025   Blood, UA Large +++    pH, UA 6.0 5.0 - 8.0   Protein, UA Positive (A) Negative   Urobilinogen, UA 0.2 0.2 or 1.0 E.U./dL   Nitrite, UA Positive    Leukocytes, UA Large (3+) (A) Negative   Appearance     Odor        Assessment & Plan:   Problem List Items Addressed This Visit   None Visit Diagnoses     Acute cystitis with hematuria    -  Primary   Relevant Medications   ciprofloxacin (CIPRO) 500 MG tablet   Other  Relevant Orders   POCT Urinalysis Dipstick (Completed)   Urine Culture       Clinically consistent with UTI and confirmed on UA. Urgent Care 07/21/21 almost 1 month ago had UTI on UA but unconfirmed Urine Cx multiple organisms, tried macrobid temporary relief only then returned symptoms No concern for pyelo today (no systemic symptoms, neg fever, back pain, n/v).  Plan: 1.  UA today consistent with UTI 2. Ordered Urine culture 3. Cipro 500mg  BID x 7 days 4. Improve PO hydration 5. RTC if no improvement 1-2 weeks, red flags given to return sooner *Consider treating for antibiotic induced yeast infection if recurrent symptoms but urinary symptoms improved*   Meds ordered this encounter  Medications   ciprofloxacin (CIPRO) 500 MG tablet    Sig: Take 1 tablet (500 mg total) by mouth 2 (two) times daily for 7 days.    Dispense:  14 tablet    Refill:  0     Follow up plan: Return in about 1 week (around 08/23/2021), or if symptoms worsen or fail to improve, for UTI.   George Jensen, [facility] 08/16/2021, 2:07 PM

## 2021-08-16 NOTE — Patient Instructions (Addendum)
Thank you for coming to the office today.  1. You have a Urinary Tract Infection - this is very common, your symptoms are reassuring and you should get better within 1 week on the antibiotics - Start Cipro 500mg  2 times daily for next 7 days, complete entire course, even if feeling better - We sent urine for a culture, we will call you within next few days if we need to change antibiotics - Please drink plenty of fluids, improve hydration over next 1 week  If symptoms worsening, developing nausea / vomiting, worsening back pain, fevers / chills / sweats, then please return for re-evaluation sooner.  If you take AZO OTC - limit this to 2-3 days MAX to avoid affecting kidneys  D-Mannose is a natural supplement that can actually help bind to urinary bacteria and reduce their effectiveness it can help prevent UTI from forming, and may reduce some symptoms. It likely cannot cure an active UTI but it is worth a try and good to prevent them with. Try 500mg  twice a day at a full dose if you want, or check package instructions for more info  If you improve with urinary symptoms but still have inflammation or irritation or discharge - we could treat for yeast infection.   Please schedule a Follow-up Appointment to: Return in about 1 week (around 08/23/2021), or if symptoms worsen or fail to improve, for UTI.  If you have any other questions or concerns, please feel free to call the office or send a message through [place]. You may also schedule an earlier appointment if necessary.  Additionally, you may be receiving a survey about your experience at our office within a few days to 1 week by e-mail or mail. We value your feedback.  [name], DO [place]

## 2021-08-18 LAB — URINE CULTURE
MICRO NUMBER:: 12435962
Result:: NO GROWTH
SPECIMEN QUALITY:: ADEQUATE

## 2021-08-23 ENCOUNTER — Telehealth: Payer: Self-pay

## 2021-08-23 DIAGNOSIS — B379 Candidiasis, unspecified: Secondary | ICD-10-CM

## 2021-08-23 DIAGNOSIS — N3001 Acute cystitis with hematuria: Secondary | ICD-10-CM

## 2021-08-23 MED ORDER — CIPROFLOXACIN HCL 500 MG PO TABS: 500.0000 mg | ORAL_TABLET | Freq: Two times a day (BID) | ORAL | 0 refills | Status: DC

## 2021-08-23 MED ORDER — FLUCONAZOLE 150 MG PO TABS: ORAL_TABLET | ORAL | 0 refills | Status: DC

## 2021-08-23 NOTE — Telephone Encounter (Signed)
Copied from [place] [id number]. Topic: General - Other >> Aug 23, 2021 11:07 AM Jodie Echevaria wrote: Reason for CRM: Patient wife Blanch Media called in to inform Dr Raliegh Ip that patient took all his antibiotics as of 08/22/21 and he is still burning a little bit. Needing to know if there is anything they need to do or does he need more antibiotics. Please call with an answer to Ph#  [phone]

## 2021-08-23 NOTE — Telephone Encounter (Signed)
Called patient. I will send in 3 more days of Cipro antibiotic take one pill twice a day for 3 days.  Also sent in Diflucan for possible yeast infection that can cause burning. He should take this AFTER finishes Cipro.  Would consider Urologist as next option if still problem.  [name], [facility] 08/23/2021, 4:57 PM

## 2021-08-25 DIAGNOSIS — E785 Hyperlipidemia, unspecified: Secondary | ICD-10-CM | POA: Diagnosis not present

## 2021-08-25 DIAGNOSIS — Z23 Encounter for immunization: Secondary | ICD-10-CM | POA: Diagnosis not present

## 2021-08-25 DIAGNOSIS — J432 Centrilobular emphysema: Secondary | ICD-10-CM | POA: Diagnosis not present

## 2021-08-25 DIAGNOSIS — I252 Old myocardial infarction: Secondary | ICD-10-CM | POA: Diagnosis not present

## 2021-08-25 DIAGNOSIS — I2510 Atherosclerotic heart disease of native coronary artery without angina pectoris: Secondary | ICD-10-CM | POA: Diagnosis not present

## 2021-08-25 DIAGNOSIS — I129 Hypertensive chronic kidney disease with stage 1 through stage 4 chronic kidney disease, or unspecified chronic kidney disease: Secondary | ICD-10-CM | POA: Diagnosis not present

## 2021-08-25 DIAGNOSIS — I2111 ST elevation (STEMI) myocardial infarction involving right coronary artery: Secondary | ICD-10-CM | POA: Diagnosis not present

## 2021-08-25 DIAGNOSIS — N183 Chronic kidney disease, stage 3 unspecified: Secondary | ICD-10-CM | POA: Diagnosis not present

## 2021-09-01 DIAGNOSIS — J439 Emphysema, unspecified: Secondary | ICD-10-CM | POA: Diagnosis not present

## 2021-09-19 ENCOUNTER — Other Ambulatory Visit: Payer: Self-pay | Admitting: Family Medicine

## 2021-09-19 DIAGNOSIS — I2510 Atherosclerotic heart disease of native coronary artery without angina pectoris: Secondary | ICD-10-CM

## 2021-09-19 DIAGNOSIS — N183 Chronic kidney disease, stage 3 unspecified: Secondary | ICD-10-CM

## 2021-09-19 DIAGNOSIS — I129 Hypertensive chronic kidney disease with stage 1 through stage 4 chronic kidney disease, or unspecified chronic kidney disease: Secondary | ICD-10-CM

## 2021-09-19 NOTE — Telephone Encounter (Signed)
Requested medication (s) are due for refill today: due 09/29/21  Requested medication (s) are on the active medication list: yes  Last refill: 09/29/20  #90  3 refills  Future visit scheduled yes 10/06/21  Notes to clinic:  Failed due to labs, please review.  Requested Prescriptions  Pending Prescriptions Disp Refills   losartan (COZAAR) 25 MG tablet [Pharmacy Med Name: LOSARTAN POTASSIUM 25 MG Tablet] 90 tablet 3    Sig: TAKE 1 TABLET EVERY DAY     Cardiovascular:  Angiotensin Receptor Blockers Failed - 09/19/2021  3:12 PM      Failed - Cr in normal range and within 180 days    Creat  Date Value Ref Range Status  09/22/2020 1.58 (H) 0.70 - 1.18 mg/dL Final    Comment:    For patients >42 years of age, the reference limit for Creatinine is approximately 13% higher for people identified as African-American. .           Failed - K in normal range and within 180 days    Potassium  Date Value Ref Range Status  09/22/2020 4.6 3.5 - 5.3 mmol/L Final          Passed - Patient is not pregnant      Passed - Last BP in normal range    BP Readings from Last 1 Encounters:  08/16/21 127/67          Passed - Valid encounter within last 6 months    Recent Outpatient Visits           1 month ago Acute cystitis with hematuria   [place], DO   5 months ago Pre-diabetes   [facility] [place] Olin Hauser, DO   11 months ago Annual physical exam   [place], DO   1 year ago Chronic idiopathic constipation   [place], Devonne Doughty, DO   1 year ago [facility] [place], Devonne Doughty, DO       Future Appointments             In 2 weeks [place], Devonne Doughty, [facility], [place]   In 6 months  [facility], [place]

## 2021-09-29 ENCOUNTER — Other Ambulatory Visit: Payer: Medicare PPO

## 2021-09-29 ENCOUNTER — Other Ambulatory Visit: Payer: Self-pay

## 2021-09-29 DIAGNOSIS — R351 Nocturia: Secondary | ICD-10-CM | POA: Diagnosis not present

## 2021-09-29 DIAGNOSIS — Z0000 Encounter for general adult medical examination without abnormal findings: Secondary | ICD-10-CM

## 2021-09-29 DIAGNOSIS — N183 Chronic kidney disease, stage 3 unspecified: Secondary | ICD-10-CM | POA: Diagnosis not present

## 2021-09-29 DIAGNOSIS — E785 Hyperlipidemia, unspecified: Secondary | ICD-10-CM

## 2021-09-29 DIAGNOSIS — N1831 Chronic kidney disease, stage 3a: Secondary | ICD-10-CM | POA: Diagnosis not present

## 2021-09-29 DIAGNOSIS — J431 Panlobular emphysema: Secondary | ICD-10-CM | POA: Diagnosis not present

## 2021-09-29 DIAGNOSIS — R7303 Prediabetes: Secondary | ICD-10-CM

## 2021-09-29 DIAGNOSIS — I129 Hypertensive chronic kidney disease with stage 1 through stage 4 chronic kidney disease, or unspecified chronic kidney disease: Secondary | ICD-10-CM | POA: Diagnosis not present

## 2021-09-30 LAB — COMPLETE METABOLIC PANEL WITH GFR
AG Ratio: 1.8 (calc) (ref 1.0–2.5)
ALT: 16 U/L (ref 9–46)
AST: 20 U/L (ref 10–35)
Albumin: 3.9 g/dL (ref 3.6–5.1)
Alkaline phosphatase (APISO): 73 U/L (ref 35–144)
BUN/Creatinine Ratio: 12 (calc) (ref 6–22)
BUN: 18 mg/dL (ref 7–25)
CO2: 23 mmol/L (ref 20–32)
Calcium: 8.8 mg/dL (ref 8.6–10.3)
Chloride: 109 mmol/L (ref 98–110)
Creat: 1.53 mg/dL — ABNORMAL HIGH (ref 0.70–1.28)
Globulin: 2.2 g/dL (calc) (ref 1.9–3.7)
Glucose, Bld: 102 mg/dL — ABNORMAL HIGH (ref 65–99)
Potassium: 5 mmol/L (ref 3.5–5.3)
Sodium: 142 mmol/L (ref 135–146)
Total Bilirubin: 0.5 mg/dL (ref 0.2–1.2)
Total Protein: 6.1 g/dL (ref 6.1–8.1)
eGFR: 48 mL/min/{1.73_m2} — ABNORMAL LOW (ref >=60)

## 2021-09-30 LAB — HEMOGLOBIN A1C
Hgb A1c MFr Bld: 6.2 % of total Hgb — ABNORMAL HIGH (ref <5.7)
Mean Plasma Glucose: 131 mg/dL
eAG (mmol/L): 7.3 mmol/L

## 2021-09-30 LAB — CBC WITH DIFFERENTIAL/PLATELET
Absolute Monocytes: 730 cells/uL (ref 200–950)
Basophils Absolute: 37 cells/uL (ref 0–200)
Basophils Relative: 0.5 %
Eosinophils Absolute: 942 cells/uL — ABNORMAL HIGH (ref 15–500)
Eosinophils Relative: 12.9 %
HCT: 39.8 % (ref 38.5–50.0)
Hemoglobin: 12.8 g/dL — ABNORMAL LOW (ref 13.2–17.1)
Lymphs Abs: 1467 cells/uL (ref 850–3900)
MCH: 30 pg (ref 27.0–33.0)
MCHC: 32.2 g/dL (ref 32.0–36.0)
MCV: 93.2 fL (ref 80.0–100.0)
MPV: 10.3 fL (ref 7.5–12.5)
Monocytes Relative: 10 %
Neutro Abs: 4125 cells/uL (ref 1500–7800)
Neutrophils Relative %: 56.5 %
Platelets: 159 10*3/uL (ref 140–400)
RBC: 4.27 10*6/uL (ref 4.20–5.80)
RDW: 12.6 % (ref 11.0–15.0)
Total Lymphocyte: 20.1 %
WBC: 7.3 10*3/uL (ref 3.8–10.8)

## 2021-09-30 LAB — TSH: TSH: 1.04 mIU/L (ref 0.40–4.50)

## 2021-09-30 LAB — LIPID PANEL
Cholesterol: 129 mg/dL (ref <200)
HDL: 39 mg/dL — ABNORMAL LOW (ref >=40)
LDL Cholesterol (Calc): 72 mg/dL (calc)
Non-HDL Cholesterol (Calc): 90 mg/dL (calc) (ref <130)
Total CHOL/HDL Ratio: 3.3 (calc) (ref <5.0)
Triglycerides: 97 mg/dL (ref <150)

## 2021-09-30 LAB — PSA: PSA: 0.98 ng/mL (ref <=4.00)

## 2021-10-06 ENCOUNTER — Encounter: Payer: Self-pay | Admitting: Family Medicine

## 2021-10-06 ENCOUNTER — Ambulatory Visit (INDEPENDENT_AMBULATORY_CARE_PROVIDER_SITE_OTHER): Payer: Medicare PPO | Admitting: Family Medicine

## 2021-10-06 ENCOUNTER — Other Ambulatory Visit: Payer: Self-pay

## 2021-10-06 ENCOUNTER — Other Ambulatory Visit: Payer: Self-pay | Admitting: Family Medicine

## 2021-10-06 VITALS — BP 108/65 | HR 76 | Ht 71.0 in | Wt 229.2 lb

## 2021-10-06 DIAGNOSIS — I2510 Atherosclerotic heart disease of native coronary artery without angina pectoris: Secondary | ICD-10-CM

## 2021-10-06 DIAGNOSIS — E785 Hyperlipidemia, unspecified: Secondary | ICD-10-CM

## 2021-10-06 DIAGNOSIS — R351 Nocturia: Secondary | ICD-10-CM

## 2021-10-06 DIAGNOSIS — J431 Panlobular emphysema: Secondary | ICD-10-CM

## 2021-10-06 DIAGNOSIS — R7303 Prediabetes: Secondary | ICD-10-CM | POA: Diagnosis not present

## 2021-10-06 DIAGNOSIS — N183 Chronic kidney disease, stage 3 unspecified: Secondary | ICD-10-CM

## 2021-10-06 DIAGNOSIS — Z0000 Encounter for general adult medical examination without abnormal findings: Secondary | ICD-10-CM

## 2021-10-06 DIAGNOSIS — I129 Hypertensive chronic kidney disease with stage 1 through stage 4 chronic kidney disease, or unspecified chronic kidney disease: Secondary | ICD-10-CM

## 2021-10-06 DIAGNOSIS — Z23 Encounter for immunization: Secondary | ICD-10-CM

## 2021-10-06 DIAGNOSIS — F325 Major depressive disorder, single episode, in full remission: Secondary | ICD-10-CM

## 2021-10-06 MED ORDER — METOPROLOL TARTRATE 25 MG PO TABS: 25.0000 mg | ORAL_TABLET | Freq: Two times a day (BID) | ORAL | 3 refills | Status: DC

## 2021-10-06 MED ORDER — SERTRALINE HCL 50 MG PO TABS: 50.0000 mg | ORAL_TABLET | Freq: Every day | ORAL | 3 refills | Status: DC

## 2021-10-06 MED ORDER — ATORVASTATIN CALCIUM 80 MG PO TABS: 80.0000 mg | ORAL_TABLET | Freq: Every day | ORAL | 3 refills | Status: DC

## 2021-10-06 MED ORDER — EZETIMIBE 10 MG PO TABS: 10.0000 mg | ORAL_TABLET | Freq: Every day | ORAL | 3 refills | Status: DC

## 2021-10-06 NOTE — Assessment & Plan Note (Signed)
Stable, without flare Followed by Dr Raul Del [facility] Pulm Continues on Spiriva Albuterol PRN Remains smoke free

## 2021-10-06 NOTE — Assessment & Plan Note (Signed)
Stable, medically managed, s/p RCA STEMI and PCI Followed by [facility] On ASA, high intensity statin, ARB, BB With LDL goal < 70 

## 2021-10-06 NOTE — Assessment & Plan Note (Signed)
Improved PreDM A1c 6.2 Continue lifestyle improvement

## 2021-10-06 NOTE — Assessment & Plan Note (Signed)
Controlled in remission Continues SSRI Sertraline still Consider other med change if needed in future 

## 2021-10-06 NOTE — Patient Instructions (Addendum)
Thank you for coming to the office today.  Refilled meds  Flu Shot today  COVID mega booster when ready  Excellent lab result.  DUE for FASTING BLOOD WORK (no food or drink after midnight before the lab appointment, only water or coffee without cream/sugar on the morning of)  SCHEDULE "Lab Only" visit in the morning at the clinic for lab draw in 1 YEAR  - Make sure Lab Only appointment is at about 1 week before your next appointment, so that results will be available  For Lab Results, once available within 2-3 days of blood draw, you can can log in to MyChart online to view your results and a brief explanation. Also, we can discuss results at next follow-up visit.   Please schedule a Follow-up Appointment to: Return in about 1 year (around 10/06/2022) for 1 year fasting lab only then 1 week later Annual Physical.  If you have any other questions or concerns, please feel free to call the office or send a message through [place]. You may also schedule an earlier appointment if necessary.  Additionally, you may be receiving a survey about your experience at our office within a few days to 1 week by e-mail or mail. We value your feedback.  [name], DO [place]

## 2021-10-06 NOTE — Assessment & Plan Note (Signed)
Improved, controlled lipid panel

## 2021-10-06 NOTE — Progress Notes (Signed)
Subjective:    Patient ID: George Jensen, male    DOB: October 04, 1946, 75 y.o.   MRN: [id number]  George Jensen is a 75 y.o. male presenting on 10/06/2021 for Annual Exam   HPI  Here for Annual Physical and Lab Review.  Pre-Diabetes: Results A1c 6.2 improved from 1 yr ago. CBGs: Does not check CBG Meds: No medication (never on med) Currently on ARB Lifestyle: - Diet (states not adhering to low carb low sugar lifestyle) - Exercise (working outdoors, when weather nice, but not always exercising with walking) - Taking ASA 53m, Statin Denies hypoglycemia, polyuria, visual changes, numbness or tingling.   CHRONIC HTN / CKD-III Hyperkalemia resolved Known chronic CKD-III - improved Cr and GFR on last lab. Reports no concerns. Home BP readings are normal range Current Meds - Metoprolol 259mBID, Losartan 2548meports good compliance, took meds today. Tolerating well, w/o complaints. - Tries to stay well hydrated with water. Admits had eaten many bananas lately Denies CP, dyspnea, HA, edema, dizziness / lightheadedness    Panlobular Emphysema COPD, Former Smoker Followed by Dr FleRaul Delood report recently with doing very well on PFTs On Spiriva - Rarely using Albuterol PRN  HYPERLIPIDEMIA: - Reports no concerns. Last lipid panel 09/2021, controlled lipids on medicines - Currently taking Zetia and Atorvastatin, tolerating well without side effects or myalgias    Major Depression in Remission No new concerns, see PHQ On Sertraline 59m66mily 59mg65mly.   Health Maintenance: Due for Flu Shot, will receive today   Next COVID19 booster due.  Cologuard received but declined.  PSA negative   Depression screen [facility]11/18/2022 10/06/2021 04/04/2021  Decreased Interest 0 0 0  Down, Depressed, Hopeless 0 0 0  PHQ - 2 Score 0 0 0  Altered sleeping 0 - -  Tired, decreased energy 0 - -  Change in appetite 0 - -  Feeling bad or failure about yourself  0 - -  Trouble  concentrating 0 - -  Moving slowly or fidgety/restless 0 - -  Suicidal thoughts 0 - -  PHQ-9 Score 0 - -  Difficult doing work/chores Not difficult at all - -  Some recent data might be hidden    Past Medical History:  Diagnosis Date   BCC (basal cell carcinoma)    x 2 on face   COPD (chronic obstructive pulmonary disease) (HCC)    Hyperlipidemia    Hypertension    [place]   SCCA (squamous cell carcinoma) of skin    SCC x 1 on face   Past Surgical History:  Procedure Laterality Date   CAROTID STENT  10/2014   3 STENTS [facility]   MELANOMA EXCISION  1982   Social History   Socioeconomic History   Marital status: Married    Spouse name: JoyceTaras Raskmber of children: 4   Years of education: Not on file   Highest education level: 9th grade  Occupational History   Occupation: Heavy MachiRadio produceracco Use   Smoking status: Former    Packs/day: 2.00    Types: Cigarettes    Quit date: 10/19/2014    Years since quitting: 6.9   Smokeless tobacco: Former  VapinScientific laboratory technician Never used  Substance and Sexual Activity   Alcohol use: No   Drug use: No   Sexual activity: Not on file  Other Topics Concern   Not on file  Social History Narrative   Not on file   Social Determinants of Health   Financial Resource Strain: Low Risk    Difficulty of Paying Living Expenses: Not hard at all  Food Insecurity: No Food Insecurity   Worried About Charity fundraiser in the Last Year: Never true   Arboriculturist in the Last Year: Never true  Transportation Needs: No Transportation Needs   Lack of Transportation (Medical): No   Lack of Transportation (Non-Medical): No  Physical Activity: Insufficiently Active   Days of Exercise per Week: 6 days   Minutes of Exercise per Session: 20 min  Stress: No Stress Concern Present   Feeling of Stress : Not at all  Social Connections: Not on file  Intimate Partner Violence:  Not on file   Family History  Problem Relation Age of Onset   Alzheimer's disease Mother    Diabetes Father    Heart disease Father    Current Outpatient Medications on File Prior to Visit  Medication Sig   albuterol (PROVENTIL HFA;VENTOLIN HFA) 108 (90 BASE) MCG/ACT inhaler Inhale 2 puffs into the lungs every 4 (four) hours as needed for wheezing or shortness of breath (or shortness of breath).   aspirin 81 MG EC tablet TAKE 1 TABLET EVERY DAY   losartan (COZAAR) 25 MG tablet TAKE 1 TABLET EVERY DAY   Multiple Vitamins-Minerals (MULTIVITAMIN MEN 50+) TABS Take daily by mouth.   nitroGLYCERIN (NITROSTAT) 0.4 MG SL tablet Place 0.4 mg under the tongue every 5 (five) minutes as needed for chest pain. AS NEEDED   omeprazole (PRILOSEC) 40 MG capsule Take 1 capsule (40 mg total) by mouth daily.   tiotropium (SPIRIVA) 18 MCG inhalation capsule Place 18 mcg into inhaler and inhale every other day.    No current facility-administered medications on file prior to visit.    Review of Systems  Constitutional:  Negative for activity change, appetite change, chills, diaphoresis, fatigue and fever.  HENT:  Negative for congestion and hearing loss.   Eyes:  Negative for visual disturbance.  Respiratory:  Negative for cough, chest tightness, shortness of breath and wheezing.   Cardiovascular:  Negative for chest pain, palpitations and leg swelling.  Gastrointestinal:  Negative for abdominal pain, constipation, diarrhea, nausea and vomiting.  Genitourinary:  Negative for dysuria, frequency and hematuria.  Musculoskeletal:  Negative for arthralgias and neck pain.  Skin:  Negative for rash.  Neurological:  Negative for dizziness, weakness, light-headedness, numbness and headaches.  Hematological:  Negative for adenopathy.  Psychiatric/Behavioral:  Negative for behavioral problems, dysphoric mood and sleep disturbance.   Per HPI unless specifically indicated above      Objective:    BP 108/65    Pulse 76   Ht _0  (1.803 m)   Wt 229 lb 3.2 oz (104 kg)   SpO2 98%   BMI 31.97 kg/m   Wt Readings from Last 3 Encounters:  10/06/21 229 lb 3.2 oz (104 kg)  08/16/21 225 lb (102.1 kg)  07/21/21 225 lb (102.1 kg)    Physical Exam Vitals and nursing note reviewed.  Constitutional:      General: He is not in acute distress.    Appearance: He is well-developed. He is not diaphoretic.     Comments: Well-appearing, comfortable, cooperative  HENT:     Head: Normocephalic and atraumatic.  Eyes:     General:        Right eye: No discharge.        Left  eye: No discharge.     Conjunctiva/sclera: Conjunctivae normal.     Pupils: Pupils are equal, round, and reactive to light.  Neck:     Thyroid: No thyromegaly.     Vascular: No carotid bruit.  Cardiovascular:     Rate and Rhythm: Normal rate and regular rhythm.     Pulses: Normal pulses.     Heart sounds: Normal heart sounds. No murmur heard. Pulmonary:     Effort: Pulmonary effort is normal. No respiratory distress.     Breath sounds: Normal breath sounds. No wheezing or rales.  Abdominal:     General: Bowel sounds are normal. There is no distension.     Palpations: Abdomen is soft. There is no mass.     Tenderness: There is no abdominal tenderness.  Musculoskeletal:        General: No tenderness. Normal range of motion.     Cervical back: Normal range of motion and neck supple.     Right lower leg: No edema.     Left lower leg: No edema.     Comments: Upper / Lower Extremities: - Normal muscle tone, strength bilateral upper extremities 5/5, lower extremities 5/5  Lymphadenopathy:     Cervical: No cervical adenopathy.  Skin:    General: Skin is warm and dry.     Findings: No erythema or rash.  Neurological:     Mental Status: He is alert and oriented to person, place, and time.     Comments: Distal sensation intact to light touch all extremities  Psychiatric:        Mood and Affect: Mood normal.        Behavior:  Behavior normal.        Thought Content: Thought content normal.     Comments: Well groomed, good eye contact, normal speech and thoughts    Results for orders placed or performed in visit on 09/29/21  TSH  Result Value Ref Range   TSH 1.04 0.40 - 4.50 mIU/L  PSA  Result Value Ref Range   PSA 0.98 < OR = 4.00 ng/mL  Lipid panel  Result Value Ref Range   Cholesterol 129 <200 mg/dL   HDL 39 (L) > OR = 40 mg/dL   Triglycerides 97 <150 mg/dL   LDL Cholesterol (Calc) 72 mg/dL (calc)   Total CHOL/HDL Ratio 3.3 <5.0 (calc)   Non-HDL Cholesterol (Calc) 90 <130 mg/dL (calc)  COMPLETE METABOLIC PANEL WITH GFR  Result Value Ref Range   Glucose, Bld 102 (H) 65 - 99 mg/dL   BUN 18 7 - 25 mg/dL   Creat 1.53 (H) 0.70 - 1.28 mg/dL   eGFR 48 (L) > OR = 60 mL/min/1.63m   BUN/Creatinine Ratio 12 6 - 22 (calc)   Sodium 142 135 - 146 mmol/L   Potassium 5.0 3.5 - 5.3 mmol/L   Chloride 109 98 - 110 mmol/L   CO2 23 20 - 32 mmol/L   Calcium 8.8 8.6 - 10.3 mg/dL   Total Protein 6.1 6.1 - 8.1 g/dL   Albumin 3.9 3.6 - 5.1 g/dL   Globulin 2.2 1.9 - 3.7 g/dL (calc)   AG Ratio 1.8 1.0 - 2.5 (calc)   Total Bilirubin 0.5 0.2 - 1.2 mg/dL   Alkaline phosphatase (APISO) 73 35 - 144 U/L   AST 20 10 - 35 U/L   ALT 16 9 - 46 U/L  CBC with Differential/Platelet  Result Value Ref Range   WBC 7.3 3.8 - 10.8 Thousand/uL  RBC 4.27 4.20 - 5.80 Million/uL   Hemoglobin 12.8 (L) 13.2 - 17.1 g/dL   HCT 39.8 38.5 - 50.0 %   MCV 93.2 80.0 - 100.0 fL   MCH 30.0 27.0 - 33.0 pg   MCHC 32.2 32.0 - 36.0 g/dL   RDW 12.6 11.0 - 15.0 %   Platelets 159 140 - 400 Thousand/uL   MPV 10.3 7.5 - 12.5 fL   Neutro Abs 4,125 1,500 - 7,800 cells/uL   Lymphs Abs 1,467 850 - 3,900 cells/uL   Absolute Monocytes 730 200 - 950 cells/uL   Eosinophils Absolute 942 (H) 15 - 500 cells/uL   Basophils Absolute 37 0 - 200 cells/uL   Neutrophils Relative % 56.5 %   Total Lymphocyte 20.1 %   Monocytes Relative 10.0 %   Eosinophils  Relative 12.9 %   Basophils Relative 0.5 %  Hemoglobin A1c  Result Value Ref Range   Hgb A1c MFr Bld 6.2 (H) <5.7 % of total Hgb   Mean Plasma Glucose 131 mg/dL   eAG (mmol/L) 7.3 mmol/L      Assessment & Plan:   Problem List Items Addressed This Visit     Pulmonary emphysema (HCC)    Stable, without flare Followed by Dr Raul [facility] Pulm Continues on Spiriva Albuterol PRN Remains smoke free      Pre-diabetes    Improved PreDM A1c 6.2 Continue lifestyle improvement      Major depression in remission ([place])    Controlled in remission Continues SSRI Sertraline still Consider other med change if needed in future      Relevant Medications   sertraline (ZOLOFT) 50 MG tablet   Dyslipidemia    Improved, controlled lipid panel      Relevant Medications   atorvastatin (LIPITOR) 80 MG tablet   ezetimibe (ZETIA) 10 MG tablet   CAD, multiple vessel    Stable, medically managed, s/p RCA STEMI and PCI Followed by [facility] On ASA, high intensity statin, ARB, BB With LDL goal < 70      Relevant Medications   atorvastatin (LIPITOR) 80 MG tablet   metoprolol tartrate (LOPRESSOR) 25 MG tablet   ezetimibe (ZETIA) 10 MG tablet   Benign hypertension with CKD (chronic kidney disease) stage III (HCC)    Controlled HTN - Home BP readings normal Complication with CKD-III   Plan:  1. Continue current BP regimen - Metoprolol 66m BID, Losartan 266mdaily - continue ARB with CKD 2. Encourage improved lifestyle - low sodium diet, regular exercise 3. Continue monitor BP outside office, bring readings to next visit, if persistently >140/90 or new symptoms notify office sooner      Relevant Medications   atorvastatin (LIPITOR) 80 MG tablet   metoprolol tartrate (LOPRESSOR) 25 MG tablet   ezetimibe (ZETIA) 10 MG tablet   Other Visit Diagnoses     Annual physical exam    -  Primary   Needs flu shot       Relevant Orders   Flu Vaccine QUAD High Dose(Fluad) (Completed)    Nocturia           Updated Health Maintenance information Flu Shot Reviewed recent lab results with patient Encouraged improvement to lifestyle with diet and exercise Goal of weight loss   Meds ordered this encounter  Medications   atorvastatin (LIPITOR) 80 MG tablet    Sig: Take 1 tablet (80 mg total) by mouth at bedtime.    Dispense:  90 tablet    Refill:  3  sertraline (ZOLOFT) 50 MG tablet    Sig: Take 1 tablet (50 mg total) by mouth daily.    Dispense:  90 tablet    Refill:  3   metoprolol tartrate (LOPRESSOR) 25 MG tablet    Sig: Take 1 tablet (25 mg total) by mouth 2 (two) times daily.    Dispense:  180 tablet    Refill:  3   ezetimibe (ZETIA) 10 MG tablet    Sig: Take 1 tablet (10 mg total) by mouth daily.    Dispense:  90 tablet    Refill:  3      Follow up plan: Return in about 1 year (around 10/06/2022) for 1 year fasting lab only then 1 week later Annual Physical.  Future labs ordered.  [name], [facility] 10/06/2021, 8:51 AM

## 2021-10-06 NOTE — Assessment & Plan Note (Addendum)
Controlled HTN - Home BP readings normal Complication with CKD-III   Plan:  1. Continue current BP regimen - Metoprolol 25mg BID, Losartan 25mg daily - continue ARB with CKD 2. Encourage improved lifestyle - low sodium diet, regular exercise 3. Continue monitor BP outside office, bring readings to next visit, if persistently >140/90 or new symptoms notify office sooner 

## 2021-10-27 ENCOUNTER — Ambulatory Visit: Payer: Self-pay | Admitting: *Deleted

## 2021-10-27 NOTE — Telephone Encounter (Signed)
We have already double booked Dr. Raliegh Ip for today so the only option is UC or he can wait until next week.

## 2021-10-27 NOTE — Telephone Encounter (Signed)
Reason for Disposition  All other males with painful urination  Answer Assessment - Initial Assessment Questions 1. SEVERITY: "How bad is the pain?"  (e.g., Scale 1-10; mild, moderate, or severe)   - MILD (1-3): complains slightly about urination hurting   - MODERATE (4-7): interferes with normal activities     - SEVERE (8-10): excruciating, unwilling or unable to urinate because of the pain      Mild not every time urination 2. FREQUENCY: "How many times have you had painful urination today?"      Not every time  3. PATTERN: "Is pain present every time you urinate or just sometimes?"     No  4. ONSET: "When did the painful urination start?"      Tuesday  5. FEVER: "Do you have a fever?" If Yes, ask: "What is your temperature, how was it measured, and when did it start?"     Last night 99.2 none today 98.7 6. PAST UTI: "Have you had a urine infection before?" If Yes, ask: "When was the last time?" and "What happened that time?"      Yes . Possibly September  7. CAUSE: "What do you think is causing the painful urination?"      Drinking prune juice  8. OTHER SYMPTOMS: "Do you have any other symptoms?" (e.g., flank pain, penile discharge, scrotal pain, blood in urine)     No  Protocols used: Urination Pain - Male-A-AH

## 2021-10-27 NOTE — Telephone Encounter (Signed)
Patent wife called in stated that he is having burning when he urinates and say that back in September he thinks its the Mirilax or another medication say that he never had UTI's until he started taking these medications. Needing some advise and medication. Please call Ph#  [phone]   Called patient and left message on [phone]. Called [phone] and patient's wife answered. Wife not on DPR and not with patient now, but gave information regarding c/o burning during urination. Wife reports sx started Tuesday 10/24/21. Does not happen with every void, mild pain, hx UTI, temp last night 99.2 today 98.7. patient at work now until 6 pm. Denies fever, back or flank pain, no blood in urine or discoloration reported. No appt until 10/31/21 not scheduled. Recommended E visit or UC . Patient's wife requesting a call back . Care advise given. Patient's wife verbalized understanding of care advise and to call back or go to [facility] or ED if symptoms .  Attempted call to [facility] no answer.

## 2021-12-25 ENCOUNTER — Ambulatory Visit: Payer: Self-pay

## 2021-12-25 NOTE — Telephone Encounter (Signed)
°  Chief Complaint: dysuria Symptoms: dysuria Frequency: yesterday Pertinent Negatives: NA Disposition: [] ED /[] Urgent Care (no appt availability in office) / [x] Appointment(In office/virtual)/ []  [facility]/ [] Home Care/ [] Refused Recommended Disposition /[] [facility]/ []  Follow-up with PCP Additional Notes:    Summary: advice - burning during urination   Pt was requesting antibiotics due to having burning during urination, pt stated it started yesterday and that he has had this previously. Pt needed advice     Reason for Disposition  All other males with painful urination  Answer Assessment - Initial Assessment Questions 1. SYMPTOM: "What's the main symptom you're concerned about?" (e.g., frequency, incontinence)     Burning with urination 2. ONSET: "When did the  sx  start?"     yesterday 3. PAIN: "Is there any pain?" If Yes, ask: "How bad is it?" (Scale: 1-10; mild, moderate, severe)     no 4. CAUSE: "What do you think is causing the symptoms?"     UTI 5. OTHER SYMPTOMS: "Do you have any other symptoms?" (e.g., fever, flank pain, blood in urine, pain with urination)     Pain with urination  Protocols used: Urinary Symptoms-A-AH, Urination Pain - Male-A-AH

## 2021-12-26 ENCOUNTER — Other Ambulatory Visit: Payer: Self-pay

## 2021-12-26 ENCOUNTER — Ambulatory Visit: Payer: Medicare PPO | Admitting: Family Medicine

## 2021-12-26 ENCOUNTER — Encounter: Payer: Self-pay | Admitting: Family Medicine

## 2021-12-26 VITALS — BP 138/64 | HR 58 | Ht 71.0 in | Wt 229.0 lb

## 2021-12-26 DIAGNOSIS — B379 Candidiasis, unspecified: Secondary | ICD-10-CM | POA: Diagnosis not present

## 2021-12-26 DIAGNOSIS — N138 Other obstructive and reflux uropathy: Secondary | ICD-10-CM

## 2021-12-26 DIAGNOSIS — N3001 Acute cystitis with hematuria: Secondary | ICD-10-CM

## 2021-12-26 DIAGNOSIS — R350 Frequency of micturition: Secondary | ICD-10-CM | POA: Diagnosis not present

## 2021-12-26 DIAGNOSIS — N401 Enlarged prostate with lower urinary tract symptoms: Secondary | ICD-10-CM

## 2021-12-26 LAB — POCT URINALYSIS DIPSTICK
Bilirubin, UA: NEGATIVE
Glucose, UA: NEGATIVE
Ketones, UA: NEGATIVE
Nitrite, UA: NEGATIVE
Protein, UA: POSITIVE — AB
Spec Grav, UA: 1.015 (ref 1.010–1.025)
Urobilinogen, UA: 0.2 E.U./dL
pH, UA: 6 (ref 5.0–8.0)

## 2021-12-26 MED ORDER — TAMSULOSIN HCL 0.4 MG PO CAPS: 0.4000 mg | ORAL_CAPSULE | Freq: Every day | ORAL | 3 refills | Status: DC

## 2021-12-26 MED ORDER — CIPROFLOXACIN HCL 500 MG PO TABS: 500.0000 mg | ORAL_TABLET | Freq: Two times a day (BID) | ORAL | 0 refills | Status: DC

## 2021-12-26 MED ORDER — FLUCONAZOLE 150 MG PO TABS: ORAL_TABLET | ORAL | 0 refills | Status: DC

## 2021-12-26 NOTE — Progress Notes (Signed)
Subjective:    Patient ID: George Jensen, male    DOB: 1946/08/06, 76 y.o.   MRN: [id number]  George Jensen is a 76 y.o. male presenting on 12/26/2021 for Dysuria   HPI  UTI Dysuria, Frequency BPH LUTS  Reports symptoms started 2 days ago with urinary frequency and dysuria, similar to prior UTI back in 07/2021 he had UTI urgent care and office visit at that time. Treated with macrobid and then cipro and it resolved at that time after cipro He admits even when feeling good has urinary frequency  Denies any fevers chills nausea vomiting flank pain.  AUA BPH Symptom Score over past 1 month 1. Sensation of not emptying bladder post void - 4 2. Urinate less than 2 hour after finish last void - 5 3. Start/Stop several times during void - 4 4. Difficult to postpone urination - 5 5. Weak urinary stream - 2 6. Push or strain urination - 2 7. Nocturia - 4 times  Total Score: 26 (Moderate BPH symptoms)   Depression screen [facility] 2/9 10/06/2021 10/06/2021 04/04/2021  Decreased Interest 0 0 0  Down, Depressed, Hopeless 0 0 0  PHQ - 2 Score 0 0 0  Altered sleeping 0 - -  Tired, decreased energy 0 - -  Change in appetite 0 - -  Feeling bad or failure about yourself  0 - -  Trouble concentrating 0 - -  Moving slowly or fidgety/restless 0 - -  Suicidal thoughts 0 - -  PHQ-9 Score 0 - -  Difficult doing work/chores Not difficult at all - -  Some recent data might be hidden    Social History   Tobacco Use   Smoking status: Former    Packs/day: 2.00    Types: Cigarettes    Quit date: 10/19/2014    Years since quitting: 7.1   Smokeless tobacco: Former  Scientific laboratory technician Use: Never used  Substance Use Topics   Alcohol use: No   Drug use: No    Review of Systems Per HPI unless specifically indicated above     Objective:    BP (!) 151/62    Pulse (!) 58    Ht 5\' 11"  (1.803 m)    Wt 229 lb (103.9 kg)    SpO2 96%    BMI 31.94 kg/m   Wt Readings from Last 3 Encounters:   12/26/21 229 lb (103.9 kg)  10/06/21 229 lb 3.2 oz (104 kg)  08/16/21 225 lb (102.1 kg)    Physical Exam Vitals and nursing note reviewed.  Constitutional:      General: He is not in acute distress.    Appearance: Normal appearance. He is well-developed. He is not diaphoretic.     Comments: Well-appearing, comfortable, cooperative  HENT:     Head: Normocephalic and atraumatic.  Eyes:     General:        Right eye: No discharge.        Left eye: No discharge.     Conjunctiva/sclera: Conjunctivae normal.  Cardiovascular:     Rate and Rhythm: Normal rate.  Pulmonary:     Effort: Pulmonary effort is normal.  Skin:    General: Skin is warm and dry.     Findings: No erythema or rash.  Neurological:     Mental Status: He is alert and oriented to person, place, and time.  Psychiatric:        Mood and Affect: Mood normal.  Behavior: Behavior normal.        Thought Content: Thought content normal.     Comments: Well groomed, good eye contact, normal speech and thoughts   Results for orders placed or performed in visit on 12/26/21  POCT Urinalysis Dipstick  Result Value Ref Range   Color, UA Yellow    Clarity, UA Cloudy    Glucose, UA Negative Negative   Bilirubin, UA Negative    Ketones, UA Negative    Spec Grav, UA 1.015 1.010 - 1.025   Blood, UA Moderate ++    pH, UA 6.0 5.0 - 8.0   Protein, UA Positive (A) Negative   Urobilinogen, UA 0.2 0.2 or 1.0 E.U./dL   Nitrite, UA Negative    Leukocytes, UA Large (3+) (A) Negative   Appearance     Odor        Assessment & Plan:   Problem List Items Addressed This Visit   None Visit Diagnoses     Acute cystitis with hematuria    -  Primary   Relevant Medications   ciprofloxacin (CIPRO) 500 MG tablet   Urinary frequency       Relevant Orders   POCT Urinalysis Dipstick (Completed)   Urine Culture   Antibiotic-induced yeast infection       Relevant Medications   fluconazole (DIFLUCAN) 150 MG tablet   BPH with  obstruction/lower urinary tract symptoms       Relevant Medications   tamsulosin (FLOMAX) 0.4 MG CAPS capsule       Clinically consistent with UTI and confirmed on UA. No concern for pyelo today (no systemic symptoms, neg fever, back pain, n/v).  Concern for BPH as etiology urinary dysfunction obstructive symptoms likely causing incomplete emptying and trigger UTI   Plan: 1.  UA today consistent with UTI 2. Ordered Urine culture 3. Cipro 500mg  BID x 7 days - will repeat same course Add Diflucan 4. Improve PO hydration 5. RTC if no improvement 1-2 weeks  Start Tamsulosin 0.4mg  daily for BPH May refer to urology in future if unsuccessful for significant severe BPH symptoms  Meds ordered this encounter  Medications   ciprofloxacin (CIPRO) 500 MG tablet    Sig: Take 1 tablet (500 mg total) by mouth 2 (two) times daily. For 7 days    Dispense:  14 tablet    Refill:  0   fluconazole (DIFLUCAN) 150 MG tablet    Sig: Take one tablet by mouth on Day 1. Repeat dose 2nd tablet on Day 3.    Dispense:  2 tablet    Refill:  0   tamsulosin (FLOMAX) 0.4 MG CAPS capsule    Sig: Take 1 capsule (0.4 mg total) by mouth daily after breakfast.    Dispense:  90 capsule    Refill:  3      Follow up plan: Return in about 4 months (around 04/13/2022) for return 5/26 (same day as AMW) for Urinary/BPH follow-up.  [name], [facility] 12/26/2021, 11:12 AM

## 2021-12-26 NOTE — Patient Instructions (Addendum)
Thank you for coming to the office today.  Likely BPH enlarged prostate limiting urine flow and causing you to go more often, increase risk of bacteria causing infection  Start Tamsulosin 0.4mg  daily after breakfast.  --------------  If need we can refer to Urologist.  1. You have a Urinary Tract Infection - this is very common, your symptoms are reassuring and you should get better within 1 week on the antibiotics - Start Cipro 500mg  2 times daily for next 7 days, complete entire course, even if feeling better - We sent urine for a culture, we will call you within next few days if we need to change antibiotics - Please drink plenty of fluids, improve hydration over next 1 week  If symptoms worsening, developing nausea / vomiting, worsening back pain, fevers / chills / sweats, then please return for re-evaluation sooner.  If you take AZO OTC - limit this to 2-3 days MAX to avoid affecting kidneys  D-Mannose is a natural supplement that can actually help bind to urinary bacteria and reduce their effectiveness it can help prevent UTI from forming, and may reduce some symptoms. It likely cannot cure an active UTI but it is worth a try and good to prevent them with. Try 500mg  twice a day at a full dose if you want, or check package instructions for more info   Please schedule a Follow-up Appointment to: Return in about 4 months (around 04/13/2022) for return 5/26 (same day as AMW) for Urinary/BPH follow-up.  If you have any other questions or concerns, please feel free to call the office or send a message through [place]. You may also schedule an earlier appointment if necessary.  Additionally, you may be receiving a survey about your experience at our office within a few days to 1 week by e-mail or mail. We value your feedback.  [name], DO [place]

## 2021-12-27 LAB — URINE CULTURE
MICRO NUMBER:: 12975400
SPECIMEN QUALITY:: ADEQUATE

## 2022-03-02 DIAGNOSIS — I129 Hypertensive chronic kidney disease with stage 1 through stage 4 chronic kidney disease, or unspecified chronic kidney disease: Secondary | ICD-10-CM | POA: Diagnosis not present

## 2022-03-02 DIAGNOSIS — I2111 ST elevation (STEMI) myocardial infarction involving right coronary artery: Secondary | ICD-10-CM | POA: Diagnosis not present

## 2022-03-02 DIAGNOSIS — I2510 Atherosclerotic heart disease of native coronary artery without angina pectoris: Secondary | ICD-10-CM | POA: Diagnosis not present

## 2022-03-02 DIAGNOSIS — R7303 Prediabetes: Secondary | ICD-10-CM | POA: Diagnosis not present

## 2022-03-02 DIAGNOSIS — I252 Old myocardial infarction: Secondary | ICD-10-CM | POA: Diagnosis not present

## 2022-03-02 DIAGNOSIS — N183 Chronic kidney disease, stage 3 unspecified: Secondary | ICD-10-CM | POA: Diagnosis not present

## 2022-03-02 DIAGNOSIS — E785 Hyperlipidemia, unspecified: Secondary | ICD-10-CM | POA: Diagnosis not present

## 2022-03-30 DIAGNOSIS — J449 Chronic obstructive pulmonary disease, unspecified: Secondary | ICD-10-CM | POA: Diagnosis not present

## 2022-04-09 DIAGNOSIS — I2510 Atherosclerotic heart disease of native coronary artery without angina pectoris: Secondary | ICD-10-CM | POA: Diagnosis not present

## 2022-04-09 DIAGNOSIS — I2111 ST elevation (STEMI) myocardial infarction involving right coronary artery: Secondary | ICD-10-CM | POA: Diagnosis not present

## 2022-04-09 DIAGNOSIS — I252 Old myocardial infarction: Secondary | ICD-10-CM | POA: Diagnosis not present

## 2022-04-10 ENCOUNTER — Ambulatory Visit: Payer: Medicare PPO

## 2022-04-10 NOTE — Progress Notes (Unsigned)
Subjective:   George Jensen is a 76 y.o. male who presents for Medicare Annual/Subsequent preventive examination.  Review of Systems    Per HPI unless specifically indicated below        Objective:    Today's Vitals   04/12/22 0804  BP: (!) 110/51  Pulse: (!) 55  Resp: 18  Temp: 97.9 F (36.6 C)  TempSrc: Oral  SpO2: 99%  Weight: 233 lb (105.7 kg)  Height: 5' 7.75" (1.721 m)  PainSc: 0-No pain   Body mass index is 35.69 kg/m.  Wt Readings from Last 3 Encounters:  04/12/22 233 lb (105.7 kg)  12/26/21 229 lb (103.9 kg)  10/06/21 229 lb 3.2 oz (104 kg)   Temp Readings from Last 3 Encounters:  04/12/22 97.9 F (36.6 C) (Oral)  07/21/21 97.6 F (36.4 C) (Oral)  03/31/21 97.6 F (36.4 C) (Oral)   BP Readings from Last 3 Encounters:  04/12/22 (!) 110/51  12/26/21 138/64  10/06/21 108/65   Pulse Readings from Last 3 Encounters:  04/12/22 (!) 55  12/26/21 (!) 58  10/06/21 76        04/04/2021    9:01 AM 03/29/2020    9:08 AM 10/07/2018   10:00 AM 06/11/2017   10:08 AM 09/16/2015    9:41 AM  Advanced Directives  Does Patient Have a Medical Advance Directive? No No No Yes No  Type of Theatre stage manager of [place];Living will   Copy of [place] in Chart?    No - copy requested   Would patient like information on creating a medical advance directive?  No - Patient declined   No - patient declined information    Current Medications (verified) Outpatient Encounter Medications as of 04/12/2022  Medication Sig   albuterol (PROVENTIL HFA;VENTOLIN HFA) 108 (90 BASE) MCG/ACT inhaler Inhale 2 puffs into the lungs every 4 (four) hours as needed for wheezing or shortness of breath (or shortness of breath).   aspirin 81 MG EC tablet TAKE 1 TABLET EVERY DAY   atorvastatin (LIPITOR) 80 MG tablet Take 1 tablet (80 mg total) by mouth at bedtime.   ezetimibe (ZETIA) 10 MG tablet Take 1 tablet (10 mg total) by mouth daily.   losartan  (COZAAR) 25 MG tablet TAKE 1 TABLET EVERY DAY   metoprolol tartrate (LOPRESSOR) 25 MG tablet Take 1 tablet (25 mg total) by mouth 2 (two) times daily.   Multiple Vitamins-Minerals (MULTIVITAMIN MEN 50+) TABS Take daily by mouth.   nitroGLYCERIN (NITROSTAT) 0.4 MG SL tablet Place 0.4 mg under the tongue every 5 (five) minutes as needed for chest pain. AS NEEDED   omeprazole (PRILOSEC) 40 MG capsule Take 1 capsule (40 mg total) by mouth daily.   sertraline (ZOLOFT) 50 MG tablet Take 1 tablet (50 mg total) by mouth daily.   tamsulosin (FLOMAX) 0.4 MG CAPS capsule Take 1 capsule (0.4 mg total) by mouth daily after breakfast.   tiotropium (SPIRIVA) 18 MCG inhalation capsule Place 18 mcg into inhaler and inhale every other day.    [DISCONTINUED] ciprofloxacin (CIPRO) 500 MG tablet Take 1 tablet (500 mg total) by mouth 2 (two) times daily. For 7 days (Patient not taking: Reported on 04/12/2022)   [DISCONTINUED] fluconazole (DIFLUCAN) 150 MG tablet Take one tablet by mouth on Day 1. Repeat dose 2nd tablet on Day 3. (Patient not taking: Reported on 04/12/2022)   No facility-administered encounter medications on file as of 04/12/2022.    Allergies (  verified) Patient has no known allergies.   History: Past Medical History:  Diagnosis Date   BCC (basal cell carcinoma)    x 2 on face   COPD (chronic obstructive pulmonary disease) (HCC)    Hyperlipidemia    Hypertension    Melanoma ([place])    Myocardial infarction ([place]) 10/2014   SCCA (squamous cell carcinoma) of skin    SCC x 1 on face   Past Surgical History:  Procedure Laterality Date   CAROTID STENT  10/2014   3 STENTS [facility]   MELANOMA EXCISION  1982   Family History  Problem Relation Age of Onset   Alzheimer's disease Mother    Diabetes Father    Heart disease Father    Social History   Socioeconomic History   Marital status: Married    Spouse name: Abdulai Blaylock   Number of children: 4   Years of education: Not on file    Highest education level: 9th grade  Occupational History   Occupation: Heavy Radio producer  Tobacco Use   Smoking status: Former    Packs/day: 2.00    Types: Cigarettes    Quit date: 10/19/2014    Years since quitting: 7.4   Smokeless tobacco: Former  Scientific laboratory technician Use: Never used  Substance and Sexual Activity   Alcohol use: No   Drug use: No   Sexual activity: Not on file  Other Topics Concern   Not on file  Social History Narrative   Not on file   Social Determinants of Health   Financial Resource Strain: Low Risk    Difficulty of Paying Living Expenses: Not hard at all  Food Insecurity: No Food Insecurity   Worried About Charity fundraiser in the Last Year: Never true   Arboriculturist in the Last Year: Never true  Transportation Needs: No Transportation Needs   Lack of Transportation (Medical): No   Lack of Transportation (Non-Medical): No  Physical Activity: Not on file  Stress: No Stress Concern Present   Feeling of Stress : Not at all  Social Connections: Moderately Isolated   Frequency of Communication with Friends and Family: More than three times a week   Frequency of Social Gatherings with Friends and Family: Not on file   Attends Religious Services: Never   Marine scientist or Organizations: No   Attends Music therapist: Never   Marital Status: Married    Tobacco Counseling Counseling given: Not Answered   Clinical Intake:  Pre-visit preparation completed: No  Pain : No/denies pain Pain Score: 0-No pain     Nutritional Status: BMI > 30  Obese Nutritional Risks: None Diabetes: No  How often do you need to have someone help you when you read instructions, pamphlets, or other written materials from your doctor or pharmacy?: 1 - Never  Diabetic?No     Information entered by :: Donnie Mesa, CMA   Activities of Daily Living    04/12/2022    8:10 AM  In your present state of health, do you have any  difficulty performing the following activities:  Hearing? 1  Vision? 1  Difficulty concentrating or making decisions? 0  Walking or climbing stairs? 0  Dressing or bathing? 0  Doing errands, shopping? 0    Patient Care Team: Olin Hauser, DO as PCP - General (Family Medicine) Erby Pian, MD as Referring Physician (Specialist) Anell Barr, [place] (Optometry) Ree Edman, MD (Dermatology) Paraschos,  Sheppard Coil, MD as Consulting Physician (Cardiology)  Indicate any recent Medical Services you may have received from other than [facility] providers in the past year (date may be approximate). No hospitalization in the past 12 months     Assessment:   This is a routine wellness examination for Mccormick.  Hearing/Vision screen Hearing Screening (Inadequate exam)  Method: Audiometry   '500Hz'$  '1000Hz'$  '2000Hz'$  '4000Hz'$   Right ear Fail Pass Pass Pass  Left ear Fail Fail Fail Fail   Vision Screening   Right eye Left eye Both eyes  Without correction     With correction '20/30 20/50 20/30 '$    Dietary issues and exercise activities discussed: Current Exercise Habits: The patient has a physically strenuous job, but has no regular exercise apart from work., Exercise limited by: None identified   Goals Addressed   None    Depression Screen    04/12/2022    8:14 AM 04/12/2022    8:09 AM 10/06/2021    9:00 AM 10/06/2021    8:59 AM 04/04/2021    9:02 AM 03/31/2021    8:29 AM 09/29/2020    8:35 AM  PHQ 2/9 Scores  PHQ - 2 Score 0 0 0 0 0 0 0  PHQ- 9 Score 0 0 0   0 0    Fall Risk    04/12/2022    8:09 AM 04/04/2021    9:02 AM 03/31/2021    8:29 AM 09/29/2020    8:14 AM 03/29/2020    8:30 AM  Fall Risk   Falls in the past year? 0 0 0 0 0  Number falls in past yr: 0   0 0  Injury with Fall? 0   0 0  Risk for fall due to : No Fall Risks Medication side effect     Follow up Falls evaluation completed Falls evaluation completed;Education provided;Falls prevention  discussed Falls evaluation completed Falls evaluation completed Falls evaluation completed    FALL RISK PREVENTION PERTAINING TO THE HOME:  Any stairs in or around the home? Yes  If so, are there any without handrails? Yes  Home free of loose throw rugs in walkways, pet beds, electrical cords, etc? Yes  Adequate lighting in your home to reduce risk of falls? Yes   ASSISTIVE DEVICES UTILIZED TO PREVENT FALLS:  Life alert? No  Use of a cane, walker or w/c? No  Grab bars in the bathroom? No  Shower chair or bench in shower? No  Elevated toilet seat or a handicapped toilet? No     TIMED UP AND GO:  Was the test performed? Yes .  Length of time to ambulate 10 feet: 10 sec.   Gait steady and fast without use of assistive device  Cognitive Function:        04/12/2022    8:38 AM 04/04/2021    9:03 AM 10/07/2018   10:04 AM 06/11/2017   10:11 AM  6CIT Screen  What Year? 0 points 0 points 0 points 0 points  What month? 0 points 0 points 0 points 0 points  What time? 0 points 0 points 0 points 0 points  Count back from 20 0 points 0 points 0 points 0 points  Months in reverse 0 points 2 points 0 points 0 points  Repeat phrase 2 points 8 points 2 points 0 points  Total Score 2 points 10 points 2 points 0 points    Immunizations Immunization History  Administered Date(s) Administered   Fluad Quad(high Dose 65+) 09/25/2019,  09/29/2020, 10/06/2021   Influenza, High Dose Seasonal PF 08/12/2015, 09/06/2016, 09/20/2017, 10/07/2018   Moderna SARS-COV2 Booster Vaccination 05/05/2021   Moderna Sars-Covid-2 Vaccination 03/24/2020, 04/21/2020, 10/30/2020   Pneumococcal Conjugate-13 08/12/2015, 09/06/2016   Pneumococcal Polysaccharide-23 09/27/2017    TDAP status: Up to date  Flu Vaccine status: Up to date  Pneumococcal vaccine status: Up to date  Covid-19 vaccine status: Completed vaccines  Qualifies for Shingles Vaccine? Yes   Zostavax completed No   Shingrix Completed?: No.     Education has been provided regarding the importance of this vaccine. Patient has been advised to call insurance company to determine out of pocket expense if they have not yet received this vaccine. Advised may also receive vaccine at local pharmacy or Health Dept. Verbalized acceptance and understanding.  Screening Tests Health Maintenance  Topic Date Due   COLONOSCOPY (Pts 45-60yr Insurance coverage will need to be confirmed)  Never done   Zoster Vaccines- Shingrix (1 of 2) Never done   COVID-19 Vaccine (4 - Booster for Moderna series) 06/30/2021   INFLUENZA VACCINE  06/19/2022   TETANUS/TDAP  09/15/2025   Pneumonia Vaccine 76 Years old  Completed   Hepatitis C Screening  Completed   HPV VACCINES  Aged Out    Health Maintenance  Health Maintenance Due  Topic Date Due   COLONOSCOPY (Pts 45-472yrInsurance coverage will need to be confirmed)  Never done   Zoster Vaccines- Shingrix (1 of 2) Never done   COVID-19 Vaccine (4 - Booster for Moderna series) 06/30/2021    Colorectal cancer screening: No longer required. Last colonoscopy completed 11/20/2011 The pt declined having another colonoscopy  Lung Cancer Screening: (Low Dose CT Chest recommended if Age 74-80ears, 30 pack-year currently smoking OR have quit w/in 15years.) does qualify.   Lung Cancer Screening Referral: does not qualify   Additional Screening:  Hepatitis C Screening: does qualify; Completed 04/08/2019  Vision Screening: Recommended annual ophthalmology exams for early detection of glaucoma and other disorders of the eye. Is the patient up to date with their annual eye exam?  Yes  Who is the provider or what is the name of the office in which the patient attends annual eye exams? [facility] If pt is not established with a provider, would they like to be referred to a provider to establish care? No .   Dental Screening: Recommended annual dental exams for proper oral hygiene  Community Resource Referral  / Chronic Care Management: CRR required this visit?  No   CCM required this visit?  No      Plan:     I have personally reviewed and noted the following in the patient's chart:   Medical and social history Use of alcohol, tobacco or illicit drugs  Current medications and supplements including opioid prescriptions. Patient is currently taking opioid prescriptions. Information provided to patient regarding non-opioid alternatives. Patient advised to discuss non-opioid treatment plan with their provider. Functional ability and status Nutritional status Physical activity Advanced directives List of other physicians Hospitalizations, surgeries, and ER visits in previous 12 months Vitals Screenings to include cognitive, depression, and falls Referrals and appointments  In addition, I have reviewed and discussed with patient certain preventive protocols, quality metrics, and best practice recommendations. A written personalized care plan for preventive services as well as general preventive health recommendations were provided to patient.     KeWilson [facility]CMRed [facility] 04/12/2022   Nurse Notes: Face to face AWV.     Mr.  Mayse , Thank you for taking time to come for your Medicare Wellness Visit. I appreciate your ongoing commitment to your health goals. Please review the following plan we discussed and let me know if I can assist you in the future.   These are the goals we discussed:  Goals      DIET - REDUCE SUGAR INTAKE     Recommend cutting out all sweets and junk food in diet to help aid in weight loss.       Increase water intake     Recommend drinking at least 5-6 glasses of water a day      Patient Stated     04/04/2021, no goals        This is a list of the screening recommended for you and due dates:  Health Maintenance  Topic Date Due   Colon Cancer Screening  Never done   Zoster (Shingles) Vaccine (1 of 2) Never done   COVID-19 Vaccine (4 - Booster for  Moderna series) 06/30/2021   Flu Shot  06/19/2022   Tetanus Vaccine  09/15/2025   Pneumonia Vaccine  Completed   Hepatitis C Screening: USPSTF Recommendation to screen - Ages 18-79 yo.  Completed   HPV Vaccine  Aged Out

## 2022-04-10 NOTE — Patient Instructions (Signed)
Health Maintenance, Male Adopting a healthy lifestyle and getting preventive care are important in promoting health and wellness. Ask your health care provider about: The right schedule for you to have regular tests and exams. Things you can do on your own to prevent diseases and keep yourself healthy. What should I know about diet, weight, and exercise? Eat a healthy diet  Eat a diet that includes plenty of vegetables, fruits, low-fat dairy products, and lean protein. Do not eat a lot of foods that are high in solid fats, added sugars, or sodium. Maintain a healthy weight Body mass index (BMI) is a measurement that can be used to identify possible weight problems. It estimates body fat based on height and weight. Your health care provider can help determine your BMI and help you achieve or maintain a healthy weight. Get regular exercise Get regular exercise. This is one of the most important things you can do for your health. Most adults should: Exercise for at least 150 minutes each week. The exercise should increase your heart rate and make you sweat (moderate-intensity exercise). Do strengthening exercises at least twice a week. This is in addition to the moderate-intensity exercise. Spend less time sitting. Even light physical activity can be beneficial. Watch cholesterol and blood lipids Have your blood tested for lipids and cholesterol at 74 years of age, then have this test every 5 years. You may need to have your cholesterol levels checked more often if: Your lipid or cholesterol levels are high. You are older than 74 years of age. You are at high risk for heart disease. What should I know about cancer screening? Many types of cancers can be detected early and may often be prevented. Depending on your health history and family history, you may need to have cancer screening at various ages. This may include screening for: Colorectal cancer. Prostate cancer. Skin cancer. Lung  cancer. What should I know about heart disease, diabetes, and high blood pressure? Blood pressure and heart disease High blood pressure causes heart disease and increases the risk of stroke. This is more likely to develop in people who have high blood pressure readings or are overweight. Talk with your health care provider about your target blood pressure readings. Have your blood pressure checked: Every 3-5 years if you are 18-39 years of age. Every year if you are 40 years old or older. If you are between the ages of 65 and 75 and are a current or former smoker, ask your health care provider if you should have a one-time screening for abdominal aortic aneurysm (AAA). Diabetes Have regular diabetes screenings. This checks your fasting blood sugar level. Have the screening done: Once every three years after age 45 if you are at a normal weight and have a low risk for diabetes. More often and at a younger age if you are overweight or have a high risk for diabetes. What should I know about preventing infection? Hepatitis B If you have a higher risk for hepatitis B, you should be screened for this virus. Talk with your health care provider to find out if you are at risk for hepatitis B infection. Hepatitis C Blood testing is recommended for: Everyone born from 1945 through 1965. Anyone with known risk factors for hepatitis C. Sexually transmitted infections (STIs) You should be screened each year for STIs, including gonorrhea and chlamydia, if: You are sexually active and are younger than 74 years of age. You are older than 74 years of age and your   health care provider tells you that you are at risk for this type of infection. Your sexual activity has changed since you were last screened, and you are at increased risk for chlamydia or gonorrhea. Ask your health care provider if you are at risk. Ask your health care provider about whether you are at high risk for HIV. Your health care provider  may recommend a prescription medicine to help prevent HIV infection. If you choose to take medicine to prevent HIV, you should first get tested for HIV. You should then be tested every 3 months for as long as you are taking the medicine. Follow these instructions at home: Alcohol use Do not drink alcohol if your health care provider tells you not to drink. If you drink alcohol: Limit how much you have to 0-2 drinks a day. Know how much alcohol is in your drink. In the [place] one drink equals one 12 oz bottle of beer (355 mL), one 5 oz glass of wine (148 mL), or one 1 oz glass of hard liquor (44 mL). Lifestyle Do not use any products that contain nicotine or tobacco. These products include cigarettes, chewing tobacco, and vaping devices, such as e-cigarettes. If you need help quitting, ask your health care provider. Do not use street drugs. Do not share needles. Ask your health care provider for help if you need support or information about quitting drugs. General instructions Schedule regular health, dental, and eye exams. Stay current with your vaccines. Tell your health care provider if: You often feel depressed. You have ever been abused or do not feel safe at home. Summary Adopting a healthy lifestyle and getting preventive care are important in promoting health and wellness. Follow your health care provider's instructions about healthy diet, exercising, and getting tested or screened for diseases. Follow your health care provider's instructions on monitoring your cholesterol and blood pressure. This information is not intended to replace advice given to you by your health care provider. Make sure you discuss any questions you have with your health care provider. Document Revised: 03/27/2021 Document Reviewed: 03/27/2021 Elsevier Patient Education  2023 Elsevier Inc.  

## 2022-04-12 ENCOUNTER — Ambulatory Visit (INDEPENDENT_AMBULATORY_CARE_PROVIDER_SITE_OTHER): Payer: Medicare PPO

## 2022-04-12 VITALS — BP 110/51 | HR 55 | Temp 97.9°F | Resp 18 | Ht 67.75 in | Wt 233.0 lb

## 2022-04-12 DIAGNOSIS — Z0000 Encounter for general adult medical examination without abnormal findings: Secondary | ICD-10-CM | POA: Diagnosis not present

## 2022-04-12 DIAGNOSIS — R94120 Abnormal auditory function study: Secondary | ICD-10-CM

## 2022-04-13 ENCOUNTER — Ambulatory Visit: Payer: Medicare PPO | Admitting: Family Medicine

## 2022-04-13 ENCOUNTER — Ambulatory Visit: Payer: Medicare PPO

## 2022-04-17 DIAGNOSIS — E785 Hyperlipidemia, unspecified: Secondary | ICD-10-CM | POA: Diagnosis not present

## 2022-04-17 DIAGNOSIS — I2510 Atherosclerotic heart disease of native coronary artery without angina pectoris: Secondary | ICD-10-CM | POA: Diagnosis not present

## 2022-04-17 DIAGNOSIS — J432 Centrilobular emphysema: Secondary | ICD-10-CM | POA: Diagnosis not present

## 2022-04-17 DIAGNOSIS — R7303 Prediabetes: Secondary | ICD-10-CM | POA: Diagnosis not present

## 2022-04-17 DIAGNOSIS — I129 Hypertensive chronic kidney disease with stage 1 through stage 4 chronic kidney disease, or unspecified chronic kidney disease: Secondary | ICD-10-CM | POA: Diagnosis not present

## 2022-04-17 DIAGNOSIS — N183 Chronic kidney disease, stage 3 unspecified: Secondary | ICD-10-CM | POA: Diagnosis not present

## 2022-04-17 DIAGNOSIS — I2111 ST elevation (STEMI) myocardial infarction involving right coronary artery: Secondary | ICD-10-CM | POA: Diagnosis not present

## 2022-04-17 DIAGNOSIS — I252 Old myocardial infarction: Secondary | ICD-10-CM | POA: Diagnosis not present

## 2022-04-27 DIAGNOSIS — H903 Sensorineural hearing loss, bilateral: Secondary | ICD-10-CM | POA: Diagnosis not present

## 2022-05-02 DIAGNOSIS — H43392 Other vitreous opacities, left eye: Secondary | ICD-10-CM | POA: Diagnosis not present

## 2022-05-02 DIAGNOSIS — H33191 Other retinoschisis and retinal cysts, right eye: Secondary | ICD-10-CM | POA: Diagnosis not present

## 2022-05-02 DIAGNOSIS — E119 Type 2 diabetes mellitus without complications: Secondary | ICD-10-CM | POA: Diagnosis not present

## 2022-05-02 DIAGNOSIS — Z961 Presence of intraocular lens: Secondary | ICD-10-CM | POA: Diagnosis not present

## 2022-05-02 DIAGNOSIS — H35363 Drusen (degenerative) of macula, bilateral: Secondary | ICD-10-CM | POA: Diagnosis not present

## 2022-05-02 LAB — HM DIABETES EYE EXAM

## 2022-06-01 ENCOUNTER — Other Ambulatory Visit: Payer: Self-pay | Admitting: Family Medicine

## 2022-06-01 DIAGNOSIS — K219 Gastro-esophageal reflux disease without esophagitis: Secondary | ICD-10-CM

## 2022-06-01 NOTE — Telephone Encounter (Signed)
Requested Prescriptions  Pending Prescriptions Disp Refills  . omeprazole (PRILOSEC) 40 MG capsule [Pharmacy Med Name: OMEPRAZOLE 40 MG Capsule Delayed Release] 90 capsule 1    Sig: TAKE 1 CAPSULE (40 MG TOTAL) BY MOUTH DAILY.     Gastroenterology: Proton Pump Inhibitors Passed - 06/01/2022  3:42 AM      Passed - Valid encounter within last 12 months    Recent Outpatient Visits          5 months ago Acute cystitis with hematuria   [place], DO   7 months ago Annual physical exam   [place], DO   9 months ago Acute cystitis with hematuria   [facility] Olin Hauser, DO   1 year ago Pre-diabetes   [facility] [place], Devonne Doughty, DO   1 year ago Annual physical exam   [facility] Olin Hauser, DO      Future Appointments            In 4 months Parks Ranger, Devonne Doughty, [facility], [facility]

## 2022-08-09 ENCOUNTER — Ambulatory Visit: Payer: Self-pay | Admitting: *Deleted

## 2022-08-09 NOTE — Telephone Encounter (Signed)
Left message to call back to discuss symptoms with a nurse.

## 2022-08-09 NOTE — Telephone Encounter (Signed)
Please call them back to notify  It is okay for him to do Urinalysis tomorrow Friday morning and then schedule him for the acute visit 4pm - he can do in person or virtual for that one.  Thank you  [name], [employer] 08/09/2022, 1:59 PM

## 2022-08-09 NOTE — Telephone Encounter (Signed)
  Chief Complaint: dysuria Symptoms: dysuria Frequency: today Pertinent Negatives: Pt wife states pt has been drinking soda more often  Disposition: '[]'$ ED /'[]'$ Urgent Care (no appt availability in office) / '[x]'$ Appointment(In office/virtual)/ '[]'$  [place] [facility]/ '[]'$ Home Care/ '[]'$ Refused Recommended Disposition /'[]'$  Mobile Bus/ '[x]'$  Follow-up with PCP Additional Notes: no appts available for tomorrow. Pt's wife asking if pt can come give urine sample in the morning. Advised I would send message back to have CMA fu with her. She wants CB at [phone]. 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   Reason for Disposition  All other males with painful urination  Answer Assessment - Initial Assessment Questions 1. SYMPTOM: "What's the main symptom you're concerned about?" (e.g., frequency, incontinence)     Dysuria  2. ONSET: "When did the  sx  start?"     Today  5. OTHER SYMPTOMS: "Do you have any other symptoms?" (e.g., blood in urine, fever, flank pain, pain with urination)     no  Answer Assessment - Initial Assessment Questions 1. SEVERITY: "How bad is the pain?"  (e.g., Scale 1-10; mild, moderate, or severe)   - MILD (1-3): Complains slightly about urination hurting.   - MODERATE (4-7): Interferes with normal activities.     - SEVERE (8-10): Excruciating, unwilling or unable to urinate because of the pain.      Mild  4. ONSET: "When did the painful urination start?"      today  Protocols used: Urinary Symptoms-A-AH, Urination Pain - Male-A-AH

## 2022-08-09 NOTE — Telephone Encounter (Signed)
Message from [employer] sent at 08/09/2022  9:35 AM EDT  Summary: urinary discomfort   The patient's wife has called to share that the patient has recently began to experience urinary discomfort   The patient shares that they've began to experience a slight burning sensation when urinating   The patient's wife shares that the patient has recently increased their soda and juice consumption   (The patient's wife would like for the patient to be encouraged to stop drinking juice and soda)   Please contact the patient further when possible to discuss their urinary discomfort   The patient was not with their wife at the time of call           Call History   Type Contact Phone/Fax User  08/09/2022 09:30 AM EDT Phone (Incoming) Lula Olszewski (Emergency Contact) [phone] Tessa Lerner A

## 2022-08-09 NOTE — Telephone Encounter (Signed)
Just spoke with his wife, Blanch Media. They are going to come in tomorrow morning to leave a urine sample. I have scheduled him for a telephone visit tomorrow afternoon at 4pm.

## 2022-08-10 ENCOUNTER — Encounter: Payer: Self-pay | Admitting: Family Medicine

## 2022-08-10 ENCOUNTER — Telehealth: Payer: Self-pay | Admitting: Family Medicine

## 2022-08-10 ENCOUNTER — Ambulatory Visit (INDEPENDENT_AMBULATORY_CARE_PROVIDER_SITE_OTHER): Payer: Medicare PPO | Admitting: Family Medicine

## 2022-08-10 VITALS — Ht 67.75 in | Wt 233.0 lb

## 2022-08-10 DIAGNOSIS — R350 Frequency of micturition: Secondary | ICD-10-CM | POA: Diagnosis not present

## 2022-08-10 DIAGNOSIS — N3000 Acute cystitis without hematuria: Secondary | ICD-10-CM | POA: Diagnosis not present

## 2022-08-10 DIAGNOSIS — N401 Enlarged prostate with lower urinary tract symptoms: Secondary | ICD-10-CM

## 2022-08-10 DIAGNOSIS — B379 Candidiasis, unspecified: Secondary | ICD-10-CM

## 2022-08-10 DIAGNOSIS — N138 Other obstructive and reflux uropathy: Secondary | ICD-10-CM

## 2022-08-10 LAB — POCT URINALYSIS DIPSTICK
Bilirubin, UA: NEGATIVE
Glucose, UA: NEGATIVE
Ketones, UA: NEGATIVE
Nitrite, UA: NEGATIVE
Protein, UA: POSITIVE — AB
Spec Grav, UA: 1.015 (ref 1.010–1.025)
Urobilinogen, UA: 0.2 E.U./dL
pH, UA: 6 (ref 5.0–8.0)

## 2022-08-10 MED ORDER — FLUCONAZOLE 150 MG PO TABS: ORAL_TABLET | ORAL | 0 refills | Status: DC

## 2022-08-10 MED ORDER — TAMSULOSIN HCL 0.4 MG PO CAPS: 0.4000 mg | ORAL_CAPSULE | Freq: Every day | ORAL | 3 refills | Status: DC

## 2022-08-10 MED ORDER — CEPHALEXIN 500 MG PO CAPS: 500.0000 mg | ORAL_CAPSULE | Freq: Three times a day (TID) | ORAL | 0 refills | Status: DC

## 2022-08-10 NOTE — Telephone Encounter (Signed)
Pts wife is calling to receive results of UA Please advise CB- [phone]

## 2022-08-10 NOTE — Patient Instructions (Addendum)
Thank you for coming to the office today.  1. You have a Urinary Tract Infection - this is very common, your symptoms are reassuring and you should get better within 1 week on the antibiotics - Start Keflex '500mg'$  3 times daily for next 7 days, complete entire course, even if feeling better - We sent urine for a culture, we will call you within next few days if we need to change antibiotics - Please drink plenty of fluids, improve hydration over next 1 week  Diflucan for yeast if needed.  If symptoms worsening, developing nausea / vomiting, worsening back pain, fevers / chills / sweats, then please return for re-evaluation sooner.  Tamsulosin for prostate was ordered to [facility] order.  Please schedule a Follow-up Appointment to: Return if symptoms worsen or fail to improve.  If you have any other questions or concerns, please feel free to call the office or send a message through [place]. You may also schedule an earlier appointment if necessary.  Additionally, you may be receiving a survey about your experience at our office within a few days to 1 week by e-mail or mail. We value your feedback.  [name], DO [place]

## 2022-08-10 NOTE — Progress Notes (Signed)
Virtual Visit via Telephone The purpose of this virtual visit is to provide medical care while limiting exposure to the novel coronavirus (COVID19) for both patient and office staff.  Consent was obtained for phone visit:  Yes.   Answered questions that patient had about telehealth interaction:  Yes.   I discussed the limitations, risks, security and privacy concerns of performing an evaluation and management service by telephone. I also discussed with the patient that there may be a patient responsible charge related to this service. The patient expressed understanding and agreed to proceed.  Patient Location: Home Provider Location: [facility] (Office)  Participants in virtual visit: - Patient: George Jensen "[name]" and Wife George Jensen - CMA: Orinda Kenner, CMA - Provider: Dr Parks Ranger  ---------------------------------------------------------------------- Chief Complaint  Patient presents with   Urinary Tract Infection    S: Reviewed CMA documentation. I have called patient and gathered additional HPI as follows:  UTI Reports that symptoms started 08/09/22 with woke up feeling urinary urgency and frequency, dysuria burning with lower pelvic pain vs discomfort.  Update since last visit 12/2021 he was started on Tamsulosin 0.'4mg'$  for BPH urinary symptoms, and he has had dramatic improvement in his urinary function.  Last treated for UTI 12/2021 with Cipro 500 BID x 7 days and Diflucan.  Denies fevers chills nausea vomiting, hematuria  Past Medical History:  Diagnosis Date   BCC (basal cell carcinoma)    x 2 on face   COPD (chronic obstructive pulmonary disease) (HCC)    Hyperlipidemia    Hypertension    Melanoma ([place])    Myocardial infarction ([place]) 10/2014   SCCA (squamous cell carcinoma) of skin    SCC x 1 on face   Social History   Tobacco Use   Smoking status: Former    Packs/day: 2.00    Types: Cigarettes    Quit date: 10/19/2014    Years  since quitting: 7.8   Smokeless tobacco: Former  Scientific laboratory technician Use: Never used  Substance Use Topics   Alcohol use: No   Drug use: No    Current Outpatient Medications:    albuterol (PROVENTIL HFA;VENTOLIN HFA) 108 (90 BASE) MCG/ACT inhaler, Inhale 2 puffs into the lungs every 4 (four) hours as needed for wheezing or shortness of breath (or shortness of breath)., Disp: 3 Inhaler, Rfl: 3   aspirin 81 MG EC tablet, TAKE 1 TABLET EVERY DAY, Disp: 100 tablet, Rfl: 3   atorvastatin (LIPITOR) 80 MG tablet, Take 1 tablet (80 mg total) by mouth at bedtime., Disp: 90 tablet, Rfl: 3   ezetimibe (ZETIA) 10 MG tablet, Take 1 tablet (10 mg total) by mouth daily., Disp: 90 tablet, Rfl: 3   losartan (COZAAR) 25 MG tablet, TAKE 1 TABLET EVERY DAY, Disp: 90 tablet, Rfl: 3   metoprolol tartrate (LOPRESSOR) 25 MG tablet, Take 1 tablet (25 mg total) by mouth 2 (two) times daily., Disp: 180 tablet, Rfl: 3   Multiple Vitamins-Minerals (MULTIVITAMIN MEN 50+) TABS, Take daily by mouth., Disp: , Rfl:    nitroGLYCERIN (NITROSTAT) 0.4 MG SL tablet, Place 0.4 mg under the tongue every 5 (five) minutes as needed for chest pain. AS NEEDED, Disp: , Rfl:    omeprazole (PRILOSEC) 40 MG capsule, TAKE 1 CAPSULE (40 MG TOTAL) BY MOUTH DAILY., Disp: 90 capsule, Rfl: 1   sertraline (ZOLOFT) 50 MG tablet, Take 1 tablet (50 mg total) by mouth daily., Disp: 90 tablet, Rfl: 3   tamsulosin (FLOMAX)  0.4 MG CAPS capsule, Take 1 capsule (0.4 mg total) by mouth daily after breakfast., Disp: 90 capsule, Rfl: 3   tiotropium (SPIRIVA) 18 MCG inhalation capsule, Place 18 mcg into inhaler and inhale every other day. , Disp: , Rfl:      04/12/2022    8:14 AM 04/12/2022    8:09 AM 10/06/2021    9:00 AM  Depression screen PHQ 2/9  Decreased Interest 0 0 0  Down, Depressed, Hopeless 0 0 0  PHQ - 2 Score 0 0 0  Altered sleeping 0 0 0  Tired, decreased energy 0 0 0  Change in appetite 0 0 0  Feeling bad or failure about yourself  0 0  0  Trouble concentrating 0 0 0  Moving slowly or fidgety/restless 0 0 0  Suicidal thoughts 0 0 0  PHQ-9 Score 0 0 0  Difficult doing work/chores Not difficult at all Not difficult at all Not difficult at all       03/31/2021    8:29 AM 09/29/2020    8:35 AM 09/25/2019    1:09 PM  GAD 7 : Generalized Anxiety Score  Nervous, Anxious, on Edge 0 0 0  Control/stop worrying 0 0 1  Worry too much - different things 0 0 1  Trouble relaxing 0 0 0  Restless 0 0 0  Easily annoyed or irritable 0 0 1  Afraid - awful might happen 0 0 0  Total GAD 7 Score 0 0 3  Anxiety Difficulty  Not difficult at all Somewhat difficult    -------------------------------------------------------------------------- O: No physical exam performed due to remote telephone encounter.  Lab results reviewed.  Recent Results (from the past 2160 hour(s))  POCT Urinalysis Dipstick     Status: Abnormal   Collection Time: 08/10/22 10:01 AM  Result Value Ref Range   Color, UA Yellow    Clarity, UA Cloudy    Glucose, UA Negative Negative   Bilirubin, UA Negative    Ketones, UA Negative    Spec Grav, UA 1.015 1.010 - 1.025   Blood, UA Moderate++    pH, UA 6.0 5.0 - 8.0   Protein, UA Positive (A) Negative   Urobilinogen, UA 0.2 0.2 or 1.0 E.U./dL   Nitrite, UA Negative    Leukocytes, UA Large (3+) (A) Negative   Appearance     Odor      -------------------------------------------------------------------------- A&P:  Problem List Items Addressed This Visit   None Visit Diagnoses     Urinary frequency    -  Primary   Relevant Orders   POCT Urinalysis Dipstick (Completed)   Urine Culture      Clinically consistent with UTI and confirmed on UA. No recent UTIs or abx courses. Last was UTI in 12/2021 treated with Cipro for also BPH component newly treated on tamsulosin No concern for pyelo today (no systemic symptoms, neg fever, back pain, n/v).  Plan: Urinalysis consistent with UTI leuks rbc 2. Ordered  Urine culture 3. Keflex '500mg'$  TID x 7 days - Add diflucan - Consider Cipro again that has worked 500 BID x 7 day for complicated UTI but this is very early and will treat with Cephalosporin now, that his prostate is treated on tamsulosin less likely prostatitis component. 4. Improve PO hydration 5. RTC if no improvement 1-2 weeks, red flags given to return sooner  BPH Significant improvement now on med Re order Tamsulosin to mail order  No orders of the defined types were placed in this encounter.  Follow-up: - Return as scheduled for routine f/u  Patient verbalizes understanding with the above medical recommendations including the limitation of remote medical advice.  Specific follow-up and call-back criteria were given for patient to follow-up or seek medical care more urgently if needed.   - Time spent in direct consultation with patient on phone: 7 minutes   [name], [facility] 08/10/2022, 4:23 PM

## 2022-08-11 LAB — URINE CULTURE
MICRO NUMBER:: 13955998
Result:: NO GROWTH
SPECIMEN QUALITY:: ADEQUATE

## 2022-08-13 NOTE — Telephone Encounter (Signed)
Results were discussed on Friday during his telephone visitl

## 2022-09-21 DIAGNOSIS — J449 Chronic obstructive pulmonary disease, unspecified: Secondary | ICD-10-CM | POA: Diagnosis not present

## 2022-09-21 DIAGNOSIS — R0602 Shortness of breath: Secondary | ICD-10-CM | POA: Diagnosis not present

## 2022-09-27 ENCOUNTER — Other Ambulatory Visit: Payer: Self-pay

## 2022-09-27 DIAGNOSIS — N183 Chronic kidney disease, stage 3 unspecified: Secondary | ICD-10-CM

## 2022-09-27 DIAGNOSIS — R351 Nocturia: Secondary | ICD-10-CM

## 2022-09-27 DIAGNOSIS — E785 Hyperlipidemia, unspecified: Secondary | ICD-10-CM

## 2022-09-27 DIAGNOSIS — Z0000 Encounter for general adult medical examination without abnormal findings: Secondary | ICD-10-CM

## 2022-09-27 DIAGNOSIS — R7303 Prediabetes: Secondary | ICD-10-CM

## 2022-09-28 ENCOUNTER — Other Ambulatory Visit: Payer: Medicare PPO

## 2022-09-28 DIAGNOSIS — R351 Nocturia: Secondary | ICD-10-CM | POA: Diagnosis not present

## 2022-09-28 DIAGNOSIS — I129 Hypertensive chronic kidney disease with stage 1 through stage 4 chronic kidney disease, or unspecified chronic kidney disease: Secondary | ICD-10-CM | POA: Diagnosis not present

## 2022-09-28 DIAGNOSIS — N183 Chronic kidney disease, stage 3 unspecified: Secondary | ICD-10-CM | POA: Diagnosis not present

## 2022-09-28 DIAGNOSIS — E785 Hyperlipidemia, unspecified: Secondary | ICD-10-CM | POA: Diagnosis not present

## 2022-09-28 DIAGNOSIS — R7303 Prediabetes: Secondary | ICD-10-CM | POA: Diagnosis not present

## 2022-09-28 DIAGNOSIS — Z0000 Encounter for general adult medical examination without abnormal findings: Secondary | ICD-10-CM | POA: Diagnosis not present

## 2022-09-29 LAB — LIPID PANEL
Cholesterol: 115 mg/dL (ref <200)
HDL: 38 mg/dL — ABNORMAL LOW (ref >=40)
LDL Cholesterol (Calc): 61 mg/dL (calc)
Non-HDL Cholesterol (Calc): 77 mg/dL (calc) (ref <130)
Total CHOL/HDL Ratio: 3 (calc) (ref <5.0)
Triglycerides: 77 mg/dL (ref <150)

## 2022-09-29 LAB — CBC WITH DIFFERENTIAL/PLATELET
Absolute Monocytes: 739 cells/uL (ref 200–950)
Basophils Absolute: 40 cells/uL (ref 0–200)
Basophils Relative: 0.6 %
Eosinophils Absolute: 238 cells/uL (ref 15–500)
Eosinophils Relative: 3.6 %
HCT: 37.1 % — ABNORMAL LOW (ref 38.5–50.0)
Hemoglobin: 12.5 g/dL — ABNORMAL LOW (ref 13.2–17.1)
Lymphs Abs: 1228 cells/uL (ref 850–3900)
MCH: 30.9 pg (ref 27.0–33.0)
MCHC: 33.7 g/dL (ref 32.0–36.0)
MCV: 91.8 fL (ref 80.0–100.0)
MPV: 10.6 fL (ref 7.5–12.5)
Monocytes Relative: 11.2 %
Neutro Abs: 4356 cells/uL (ref 1500–7800)
Neutrophils Relative %: 66 %
Platelets: 138 10*3/uL — ABNORMAL LOW (ref 140–400)
RBC: 4.04 10*6/uL — ABNORMAL LOW (ref 4.20–5.80)
RDW: 12.2 % (ref 11.0–15.0)
Total Lymphocyte: 18.6 %
WBC: 6.6 10*3/uL (ref 3.8–10.8)

## 2022-09-29 LAB — COMPLETE METABOLIC PANEL WITH GFR
AG Ratio: 1.7 (calc) (ref 1.0–2.5)
ALT: 19 U/L (ref 9–46)
AST: 24 U/L (ref 10–35)
Albumin: 4 g/dL (ref 3.6–5.1)
Alkaline phosphatase (APISO): 70 U/L (ref 35–144)
BUN/Creatinine Ratio: 19 (calc) (ref 6–22)
BUN: 31 mg/dL — ABNORMAL HIGH (ref 7–25)
CO2: 26 mmol/L (ref 20–32)
Calcium: 8.7 mg/dL (ref 8.6–10.3)
Chloride: 107 mmol/L (ref 98–110)
Creat: 1.64 mg/dL — ABNORMAL HIGH (ref 0.70–1.28)
Globulin: 2.3 g/dL (calc) (ref 1.9–3.7)
Glucose, Bld: 107 mg/dL — ABNORMAL HIGH (ref 65–99)
Potassium: 4.9 mmol/L (ref 3.5–5.3)
Sodium: 141 mmol/L (ref 135–146)
Total Bilirubin: 0.7 mg/dL (ref 0.2–1.2)
Total Protein: 6.3 g/dL (ref 6.1–8.1)
eGFR: 44 mL/min/{1.73_m2} — ABNORMAL LOW (ref >=60)

## 2022-09-29 LAB — HEMOGLOBIN A1C
Hgb A1c MFr Bld: 6.6 % of total Hgb — ABNORMAL HIGH (ref <5.7)
Mean Plasma Glucose: 143 mg/dL
eAG (mmol/L): 7.9 mmol/L

## 2022-09-29 LAB — TSH: TSH: 1.22 mIU/L (ref 0.40–4.50)

## 2022-09-29 LAB — PSA: PSA: 0.86 ng/mL (ref <=4.00)

## 2022-10-05 ENCOUNTER — Encounter: Payer: Self-pay | Admitting: Family Medicine

## 2022-10-05 ENCOUNTER — Ambulatory Visit (INDEPENDENT_AMBULATORY_CARE_PROVIDER_SITE_OTHER): Payer: Medicare PPO | Admitting: Family Medicine

## 2022-10-05 VITALS — BP 107/64 | HR 52 | Ht 67.75 in | Wt 236.8 lb

## 2022-10-05 DIAGNOSIS — Z0000 Encounter for general adult medical examination without abnormal findings: Secondary | ICD-10-CM

## 2022-10-05 DIAGNOSIS — N138 Other obstructive and reflux uropathy: Secondary | ICD-10-CM

## 2022-10-05 DIAGNOSIS — F325 Major depressive disorder, single episode, in full remission: Secondary | ICD-10-CM

## 2022-10-05 DIAGNOSIS — N401 Enlarged prostate with lower urinary tract symptoms: Secondary | ICD-10-CM

## 2022-10-05 DIAGNOSIS — K219 Gastro-esophageal reflux disease without esophagitis: Secondary | ICD-10-CM

## 2022-10-05 DIAGNOSIS — N3000 Acute cystitis without hematuria: Secondary | ICD-10-CM | POA: Diagnosis not present

## 2022-10-05 DIAGNOSIS — I2510 Atherosclerotic heart disease of native coronary artery without angina pectoris: Secondary | ICD-10-CM

## 2022-10-05 DIAGNOSIS — Z23 Encounter for immunization: Secondary | ICD-10-CM | POA: Diagnosis not present

## 2022-10-05 DIAGNOSIS — N183 Chronic kidney disease, stage 3 unspecified: Secondary | ICD-10-CM

## 2022-10-05 DIAGNOSIS — R7303 Prediabetes: Secondary | ICD-10-CM | POA: Diagnosis not present

## 2022-10-05 DIAGNOSIS — I129 Hypertensive chronic kidney disease with stage 1 through stage 4 chronic kidney disease, or unspecified chronic kidney disease: Secondary | ICD-10-CM

## 2022-10-05 DIAGNOSIS — E785 Hyperlipidemia, unspecified: Secondary | ICD-10-CM | POA: Diagnosis not present

## 2022-10-05 MED ORDER — LOSARTAN POTASSIUM 25 MG PO TABS: 25.0000 mg | ORAL_TABLET | Freq: Every day | ORAL | 3 refills | Status: DC

## 2022-10-05 MED ORDER — OMEPRAZOLE 40 MG PO CPDR: 40.0000 mg | DELAYED_RELEASE_CAPSULE | Freq: Every day | ORAL | 1 refills | Status: DC

## 2022-10-05 MED ORDER — SERTRALINE HCL 50 MG PO TABS: 50.0000 mg | ORAL_TABLET | Freq: Every day | ORAL | 3 refills | Status: DC

## 2022-10-05 MED ORDER — CEPHALEXIN 500 MG PO CAPS: 500.0000 mg | ORAL_CAPSULE | Freq: Three times a day (TID) | ORAL | 0 refills | Status: DC

## 2022-10-05 MED ORDER — EZETIMIBE 10 MG PO TABS: 10.0000 mg | ORAL_TABLET | Freq: Every day | ORAL | 3 refills | Status: DC

## 2022-10-05 MED ORDER — METOPROLOL TARTRATE 25 MG PO TABS: 25.0000 mg | ORAL_TABLET | Freq: Two times a day (BID) | ORAL | 3 refills | Status: DC

## 2022-10-05 MED ORDER — ATORVASTATIN CALCIUM 80 MG PO TABS: 80.0000 mg | ORAL_TABLET | Freq: Every day | ORAL | 3 refills | Status: DC

## 2022-10-05 NOTE — Patient Instructions (Addendum)
Thank you for coming to the office today.  All medicines ordered to [facility] for 1 year  If recurrent infection we can refer to Urology in 2024  1. You have a Urinary Tract Infection - this is very common, your symptoms are reassuring and you should get better within 1 week on the antibiotics - Start Keflex '500mg'$  3 times daily for next 7 days, complete entire course, even if feeling better - We sent urine for a culture, we will call you within next few days if we need to change antibiotics - Please drink plenty of fluids, improve hydration over next 1 week  If symptoms worsening, developing nausea / vomiting, worsening back pain, fevers / chills / sweats, then please return for re-evaluation sooner.  If you take AZO OTC - limit this to 2-3 days MAX to avoid affecting kidneys  D-Mannose is a natural supplement that can actually help bind to urinary bacteria and reduce their effectiveness it can help prevent UTI from forming, and may reduce some symptoms. It likely cannot cure an active UTI but it is worth a try and good to prevent them with. Try '500mg'$  twice a day at a full dose if you want, or check package instructions for more info   Please schedule a Follow-up Appointment to: Return in about 6 months (around 04/05/2023) for 6 month follow-up PreDM A1c, UTIs.  If you have any other questions or concerns, please feel free to call the office or send a message through [place]. You may also schedule an earlier appointment if necessary.  Additionally, you may be receiving a survey about your experience at our office within a few days to 1 week by e-mail or mail. We value your feedback.  [name], DO [place]

## 2022-10-05 NOTE — Assessment & Plan Note (Signed)
Stable, medically managed, s/p RCA STEMI and PCI Followed by [facility] On ASA, high intensity statin, ARB, BB With LDL goal < 70

## 2022-10-05 NOTE — Assessment & Plan Note (Signed)
Worsening control Pre-DM with A1c 6.6 at risk DIABETES Concern with obesity, HTN, HLD  Plan:  1. Not on any therapy currently - discuss options Metformin in future if indicated 2. Encourage improved lifestyle - low carb, low sugar diet, reduce portion size, continue improving regular exercise 3. Follow-up A1c 6 months

## 2022-10-05 NOTE — Assessment & Plan Note (Signed)
Controlled in remission Continues SSRI Sertraline still Consider other med change if needed in future

## 2022-10-05 NOTE — Assessment & Plan Note (Signed)
Controlled HTN - Home BP readings normal Complication with CKD-III   Plan:  1. Continue current BP regimen - Metoprolol '25mg'$  BID, Losartan '25mg'$  daily - continue ARB with CKD 2. Encourage improved lifestyle - low sodium diet, regular exercise 3. Continue monitor BP outside office, bring readings to next visit, if persistently >140/90 or new symptoms notify office sooner

## 2022-10-05 NOTE — Progress Notes (Signed)
Subjective:    Patient ID: George Jensen, male    DOB: 01-03-1946, 76 y.o.   MRN: [id number]  George Jensen is a 76 y.o. male presenting on 10/05/2022 for Annual Exam   HPI  Here for Annual Physical and Lab Review.   Pre-Diabetes: Results increased to A1c 6.6, previously 6.2 CBGs: Does not check CBG Meds: No medication (never on med) Currently on ARB Lifestyle: - Diet (states not adhering to low carb sweet diet, goal to improve) - Exercise (working outdoors, when weather nice, but not always exercising with walking) - Taking ASA 89m, Statin Denies hypoglycemia, polyuria, visual changes, numbness or tingling.   CHRONIC HTN / CKD-III Hyperkalemia resolved Known chronic CKD-III - improved Cr and GFR on last lab. Reports no concerns. Home BP readings are normal range Current Meds - Metoprolol 223mBID, Losartan 2548meports good compliance, took meds today. Tolerating well, w/o complaints. - Tries to stay well hydrated with water. Admits had eaten many bananas lately Denies CP, dyspnea, HA, edema, dizziness / lightheadedness    Panlobular Emphysema COPD, Former Smoker Followed by Dr FleRaul Delood report recently with doing very well on PFTs On Spiriva - Rarely using Albuterol PRN   HYPERLIPIDEMIA: - Reports no concerns. Last lipid panel 09/2022, controlled lipids on medicines - Currently taking Zetia and Atorvastatin, tolerating well without side effects or myalgias     Major Depression in Remission No new concerns, see PHQ On Sertraline 23m70mily 23mg59mly.   UTI Recent flare up UTI similar to before frequency dysuria Last antibiotic Keflex worked 2 x in past 1 year for UTI   Update since last visit 12/2021 he was started on Tamsulosin 0.4mg f79mBPH urinary symptoms, and he has had dramatic improvement in his urinary function.    Health Maintenance:  PSA 0.86 negative       10/05/2022    9:10 AM 04/12/2022    8:14 AM 04/12/2022    8:09 AM   Depression screen PHQ 2/9  Decreased Interest 0 0 0  Down, Depressed, Hopeless 0 0 0  PHQ - 2 Score 0 0 0  Altered sleeping 0 0 0  Tired, decreased energy 0 0 0  Change in appetite 0 0 0  Feeling bad or failure about yourself  0 0 0  Trouble concentrating 0 0 0  Moving slowly or fidgety/restless 0 0 0  Suicidal thoughts 0 0 0  PHQ-9 Score 0 0 0  Difficult doing work/chores Not difficult at all Not difficult at all Not difficult at all    Past Medical History:  Diagnosis Date   BCC (basal cell carcinoma)    x 2 on face   COPD (chronic obstructive pulmonary disease) (HCC)    Hyperlipidemia    Hypertension    Melanoma (HCC)  [place]   SCCA (squamous cell carcinoma) of skin    SCC x 1 on face   Past Surgical History:  Procedure Laterality Date   CAROTID STENT  10/2014   3 STENTS [facility]   MELANOMA EXCISION  1982   Social History   Socioeconomic History   Marital status: Married    Spouse name: George Takari Lundahlber of children: 4   Years of education: Not on file   Highest education level: 9th grade  Occupational History   Occupation: Heavy MachinRadio producercco Use   Smoking status: Former    Packs/day: 2.00    Types: Cigarettes  Quit date: 10/19/2014    Years since quitting: 7.9   Smokeless tobacco: Former  Scientific laboratory technician Use: Never used  Substance and Sexual Activity   Alcohol use: No   Drug use: No   Sexual activity: Not on file  Other Topics Concern   Not on file  Social History Narrative   Not on file   Social Determinants of Health   Financial Resource Strain: Low Risk  (04/12/2022)   Overall Financial Resource Strain (CARDIA)    Difficulty of Paying Living Expenses: Not hard at all  Food Insecurity: No Food Insecurity (04/12/2022)   Hunger Vital Sign    Worried About Running Out of Food in the Last Year: Never true    Ran Out of Food in the Last Year: Never true  Transportation Needs: No  Transportation Needs (04/12/2022)   PRAPARE - Hydrologist (Medical): No    Lack of Transportation (Non-Medical): No  Physical Activity: Insufficiently Active (04/04/2021)   Exercise Vital Sign    Days of Exercise per Week: 6 days    Minutes of Exercise per Session: 20 min  Stress: No Stress Concern Present (04/12/2022)   [place]    Feeling of Stress : Not at all  Social Connections: Moderately Isolated (04/12/2022)   Social Connection and Isolation Panel [NHANES]    Frequency of Communication with Friends and Family: More than three times a week    Frequency of Social Gatherings with Friends and Family: Not on file    Attends Religious Services: Never    Marine scientist or Organizations: No    Attends Archivist Meetings: Never    Marital Status: Married  Human resources officer Violence: Not At Risk (04/12/2022)   Humiliation, Afraid, Rape, and Kick questionnaire    Fear of Current or Ex-Partner: No    Emotionally Abused: No    Physically Abused: No    Sexually Abused: No   Family History  Problem Relation Age of Onset   Alzheimer's disease Mother    Diabetes Father    Heart disease Father    Current Outpatient Medications on File Prior to Visit  Medication Sig   albuterol (PROVENTIL HFA;VENTOLIN HFA) 108 (90 BASE) MCG/ACT inhaler Inhale 2 puffs into the lungs every 4 (four) hours as needed for wheezing or shortness of breath (or shortness of breath).   aspirin 81 MG EC tablet TAKE 1 TABLET EVERY DAY   Multiple Vitamins-Minerals (MULTIVITAMIN MEN 50+) TABS Take daily by mouth.   nitroGLYCERIN (NITROSTAT) 0.4 MG SL tablet Place 0.4 mg under the tongue every 5 (five) minutes as needed for chest pain. AS NEEDED   tamsulosin (FLOMAX) 0.4 MG CAPS capsule Take 1 capsule (0.4 mg total) by mouth daily after breakfast.   tiotropium (SPIRIVA) 18 MCG inhalation capsule Place 18 mcg into  inhaler and inhale every other day.    No current facility-administered medications on file prior to visit.    Review of Systems  Constitutional:  Negative for activity change, appetite change, chills, diaphoresis, fatigue and fever.  HENT:  Negative for congestion and hearing loss.   Eyes:  Negative for visual disturbance.  Respiratory:  Negative for cough, chest tightness, shortness of breath and wheezing.   Cardiovascular:  Negative for chest pain, palpitations and leg swelling.  Gastrointestinal:  Negative for abdominal pain, constipation, diarrhea, nausea and vomiting.  Genitourinary:  Negative for dysuria, frequency and hematuria.  Musculoskeletal:  Negative for arthralgias and neck pain.  Skin:  Negative for rash.  Neurological:  Negative for dizziness, weakness, light-headedness, numbness and headaches.  Hematological:  Negative for adenopathy.  Psychiatric/Behavioral:  Negative for behavioral problems, dysphoric mood and sleep disturbance.    Per HPI unless specifically indicated above     Objective:    BP 107/64   Pulse (!) 52   Ht 5' 7.75" (1.721 m)   Wt 236 lb 12.8 oz (107.4 kg)   SpO2 98%   BMI 36.27 kg/m   Wt Readings from Last 3 Encounters:  10/05/22 236 lb 12.8 oz (107.4 kg)  08/10/22 233 lb (105.7 kg)  04/12/22 233 lb (105.7 kg)    Physical Exam Vitals and nursing note reviewed.  Constitutional:      General: He is not in acute distress.    Appearance: He is well-developed. He is not diaphoretic.     Comments: Well-appearing, comfortable, cooperative  HENT:     Head: Normocephalic and atraumatic.  Eyes:     General:        Right eye: No discharge.        Left eye: No discharge.     Conjunctiva/sclera: Conjunctivae normal.     Pupils: Pupils are equal, round, and reactive to light.  Neck:     Thyroid: No thyromegaly.     Vascular: No carotid bruit.  Cardiovascular:     Rate and Rhythm: Normal rate and regular rhythm.     Pulses: Normal pulses.      Heart sounds: Normal heart sounds. No murmur heard. Pulmonary:     Effort: Pulmonary effort is normal. No respiratory distress.     Breath sounds: Normal breath sounds. No wheezing or rales.  Abdominal:     General: Bowel sounds are normal. There is no distension.     Palpations: Abdomen is soft. There is no mass.     Tenderness: There is no abdominal tenderness.  Musculoskeletal:        General: No tenderness. Normal range of motion.     Cervical back: Normal range of motion and neck supple.     Right lower leg: No edema.     Left lower leg: No edema.     Comments: Upper / Lower Extremities: - Normal muscle tone, strength bilateral upper extremities 5/5, lower extremities 5/5  Lymphadenopathy:     Cervical: No cervical adenopathy.  Skin:    General: Skin is warm and dry.     Findings: No erythema or rash.  Neurological:     Mental Status: He is alert and oriented to person, place, and time.     Comments: Distal sensation intact to light touch all extremities  Psychiatric:        Mood and Affect: Mood normal.        Behavior: Behavior normal.        Thought Content: Thought content normal.     Comments: Well groomed, good eye contact, normal speech and thoughts    Results for orders placed or performed in visit on 09/27/22  TSH  Result Value Ref Range   TSH 1.22 0.40 - 4.50 mIU/L  PSA  Result Value Ref Range   PSA 0.86 < OR = 4.00 ng/mL  Hemoglobin A1c  Result Value Ref Range   Hgb A1c MFr Bld 6.6 (H) <5.7 % of total Hgb   Mean Plasma Glucose 143 mg/dL   eAG (mmol/L) 7.9 mmol/L  CBC with Differential/Platelet  Result Value  Ref Range   WBC 6.6 3.8 - 10.8 Thousand/uL   RBC 4.04 (L) 4.20 - 5.80 Million/uL   Hemoglobin 12.5 (L) 13.2 - 17.1 g/dL   HCT 37.1 (L) 38.5 - 50.0 %   MCV 91.8 80.0 - 100.0 fL   MCH 30.9 27.0 - 33.0 pg   MCHC 33.7 32.0 - 36.0 g/dL   RDW 12.2 11.0 - 15.0 %   Platelets 138 (L) 140 - 400 Thousand/uL   MPV 10.6 7.5 - 12.5 fL   Neutro Abs 4,356  1,500 - 7,800 cells/uL   Lymphs Abs 1,228 850 - 3,900 cells/uL   Absolute Monocytes 739 200 - 950 cells/uL   Eosinophils Absolute 238 15 - 500 cells/uL   Basophils Absolute 40 0 - 200 cells/uL   Neutrophils Relative % 66 %   Total Lymphocyte 18.6 %   Monocytes Relative 11.2 %   Eosinophils Relative 3.6 %   Basophils Relative 0.6 %  Lipid panel  Result Value Ref Range   Cholesterol 115 <200 mg/dL   HDL 38 (L) > OR = 40 mg/dL   Triglycerides 77 <150 mg/dL   LDL Cholesterol (Calc) 61 mg/dL (calc)   Total CHOL/HDL Ratio 3.0 <5.0 (calc)   Non-HDL Cholesterol (Calc) 77 <130 mg/dL (calc)  COMPLETE METABOLIC PANEL WITH GFR  Result Value Ref Range   Glucose, Bld 107 (H) 65 - 99 mg/dL   BUN 31 (H) 7 - 25 mg/dL   Creat 1.64 (H) 0.70 - 1.28 mg/dL   eGFR 44 (L) > OR = 60 mL/min/1.3m   BUN/Creatinine Ratio 19 6 - 22 (calc)   Sodium 141 135 - 146 mmol/L   Potassium 4.9 3.5 - 5.3 mmol/L   Chloride 107 98 - 110 mmol/L   CO2 26 20 - 32 mmol/L   Calcium 8.7 8.6 - 10.3 mg/dL   Total Protein 6.3 6.1 - 8.1 g/dL   Albumin 4.0 3.6 - 5.1 g/dL   Globulin 2.3 1.9 - 3.7 g/dL (calc)   AG Ratio 1.7 1.0 - 2.5 (calc)   Total Bilirubin 0.7 0.2 - 1.2 mg/dL   Alkaline phosphatase (APISO) 70 35 - 144 U/L   AST 24 10 - 35 U/L   ALT 19 9 - 46 U/L      Assessment & Plan:   Problem List Items Addressed This Visit     Benign hypertension with CKD (chronic kidney disease) stage III (HCC)    Controlled HTN - Home BP readings normal Complication with CKD-III   Plan:  1. Continue current BP regimen - Metoprolol 256mBID, Losartan 2539maily - continue ARB with CKD 2. Encourage improved lifestyle - low sodium diet, regular exercise 3. Continue monitor BP outside office, bring readings to next visit, if persistently >140/90 or new symptoms notify office sooner      Relevant Medications   atorvastatin (LIPITOR) 80 MG tablet   ezetimibe (ZETIA) 10 MG tablet   losartan (COZAAR) 25 MG tablet   metoprolol  tartrate (LOPRESSOR) 25 MG tablet   CAD, multiple vessel    Stable, medically managed, s/p RCA STEMI and PCI [facility]SA, high intensity statin, ARB, BB With LDL goal < 70      Relevant Medications   atorvastatin (LIPITOR) 80 MG tablet   ezetimibe (ZETIA) 10 MG tablet   losartan (COZAAR) 25 MG tablet   metoprolol tartrate (LOPRESSOR) 25 MG tablet   Dyslipidemia   Relevant Medications   atorvastatin (LIPITOR) 80 MG tablet  ezetimibe (ZETIA) 10 MG tablet   GERD (gastroesophageal reflux disease)   Relevant Medications   omeprazole (PRILOSEC) 40 MG capsule   Major depression in remission ([place])    Controlled in remission Continues SSRI Sertraline still Consider other med change if needed in future      Relevant Medications   sertraline (ZOLOFT) 50 MG tablet   Pre-diabetes    Worsening control Pre-DM with A1c 6.6 at risk DIABETES Concern with obesity, HTN, HLD  Plan:  1. Not on any therapy currently - discuss options Metformin in future if indicated 2. Encourage improved lifestyle - low carb, low sugar diet, reduce portion size, continue improving regular exercise 3. Follow-up A1c 6 months       Other Visit Diagnoses     Annual physical exam    -  Primary   Needs flu shot       Relevant Orders   Flu Vaccine QUAD High Dose(Fluad) (Completed)   BPH with obstruction/lower urinary tract symptoms       Acute cystitis without hematuria       Relevant Medications   cephALEXin (KEFLEX) 500 MG capsule   Other Relevant Orders   Urinalysis, Routine w reflex microscopic   Urine Culture       Updated Health Maintenance information Reviewed recent lab results with patient Encouraged improvement to lifestyle with diet and exercise Goal of weight loss  Clinically consistent with UTI and confirmed on UA. Last UTI 11/6107 No complication  Plan: Urinalysis + Urine culture today Empiric Keflex 517m TID x 7 days 4. Improve PO hydration Start D-Mannose  OTC prevention Future if recurrent refer to Urology  Orders Placed This Encounter  Procedures   Urine Culture   Flu Vaccine QUAD High Dose(Fluad)   Urinalysis, Routine w reflex microscopic     Meds ordered this encounter  Medications   cephALEXin (KEFLEX) 500 MG capsule    Sig: Take 1 capsule (500 mg total) by mouth 3 (three) times daily. For 7 days    Dispense:  21 capsule    Refill:  0   atorvastatin (LIPITOR) 80 MG tablet    Sig: Take 1 tablet (80 mg total) by mouth at bedtime.    Dispense:  90 tablet    Refill:  3   ezetimibe (ZETIA) 10 MG tablet    Sig: Take 1 tablet (10 mg total) by mouth daily.    Dispense:  90 tablet    Refill:  3   losartan (COZAAR) 25 MG tablet    Sig: Take 1 tablet (25 mg total) by mouth daily.    Dispense:  90 tablet    Refill:  3   metoprolol tartrate (LOPRESSOR) 25 MG tablet    Sig: Take 1 tablet (25 mg total) by mouth 2 (two) times daily.    Dispense:  180 tablet    Refill:  3   omeprazole (PRILOSEC) 40 MG capsule    Sig: Take 1 capsule (40 mg total) by mouth daily.    Dispense:  90 capsule    Refill:  1   sertraline (ZOLOFT) 50 MG tablet    Sig: Take 1 tablet (50 mg total) by mouth daily.    Dispense:  90 tablet    Refill:  3      Follow up plan: Return in about 6 months (around 04/05/2023) for 6 month follow-up PreDM A1c, UTIs.    ANobie Putnam DO [facility] 10/05/2022, 9:00 AM

## 2022-10-06 LAB — URINE CULTURE
MICRO NUMBER:: 14205404
Result:: NO GROWTH
SPECIMEN QUALITY:: ADEQUATE

## 2022-10-06 LAB — URINALYSIS, ROUTINE W REFLEX MICROSCOPIC
Bilirubin Urine: NEGATIVE
Glucose, UA: NEGATIVE
Hgb urine dipstick: NEGATIVE
Hyaline Cast: NONE SEEN /LPF
Ketones, ur: NEGATIVE
Nitrite: NEGATIVE
Protein, ur: NEGATIVE
RBC / HPF: NONE SEEN /HPF (ref 0–2)
Specific Gravity, Urine: 1.009 (ref 1.001–1.035)
pH: 7 (ref 5.0–8.0)

## 2022-10-19 DIAGNOSIS — I2111 ST elevation (STEMI) myocardial infarction involving right coronary artery: Secondary | ICD-10-CM | POA: Diagnosis not present

## 2022-10-19 DIAGNOSIS — I129 Hypertensive chronic kidney disease with stage 1 through stage 4 chronic kidney disease, or unspecified chronic kidney disease: Secondary | ICD-10-CM | POA: Diagnosis not present

## 2022-10-19 DIAGNOSIS — I2510 Atherosclerotic heart disease of native coronary artery without angina pectoris: Secondary | ICD-10-CM | POA: Diagnosis not present

## 2022-10-19 DIAGNOSIS — E785 Hyperlipidemia, unspecified: Secondary | ICD-10-CM | POA: Diagnosis not present

## 2022-10-19 DIAGNOSIS — N183 Chronic kidney disease, stage 3 unspecified: Secondary | ICD-10-CM | POA: Diagnosis not present

## 2022-10-19 DIAGNOSIS — J432 Centrilobular emphysema: Secondary | ICD-10-CM | POA: Diagnosis not present

## 2022-10-19 DIAGNOSIS — I252 Old myocardial infarction: Secondary | ICD-10-CM | POA: Diagnosis not present

## 2022-10-19 DIAGNOSIS — N1831 Chronic kidney disease, stage 3a: Secondary | ICD-10-CM | POA: Diagnosis not present

## 2023-03-29 ENCOUNTER — Telehealth: Payer: Self-pay | Admitting: Family Medicine

## 2023-03-29 NOTE — Telephone Encounter (Signed)
Called patient to schedule Medicare Annual Wellness Visit (AWV). Left message for patient to call back and schedule Medicare Annual Wellness Visit (AWV).  Last date of AWV: 04/12/22  Please schedule an appointment at any time with Lorrie Barnes, LPN  .  If any questions, please contact me.  Thank you ,  Matsuko Kretz Harris-Coley; Care Guide Ambulatory Clinical Support [facility] Direct Dial: [phone]   

## 2023-03-29 NOTE — Telephone Encounter (Signed)
Contacted George Jensen to schedule their annual wellness visit. Appointment made for 04/19/2023.  Verlee Rossetti; Care Guide Ambulatory Clinical Support [facility] Direct Dial: [phone]

## 2023-04-05 ENCOUNTER — Encounter: Payer: Self-pay | Admitting: Family Medicine

## 2023-04-05 ENCOUNTER — Ambulatory Visit (INDEPENDENT_AMBULATORY_CARE_PROVIDER_SITE_OTHER): Payer: Medicare PPO | Admitting: Family Medicine

## 2023-04-05 ENCOUNTER — Telehealth: Payer: Self-pay

## 2023-04-05 ENCOUNTER — Other Ambulatory Visit: Payer: Self-pay | Admitting: Family Medicine

## 2023-04-05 VITALS — BP 120/68 | HR 59 | Ht 67.75 in | Wt 236.0 lb

## 2023-04-05 DIAGNOSIS — J431 Panlobular emphysema: Secondary | ICD-10-CM | POA: Diagnosis not present

## 2023-04-05 DIAGNOSIS — I129 Hypertensive chronic kidney disease with stage 1 through stage 4 chronic kidney disease, or unspecified chronic kidney disease: Secondary | ICD-10-CM

## 2023-04-05 DIAGNOSIS — N138 Other obstructive and reflux uropathy: Secondary | ICD-10-CM | POA: Insufficient documentation

## 2023-04-05 DIAGNOSIS — Z0000 Encounter for general adult medical examination without abnormal findings: Secondary | ICD-10-CM

## 2023-04-05 DIAGNOSIS — K5900 Constipation, unspecified: Secondary | ICD-10-CM | POA: Diagnosis not present

## 2023-04-05 DIAGNOSIS — R7303 Prediabetes: Secondary | ICD-10-CM

## 2023-04-05 DIAGNOSIS — N1831 Chronic kidney disease, stage 3a: Secondary | ICD-10-CM

## 2023-04-05 DIAGNOSIS — N183 Chronic kidney disease, stage 3 unspecified: Secondary | ICD-10-CM | POA: Diagnosis not present

## 2023-04-05 DIAGNOSIS — F325 Major depressive disorder, single episode, in full remission: Secondary | ICD-10-CM

## 2023-04-05 DIAGNOSIS — N401 Enlarged prostate with lower urinary tract symptoms: Secondary | ICD-10-CM | POA: Diagnosis not present

## 2023-04-05 DIAGNOSIS — I2510 Atherosclerotic heart disease of native coronary artery without angina pectoris: Secondary | ICD-10-CM

## 2023-04-05 DIAGNOSIS — E785 Hyperlipidemia, unspecified: Secondary | ICD-10-CM

## 2023-04-05 LAB — POCT GLYCOSYLATED HEMOGLOBIN (HGB A1C): Hemoglobin A1C: 6.2 % — AB (ref 4.0–5.6)

## 2023-04-05 MED ORDER — AMITIZA 24 MCG PO CAPS
24.0000 ug | ORAL_CAPSULE | Freq: Every day | ORAL | 1 refills | Status: DC
Start: 2023-04-05 — End: 2023-10-15

## 2023-04-05 MED ORDER — DOCUSATE SODIUM 100 MG PO CAPS: 100.0000 mg | ORAL_CAPSULE | Freq: Two times a day (BID) | ORAL | 2 refills | Status: DC | PRN

## 2023-04-05 NOTE — Assessment & Plan Note (Signed)
Controlled HTN - Home BP readings normal Complication with CKD-III stable on prior lab   Plan:  1. Continue current BP regimen - Metoprolol 25mg  BID, Losartan 25mg  daily - continue ARB with CKD 2. Encourage improved lifestyle - low sodium diet, regular exercise 3. Continue monitor BP outside office, bring readings to next visit, if persistently >140/90 or new symptoms notify office sooner

## 2023-04-05 NOTE — Patient Instructions (Addendum)
Thank you for coming to the office today.  Recent Labs    09/28/22 0751 04/05/23 0923  HGBA1C 6.6* 6.2*   Ordered Colace 100mg  twice a day for constipation as needed.  DUE for FASTING BLOOD WORK (no food or drink after midnight before the lab appointment, only water or coffee without cream/sugar on the morning of)  SCHEDULE "Lab Only" visit in the morning at the clinic for lab draw in 6 MONTHS   - Make sure Lab Only appointment is at about 1 week before your next appointment, so that results will be available  For Lab Results, once available within 2-3 days of blood draw, you can can log in to MyChart online to view your results and a brief explanation. Also, we can discuss results at next follow-up visit.   Please schedule a Follow-up Appointment to: Return in about 6 months (around 10/06/2023) for 6 month fasting lab only then 1 week later Annual Physical.  If you have any other questions or concerns, please feel free to call the office or send a message through MyChart. You may also schedule an earlier appointment if necessary.  Additionally, you may be receiving a survey about your experience at our office within a few days to 1 week by e-mail or mail. We value your feedback.  Saralyn Pilar, DO [facility], [place]

## 2023-04-05 NOTE — Assessment & Plan Note (Addendum)
Controlled in remission Continues SSRI Sertraline 50mg  Consider other med change if needed in future

## 2023-04-05 NOTE — Assessment & Plan Note (Signed)
Improved A1c 6.2 Concern with obesity, HTN, HLD  Plan:  1. Not on any therapy currently - discuss options Metformin in future if indicated 2. Encourage improved lifestyle - low carb, low sugar diet, reduce portion size, continue improving regular exercise 3. Follow-up A1c 6 months

## 2023-04-05 NOTE — Progress Notes (Signed)
Subjective:    Patient ID: George Jensen, male    DOB: May 28, 1946, 77 y.o.   MRN: [id number]  George Jensen is a 77 y.o. male presenting on 04/05/2023 for Medical Management of Chronic Issues   HPI  Pre-Diabetes: Last A1c 6.6 higher, now improved to 6.2 today CBGs: Does not check CBG Meds: No medication (never on med) Currently on ARB Lifestyle: - Diet (states not adhering to low carb sweet diet, goal to improve) - Exercise (working outdoors, when weather nice, but not always exercising with walking) - Taking ASA 81mg , Statin Denies hypoglycemia, polyuria, visual changes, numbness or tingling.   CHRONIC HTN / CKD-III Hyperkalemia resolved Known chronic CKD-III - improved Cr and GFR on last lab. previously Reports no concerns. Home BP readings are normal range Current Meds - Metoprolol 25mg  BID, Losartan 25mg  Reports good compliance, took meds today. Tolerating well, w/o complaints. - Tries to stay well hydrated with water. Admits had eaten many bananas lately Denies CP, dyspnea, HA, edema, dizziness / lightheadedness    Panlobular Emphysema COPD, Former Smoker Followed by Dr Meredeth Ide, good report recently with doing very well on PFTs On Spiriva - Rarely using Albuterol PRN   HYPERLIPIDEMIA: - Reports no concerns. Last lipid panel 09/2022, controlled lipids on medicines - Currently taking Zetia and Atorvastatin, tolerating well without side effects or myalgias     Major Depression in Remission No new concerns, see PHQ On Sertraline 50mg  daily 50mg  daily.  Constipation - Recent problem worsening. Tried medicine in the past stool softener, asking about restarting.   BPH Update since last visit 12/2021 he was started on Tamsulosin 0.4mg  for BPH urinary symptoms, and he has had dramatic improvement in his urinary function.        04/05/2023    9:35 AM 10/05/2022    9:10 AM 04/12/2022    8:14 AM  Depression screen PHQ 2/9  Decreased Interest 0 0 0  Down,  Depressed, Hopeless 0 0 0  PHQ - 2 Score 0 0 0  Altered sleeping  0 0  Tired, decreased energy  0 0  Change in appetite  0 0  Feeling bad or failure about yourself   0 0  Trouble concentrating  0 0  Moving slowly or fidgety/restless  0 0  Suicidal thoughts  0 0  PHQ-9 Score  0 0  Difficult doing work/chores  Not difficult at all Not difficult at all    Social History   Tobacco Use   Smoking status: Former    Packs/day: 2    Types: Cigarettes    Quit date: 10/19/2014    Years since quitting: 8.4   Smokeless tobacco: Former  Building services engineer Use: Never used  Substance Use Topics   Alcohol use: No   Drug use: No    Review of Systems Per HPI unless specifically indicated above     Objective:    BP 120/68   Pulse (!) 59   Ht 5' 7.75" (1.721 m)   Wt 236 lb (107 kg)   SpO2 98%   BMI 36.15 kg/m   Wt Readings from Last 3 Encounters:  04/05/23 236 lb (107 kg)  10/05/22 236 lb 12.8 oz (107.4 kg)  08/10/22 233 lb (105.7 kg)    Physical Exam Vitals and nursing note reviewed.  Constitutional:      General: He is not in acute distress.    Appearance: He is well-developed. He is not diaphoretic.  Comments: Well-appearing, comfortable, cooperative  HENT:     Head: Normocephalic and atraumatic.  Eyes:     General:        Right eye: No discharge.        Left eye: No discharge.     Conjunctiva/sclera: Conjunctivae normal.  Neck:     Thyroid: No thyromegaly.  Cardiovascular:     Rate and Rhythm: Normal rate and regular rhythm.     Pulses: Normal pulses.     Heart sounds: Normal heart sounds. No murmur heard. Pulmonary:     Effort: Pulmonary effort is normal. No respiratory distress.     Breath sounds: Normal breath sounds. No wheezing or rales.  Musculoskeletal:        General: Normal range of motion.     Cervical back: Normal range of motion and neck supple.     Right lower leg: No edema.     Left lower leg: No edema.  Lymphadenopathy:     Cervical: No  cervical adenopathy.  Skin:    General: Skin is warm and dry.     Findings: No erythema or rash.  Neurological:     Mental Status: He is alert and oriented to person, place, and time. Mental status is at baseline.  Psychiatric:        Behavior: Behavior normal.     Comments: Well groomed, good eye contact, normal speech and thoughts      Results for orders placed or performed in visit on 04/05/23  POCT HgB A1C  Result Value Ref Range   Hemoglobin A1C 6.2 (A) 4.0 - 5.6 %      Assessment & Plan:   Problem List Items Addressed This Visit     Benign hypertension with CKD (chronic kidney disease) stage III (HCC)    Controlled HTN - Home BP readings normal Complication with CKD-III stable on prior lab   Plan:  1. Continue current BP regimen - Metoprolol 25mg  BID, Losartan 25mg  daily - continue ARB with CKD 2. Encourage improved lifestyle - low sodium diet, regular exercise 3. Continue monitor BP outside office, bring readings to next visit, if persistently >140/90 or new symptoms notify office sooner      BPH with obstruction/lower urinary tract symptoms    Controlled on Tamsulosin      CKD (chronic kidney disease), stage III (HCC)    Stable CKD III setting of HTN, PreDM  Plan: 1. Continue to control BP, A1c  2. Continue to improve hydration 3. Avoid NSAIDs 4. FUTURE potential for referral to Nephrology 5. Follow-up 6 months lab      Major depression in remission (HCC)    Controlled in remission Continues SSRI Sertraline 50mg  Consider other med change if needed in future      Pre-diabetes - Primary    Improved A1c 6.2 Concern with obesity, HTN, HLD  Plan:  1. Not on any therapy currently - discuss options Metformin in future if indicated 2. Encourage improved lifestyle - low carb, low sugar diet, reduce portion size, continue improving regular exercise 3. Follow-up A1c 6 months       Relevant Orders   POCT HgB A1C (Completed)   Pulmonary emphysema (HCC)     Stable, without flare Followed by Dr Meredeth Ide [facility] Pulm Continues on Spiriva Albuterol PRN Remains smoke free      Other Visit Diagnoses     Constipation, unspecified constipation type       Relevant Medications   docusate sodium (COLACE) 100 MG  capsule       Meds ordered this encounter  Medications   docusate sodium (COLACE) 100 MG capsule    Sig: Take 1 capsule (100 mg total) by mouth 2 (two) times daily as needed for mild constipation.    Dispense:  60 capsule    Refill:  2     Follow up plan: Return in about 6 months (around 10/06/2023) for 6 month fasting lab only then 1 week later Annual Physical.  Future labs ordered for 10/07/23   Saralyn Pilar, DO [facility] 04/05/2023, 9:22 AM

## 2023-04-05 NOTE — Assessment & Plan Note (Signed)
Controlled on Tamsulosin 

## 2023-04-05 NOTE — Assessment & Plan Note (Signed)
Stable, without flare Followed by Dr Fleming [facility] Pulm Continues on Spiriva Albuterol PRN Remains smoke free 

## 2023-04-05 NOTE — Telephone Encounter (Signed)
Ordered Amitiza 24mg  daily to mail order  Saralyn Pilar, DO [facility] 04/05/2023, 5:00 PM

## 2023-04-05 NOTE — Addendum Note (Signed)
Addended by: Smitty Cords on: 04/05/2023 05:00 PM   Modules accepted: Orders

## 2023-04-05 NOTE — Assessment & Plan Note (Signed)
Stable CKD III setting of HTN, PreDM  Plan: 1. Continue to control BP, A1c  2. Continue to improve hydration 3. Avoid NSAIDs 4. FUTURE potential for referral to Nephrology 5. Follow-up 6 months lab

## 2023-04-05 NOTE — Telephone Encounter (Signed)
Copied from CRM [id number]. Topic: General - Other >> Apr 05, 2023 11:13 AM Everette C wrote: Reason for CRM: The patient's wife has called, as directed, to share that the patient has been previously prescribed Amitiza 24 MCG  Please contact further if needed

## 2023-04-19 ENCOUNTER — Ambulatory Visit (INDEPENDENT_AMBULATORY_CARE_PROVIDER_SITE_OTHER): Payer: Medicare PPO

## 2023-04-19 VITALS — Ht 67.75 in | Wt 236.0 lb

## 2023-04-19 DIAGNOSIS — J439 Emphysema, unspecified: Secondary | ICD-10-CM | POA: Diagnosis not present

## 2023-04-19 DIAGNOSIS — Z0000 Encounter for general adult medical examination without abnormal findings: Secondary | ICD-10-CM

## 2023-04-19 DIAGNOSIS — K219 Gastro-esophageal reflux disease without esophagitis: Secondary | ICD-10-CM | POA: Diagnosis not present

## 2023-04-19 NOTE — Patient Instructions (Signed)
George Jensen , Thank you for taking time to come for your Medicare Wellness Visit. I appreciate your ongoing commitment to your health goals. Please review the following plan we discussed and let me know if I can assist you in the future.   These are the goals we discussed:  Goals      DIET - EAT MORE FRUITS AND VEGETABLES     DIET - REDUCE SUGAR INTAKE     Recommend cutting out all sweets and junk food in diet to help aid in weight loss.      Increase water intake     Recommend drinking at least 5-6 glasses of water a day     Patient Stated     04/04/2021, no goals        This is a list of the screening recommended for you and due dates:  Health Maintenance  Topic Date Due   DTaP/Tdap/Td vaccine (1 - Tdap) Never done   Colon Cancer Screening  Never done   COVID-19 Vaccine (5 - 2023-24 season) 04/21/2023*   Zoster (Shingles) Vaccine (1 of 2) 07/06/2023*   Screening for Lung Cancer  04/04/2024*   Flu Shot  06/20/2023   Medicare Annual Wellness Visit  04/18/2024   Pneumonia Vaccine  Completed   Hepatitis C Screening  Completed   HPV Vaccine  Aged Out  *Topic was postponed. The date shown is not the original due date.    Advanced directives: no  Conditions/risks identified: none  Next appointment: Follow up in one year for your annual wellness visit. 04/24/24 @ 3:00 pm by phone  Preventive Care 65 Years and Older, Male  Preventive care refers to lifestyle choices and visits with your health care provider that can promote health and wellness. What does preventive care include? A yearly physical exam. This is also called an annual well check. Dental exams once or twice a year. Routine eye exams. Ask your health care provider how often you should have your eyes checked. Personal lifestyle choices, including: Daily care of your teeth and gums. Regular physical activity. Eating a healthy diet. Avoiding tobacco and drug use. Limiting alcohol use. Practicing safe sex. Taking  low doses of aspirin every day. Taking vitamin and mineral supplements as recommended by your health care provider. What happens during an annual well check? The services and screenings done by your health care provider during your annual well check will depend on your age, overall health, lifestyle risk factors, and family history of disease. Counseling  Your health care provider may ask you questions about your: Alcohol use. Tobacco use. Drug use. Emotional well-being. Home and relationship well-being. Sexual activity. Eating habits. History of falls. Memory and ability to understand (cognition). Work and work Astronomer. Screening  You may have the following tests or measurements: Height, weight, and BMI. Blood pressure. Lipid and cholesterol levels. These may be checked every 5 years, or more frequently if you are over 35 years old. Skin check. Lung cancer screening. You may have this screening every year starting at age 43 if you have a 30-pack-year history of smoking and currently smoke or have quit within the past 15 years. Fecal occult blood test (FOBT) of the stool. You may have this test every year starting at age 7. Flexible sigmoidoscopy or colonoscopy. You may have a sigmoidoscopy every 5 years or a colonoscopy every 10 years starting at age 75. Prostate cancer screening. Recommendations will vary depending on your family history and other risks. Hepatitis C blood  test. Hepatitis B blood test. Sexually transmitted disease (STD) testing. Diabetes screening. This is done by checking your blood sugar (glucose) after you have not eaten for a while (fasting). You may have this done every 1-3 years. Abdominal aortic aneurysm (AAA) screening. You may need this if you are a current or former smoker. Osteoporosis. You may be screened starting at age 56 if you are at high risk. Talk with your health care provider about your test results, treatment options, and if necessary, the  need for more tests. Vaccines  Your health care provider may recommend certain vaccines, such as: Influenza vaccine. This is recommended every year. Tetanus, diphtheria, and acellular pertussis (Tdap, Td) vaccine. You may need a Td booster every 10 years. Zoster vaccine. You may need this after age 77. Pneumococcal 13-valent conjugate (PCV13) vaccine. One dose is recommended after age 22. Pneumococcal polysaccharide (PPSV23) vaccine. One dose is recommended after age 82. Talk to your health care provider about which screenings and vaccines you need and how often you need them. This information is not intended to replace advice given to you by your health care provider. Make sure you discuss any questions you have with your health care provider. Document Released: 12/02/2015 Document Revised: 07/25/2016 Document Reviewed: 09/06/2015 Elsevier Interactive Patient Education  2017 [employer].  Fall Prevention in the Home Falls can cause injuries. They can happen to people of all ages. There are many things you can do to make your home safe and to help prevent falls. What can I do on the outside of my home? Regularly fix the edges of walkways and driveways and fix any cracks. Remove anything that might make you trip as you walk through a door, such as a raised step or threshold. Trim any bushes or trees on the path to your home. Use bright outdoor lighting. Clear any walking paths of anything that might make someone trip, such as rocks or tools. Regularly check to see if handrails are loose or broken. Make sure that both sides of any steps have handrails. Any raised decks and porches should have guardrails on the edges. Have any leaves, snow, or ice cleared regularly. Use sand or salt on walking paths during winter. Clean up any spills in your garage right away. This includes oil or grease spills. What can I do in the bathroom? Use night lights. Install grab bars by the toilet and in the tub  and shower. Do not use towel bars as grab bars. Use non-skid mats or decals in the tub or shower. If you need to sit down in the shower, use a plastic, non-slip stool. Keep the floor dry. Clean up any water that spills on the floor as soon as it happens. Remove soap buildup in the tub or shower regularly. Attach bath mats securely with double-sided non-slip rug tape. Do not have throw rugs and other things on the floor that can make you trip. What can I do in the bedroom? Use night lights. Make sure that you have a light by your bed that is easy to reach. Do not use any sheets or blankets that are too big for your bed. They should not hang down onto the floor. Have a firm chair that has side arms. You can use this for support while you get dressed. Do not have throw rugs and other things on the floor that can make you trip. What can I do in the kitchen? Clean up any spills right away. Avoid walking on wet  floors. Keep items that you use a lot in easy-to-reach places. If you need to reach something above you, use a strong step stool that has a grab bar. Keep electrical cords out of the way. Do not use floor polish or wax that makes floors slippery. If you must use wax, use non-skid floor wax. Do not have throw rugs and other things on the floor that can make you trip. What can I do with my stairs? Do not leave any items on the stairs. Make sure that there are handrails on both sides of the stairs and use them. Fix handrails that are broken or loose. Make sure that handrails are as long as the stairways. Check any carpeting to make sure that it is firmly attached to the stairs. Fix any carpet that is loose or worn. Avoid having throw rugs at the top or bottom of the stairs. If you do have throw rugs, attach them to the floor with carpet tape. Make sure that you have a light switch at the top of the stairs and the bottom of the stairs. If you do not have them, ask someone to add them for  you. What else can I do to help prevent falls? Wear shoes that: Do not have high heels. Have rubber bottoms. Are comfortable and fit you well. Are closed at the toe. Do not wear sandals. If you use a stepladder: Make sure that it is fully opened. Do not climb a closed stepladder. Make sure that both sides of the stepladder are locked into place. Ask someone to hold it for you, if possible. Clearly mark and make sure that you can see: Any grab bars or handrails. First and last steps. Where the edge of each step is. Use tools that help you move around (mobility aids) if they are needed. These include: Canes. Walkers. Scooters. Crutches. Turn on the lights when you go into a dark area. Replace any light bulbs as soon as they burn out. Set up your furniture so you have a clear path. Avoid moving your furniture around. If any of your floors are uneven, fix them. If there are any pets around you, be aware of where they are. Review your medicines with your doctor. Some medicines can make you feel dizzy. This can increase your chance of falling. Ask your doctor what other things that you can do to help prevent falls. This information is not intended to replace advice given to you by your health care provider. Make sure you discuss any questions you have with your health care provider. Document Released: 09/01/2009 Document Revised: 04/12/2016 Document Reviewed: 12/10/2014 Elsevier Interactive Patient Education  2017 [employer].

## 2023-04-19 NOTE — Progress Notes (Signed)
I connected with  Samuella Cota on 04/19/23 by a audio enabled telemedicine application and verified that I am speaking with the correct person using two identifiers.  Patient Location: Home  Provider Location: Office/Clinic  I discussed the limitations of evaluation and management by telemedicine. The patient expressed understanding and agreed to proceed.  Subjective:   George Jensen is a 77 y.o. male who presents for Medicare Annual/Subsequent preventive examination.  Review of Systems     Cardiac Risk Factors include: advanced age (>21men, >72 women);hypertension;male gender;dyslipidemia;obesity (BMI >30kg/m2)     Objective:    Today's Vitals   04/19/23 1539  Weight: 236 lb (107 kg)  Height: 5' 7.75" (1.721 m)   Body mass index is 36.15 kg/m.     04/19/2023    3:32 PM 04/04/2021    9:01 AM 03/29/2020    9:08 AM 10/07/2018   10:00 AM 06/11/2017   10:08 AM 09/16/2015    9:41 AM  Advanced Directives  Does Patient Have a Medical Advance Directive? No No No No Yes No  Type of Agricultural consultant;Living will   Copy of Healthcare Power of Attorney in Chart?     No - copy requested   Would patient like information on creating a medical advance directive? No - Patient declined  No - Patient declined   No - patient declined information    Current Medications (verified) Outpatient Encounter Medications as of 04/19/2023  Medication Sig   albuterol (PROVENTIL HFA;VENTOLIN HFA) 108 (90 BASE) MCG/ACT inhaler Inhale 2 puffs into the lungs every 4 (four) hours as needed for wheezing or shortness of breath (or shortness of breath).   AMITIZA 24 MCG capsule Take 1 capsule (24 mcg total) by mouth daily with breakfast.   aspirin 81 MG EC tablet TAKE 1 TABLET EVERY DAY   atorvastatin (LIPITOR) 80 MG tablet Take 1 tablet (80 mg total) by mouth at bedtime.   docusate sodium (COLACE) 100 MG capsule Take 1 capsule (100 mg total) by mouth 2 (two) times daily  as needed for mild constipation.   ezetimibe (ZETIA) 10 MG tablet Take 1 tablet (10 mg total) by mouth daily.   losartan (COZAAR) 25 MG tablet Take 1 tablet (25 mg total) by mouth daily.   metoprolol tartrate (LOPRESSOR) 25 MG tablet Take 1 tablet (25 mg total) by mouth 2 (two) times daily.   Multiple Vitamins-Minerals (MULTIVITAMIN MEN 50+) TABS Take daily by mouth.   nitroGLYCERIN (NITROSTAT) 0.4 MG SL tablet Place 0.4 mg under the tongue every 5 (five) minutes as needed for chest pain. AS NEEDED   omeprazole (PRILOSEC) 40 MG capsule Take 1 capsule (40 mg total) by mouth daily.   sertraline (ZOLOFT) 50 MG tablet Take 1 tablet (50 mg total) by mouth daily.   tamsulosin (FLOMAX) 0.4 MG CAPS capsule Take 1 capsule (0.4 mg total) by mouth daily after breakfast.   tiotropium (SPIRIVA) 18 MCG inhalation capsule Place 18 mcg into inhaler and inhale every other day.    No facility-administered encounter medications on file as of 04/19/2023.    Allergies (verified) Patient has no known allergies.   History: Past Medical History:  Diagnosis Date   BCC (basal cell carcinoma)    x 2 on face   COPD (chronic obstructive pulmonary disease) (HCC)    Hyperlipidemia    Hypertension    Melanoma (HCC)    Myocardial infarction (HCC) 10/2014   SCCA (squamous cell carcinoma) of  skin    SCC x 1 on face   Past Surgical History:  Procedure Laterality Date   CAROTID STENT  10/2014   3 STENTS [facility]   MELANOMA EXCISION  1982   Family History  Problem Relation Age of Onset   Alzheimer's disease Mother    Diabetes Father    Heart disease Father    Social History   Socioeconomic History   Marital status: Married    Spouse name: Aldean Kilmer   Number of children: 4   Years of education: Not on file   Highest education level: 9th grade  Occupational History   Occupation: Heavy Media planner  Tobacco Use   Smoking status: Former    Packs/day: 2    Types: Cigarettes    Quit date:  10/19/2014    Years since quitting: 8.5   Smokeless tobacco: Former  Building services engineer Use: Never used  Substance and Sexual Activity   Alcohol use: No   Drug use: No   Sexual activity: Not on file  Other Topics Concern   Not on file  Social History Narrative   Not on file   Social Determinants of Health   Financial Resource Strain: Low Risk  (04/19/2023)   Overall Financial Resource Strain (CARDIA)    Difficulty of Paying Living Expenses: Not hard at all  Food Insecurity: No Food Insecurity (04/19/2023)   Hunger Vital Sign    Worried About Running Out of Food in the Last Year: Never true    Ran Out of Food in the Last Year: Never true  Transportation Needs: No Transportation Needs (04/19/2023)   PRAPARE - Administrator, Civil Service (Medical): No    Lack of Transportation (Non-Medical): No  Physical Activity: Sufficiently Active (04/19/2023)   Exercise Vital Sign    Days of Exercise per Week: 7 days    Minutes of Exercise per Session: 60 min  Stress: No Stress Concern Present (04/19/2023)   [employer] of Occupational Health - Occupational Stress Questionnaire    Feeling of Stress : Not at all  Social Connections: Moderately Integrated (04/19/2023)   Social Connection and Isolation Panel [NHANES]    Frequency of Communication with Friends and Family: More than three times a week    Frequency of Social Gatherings with Friends and Family: Not on file    Attends Religious Services: Never    Database administrator or Organizations: Yes    Attends Banker Meetings: Never    Marital Status: Married    Tobacco Counseling Counseling given: Not Answered   Clinical Intake:  Pre-visit preparation completed: Yes  Pain : No/denies pain     Nutritional Risks: None Diabetes: No  How often do you need to have someone help you when you read instructions, pamphlets, or other written materials from your doctor or pharmacy?: 1 -  Never  Diabetic?no  Interpreter Needed?: No  Information entered by :: Kennedy Bucker, LPN   Activities of Daily Living    04/19/2023    3:34 PM 04/05/2023    9:35 AM  In your present state of health, do you have any difficulty performing the following activities:  Hearing? 0 0  Vision? 0 0  Difficulty concentrating or making decisions? 0 0  Walking or climbing stairs? 0 0  Dressing or bathing? 0 0  Doing errands, shopping? 0 0  Preparing Food and eating ? N   Using the Toilet? N   In the  past six months, have you accidently leaked urine? N   Do you have problems with loss of bowel control? N   Managing your Medications? N   Managing your Finances? N   Housekeeping or managing your Housekeeping? N     Patient Care Team: Smitty Cords, DO as PCP - General (Family Medicine) Mertie Moores, MD as Referring Physician (Specialist) Isla Pence, OD (Optometry) Jesusita Oka, MD (Dermatology) Marcina Millard, MD as Consulting Physician (Cardiology)  Indicate any recent Medical Services you may have received from other than [facility] providers in the past year (date may be approximate).     Assessment:   This is a routine wellness examination for Chrissie Noa.  Hearing/Vision screen Hearing Screening - Comments:: No aids Vision Screening - Comments:: Readers- cataract sgy- Dr. Clydene Pugh  Dietary issues and exercise activities discussed: Current Exercise Habits: The patient has a physically strenuous job, but has no regular exercise apart from work.   Goals Addressed             This Visit's Progress    DIET - EAT MORE FRUITS AND VEGETABLES         Depression Screen    04/19/2023    3:31 PM 04/05/2023    9:35 AM 10/05/2022    9:10 AM 04/12/2022    8:14 AM 04/12/2022    8:09 AM 10/06/2021    9:00 AM 10/06/2021    8:59 AM  PHQ 2/9 Scores  PHQ - 2 Score 0 0 0 0 0 0 0  PHQ- 9 Score 0  0 0 0 0     Fall Risk    04/19/2023    3:33 PM 04/05/2023     9:35 AM 10/05/2022    9:10 AM 04/12/2022    8:09 AM 04/04/2021    9:02 AM  Fall Risk   Falls in the past year? 0 0 0 0 0  Number falls in past yr: 0  0 0   Injury with Fall? 0  0 0   Risk for fall due to : No Fall Risks  No Fall Risks No Fall Risks Medication side effect  Follow up Falls prevention discussed;Falls evaluation completed  Falls evaluation completed Falls evaluation completed Falls evaluation completed;Education provided;Falls prevention discussed    FALL RISK PREVENTION PERTAINING TO THE HOME:  Any stairs in or around the home? Yes  If so, are there any without handrails? No  Home free of loose throw rugs in walkways, pet beds, electrical cords, etc? Yes  Adequate lighting in your home to reduce risk of falls? Yes   ASSISTIVE DEVICES UTILIZED TO PREVENT FALLS:  Life alert? No  Use of a cane, walker or w/c? No  Grab bars in the bathroom? Yes  Shower chair or bench in shower? Yes  Elevated toilet seat or a handicapped toilet? No    Cognitive Function:        04/19/2023    3:38 PM 04/12/2022    8:38 AM 04/04/2021    9:03 AM 10/07/2018   10:04 AM 06/11/2017   10:11 AM  6CIT Screen  What Year? 0 points 0 points 0 points 0 points 0 points  What month? 0 points 0 points 0 points 0 points 0 points  What time? 0 points 0 points 0 points 0 points 0 points  Count back from 20 0 points 0 points 0 points 0 points 0 points  Months in reverse 0 points 0 points 2 points 0 points 0 points  Repeat phrase 0 points 2 points 8 points 2 points 0 points  Total Score 0 points 2 points 10 points 2 points 0 points    Immunizations Immunization History  Administered Date(s) Administered   Fluad Quad(high Dose 65+) 09/25/2019, 09/29/2020, 10/06/2021, 10/05/2022   Influenza, High Dose Seasonal PF 08/12/2015, 09/06/2016, 09/20/2017, 10/07/2018   Moderna SARS-COV2 Booster Vaccination 05/05/2021   Moderna Sars-Covid-2 Vaccination 03/24/2020, 04/21/2020, 10/30/2020   Pneumococcal  Conjugate-13 08/12/2015, 09/06/2016   Pneumococcal Polysaccharide-23 09/27/2017    TDAP status: Due, Education has been provided regarding the importance of this vaccine. Advised may receive this vaccine at local pharmacy or Health Dept. Aware to provide a copy of the vaccination record if obtained from local pharmacy or Health Dept. Verbalized acceptance and understanding.  Flu Vaccine status: Up to date  Pneumococcal vaccine status: Up to date  Covid-19 vaccine status: Completed vaccines  Qualifies for Shingles Vaccine? Yes   Zostavax completed No   Shingrix Completed?: No.    Education has been provided regarding the importance of this vaccine. Patient has been advised to call insurance company to determine out of pocket expense if they have not yet received this vaccine. Advised may also receive vaccine at local pharmacy or Health Dept. Verbalized acceptance and understanding.  Screening Tests Health Maintenance  Topic Date Due   DTaP/Tdap/Td (1 - Tdap) Never done   Colonoscopy  Never done   COVID-19 Vaccine (5 - 2023-24 season) 04/21/2023 (Originally 07/20/2022)   Zoster Vaccines- Shingrix (1 of 2) 07/06/2023 (Originally 09/24/1998)   Lung Cancer Screening  04/04/2024 (Originally 09/24/1998)   INFLUENZA VACCINE  06/20/2023   Medicare Annual Wellness (AWV)  04/18/2024   Pneumonia Vaccine 85+ Years old  Completed   Hepatitis C Screening  Completed   HPV VACCINES  Aged Out    Health Maintenance  Health Maintenance Due  Topic Date Due   DTaP/Tdap/Td (1 - Tdap) Never done   Colonoscopy  Never done    Declined referral for colonoscopy  Lung Cancer Screening: (Low Dose CT Chest recommended if Age 82-80 years, 30 pack-year currently smoking OR have quit w/in 15years.) does not qualify.    Additional Screening:  Hepatitis C Screening: does qualify; Completed 04/08/19  Vision Screening: Recommended annual ophthalmology exams for early detection of glaucoma and other disorders of  the eye. Is the patient up to date with their annual eye exam?  Yes  Who is the provider or what is the name of the office in which the patient attends annual eye exams? Dr.Woodard If pt is not established with a provider, would they like to be referred to a provider to establish care? No .   Dental Screening: Recommended annual dental exams for proper oral hygiene  Community Resource Referral / Chronic Care Management: CRR required this visit?  No   CCM required this visit?  No      Plan:     I have personally reviewed and noted the following in the patient's chart:   Medical and social history Use of alcohol, tobacco or illicit drugs  Current medications and supplements including opioid prescriptions. Patient is not currently taking opioid prescriptions. Functional ability and status Nutritional status Physical activity Advanced directives List of other physicians Hospitalizations, surgeries, and ER visits in previous 12 months Vitals Screenings to include cognitive, depression, and falls Referrals and appointments  In addition, I have reviewed and discussed with patient certain preventive protocols, quality metrics, and best practice recommendations. A written personalized care plan for preventive  services as well as general preventive health recommendations were provided to patient.     Hal Hope, LPN   1/61/0960   Nurse Notes: none

## 2023-04-26 DIAGNOSIS — N1831 Chronic kidney disease, stage 3a: Secondary | ICD-10-CM | POA: Diagnosis not present

## 2023-04-26 DIAGNOSIS — J432 Centrilobular emphysema: Secondary | ICD-10-CM | POA: Diagnosis not present

## 2023-04-26 DIAGNOSIS — E785 Hyperlipidemia, unspecified: Secondary | ICD-10-CM | POA: Diagnosis not present

## 2023-04-26 DIAGNOSIS — I129 Hypertensive chronic kidney disease with stage 1 through stage 4 chronic kidney disease, or unspecified chronic kidney disease: Secondary | ICD-10-CM | POA: Diagnosis not present

## 2023-04-26 DIAGNOSIS — I2510 Atherosclerotic heart disease of native coronary artery without angina pectoris: Secondary | ICD-10-CM | POA: Diagnosis not present

## 2023-04-26 DIAGNOSIS — N183 Chronic kidney disease, stage 3 unspecified: Secondary | ICD-10-CM | POA: Diagnosis not present

## 2023-04-26 DIAGNOSIS — I252 Old myocardial infarction: Secondary | ICD-10-CM | POA: Diagnosis not present

## 2023-04-26 DIAGNOSIS — I2111 ST elevation (STEMI) myocardial infarction involving right coronary artery: Secondary | ICD-10-CM | POA: Diagnosis not present

## 2023-06-18 ENCOUNTER — Other Ambulatory Visit: Payer: Self-pay | Admitting: Family Medicine

## 2023-06-18 DIAGNOSIS — K219 Gastro-esophageal reflux disease without esophagitis: Secondary | ICD-10-CM

## 2023-06-19 NOTE — Telephone Encounter (Signed)
Requested Prescriptions  Pending Prescriptions Disp Refills   omeprazole (PRILOSEC) 40 MG capsule [Pharmacy Med Name: OMEPRAZOLE 40 MG Capsule Delayed Release] 90 capsule 1    Sig: TAKE 1 CAPSULE EVERY DAY     Gastroenterology: Proton Pump Inhibitors Passed - 06/18/2023  3:47 PM      Passed - Valid encounter within last 12 months    Recent Outpatient Visits           2 months ago Pre-diabetes   [place] [facility], Netta Neat, DO   8 months ago Annual physical exam   [facility] [facility] Smitty Cords, DO   10 months ago Acute cystitis without hematuria   [place] [facility] Smitty Cords, DO   1 year ago Acute cystitis with hematuria   [place] [facility] Smitty Cords, DO   1 year ago Annual physical exam   [place] [facility] Smitty Cords, DO       Future Appointments             In 3 months Althea Charon, Netta Neat, DO [place] [facility], [facility]

## 2023-08-31 ENCOUNTER — Other Ambulatory Visit: Payer: Self-pay | Admitting: Family Medicine

## 2023-08-31 DIAGNOSIS — N401 Enlarged prostate with lower urinary tract symptoms: Secondary | ICD-10-CM

## 2023-09-02 NOTE — Telephone Encounter (Signed)
Requested Prescriptions  Pending Prescriptions Disp Refills   tamsulosin (FLOMAX) 0.4 MG CAPS capsule [Pharmacy Med Name: Tamsulosin HCl Oral Capsule 0.4 MG] 90 capsule 0    Sig: TAKE 1 CAPSULE EVERY DAY AFTER BREAKFAST     Urology: Alpha-Adrenergic Blocker Passed - 08/31/2023  5:09 AM      Passed - PSA in normal range and within 360 days    PSA  Date Value Ref Range Status  09/28/2022 0.86 < OR = 4.00 ng/mL Final    Comment:    The total PSA value from this assay system is  standardized against the WHO standard. The test  result will be approximately 20% lower when compared  to the equimolar-standardized total PSA (Beckman  Coulter). Comparison of serial PSA results should be  interpreted with this fact in mind. . This test was performed using the Siemens  chemiluminescent method. Values obtained from  different assay methods cannot be used interchangeably. PSA levels, regardless of value, should not be interpreted as absolute evidence of the presence or absence of disease.          Passed - Last BP in normal range    BP Readings from Last 1 Encounters:  04/05/23 120/68         Passed - Valid encounter within last 12 months    Recent Outpatient Visits           5 months ago Pre-diabetes   [place] [facility] Smitty Cords, [place]   11 months ago Annual physical exam   [place] [facility] Smitty Cords, DO   1 year ago Acute cystitis without hematuria   [place] [facility] Smitty Cords, DO   1 year ago Acute cystitis with hematuria   [place] [facility] Smitty Cords, DO   1 year ago Annual physical exam   [place] [facility] Smitty Cords, DO       Future Appointments             In 1 month Althea Charon, Netta Neat, DO [facility] [facility], [facility]

## 2023-09-13 DIAGNOSIS — Z961 Presence of intraocular lens: Secondary | ICD-10-CM | POA: Diagnosis not present

## 2023-09-13 DIAGNOSIS — H43392 Other vitreous opacities, left eye: Secondary | ICD-10-CM | POA: Diagnosis not present

## 2023-09-13 DIAGNOSIS — H33191 Other retinoschisis and retinal cysts, right eye: Secondary | ICD-10-CM | POA: Diagnosis not present

## 2023-09-13 DIAGNOSIS — H35363 Drusen (degenerative) of macula, bilateral: Secondary | ICD-10-CM | POA: Diagnosis not present

## 2023-09-13 DIAGNOSIS — E119 Type 2 diabetes mellitus without complications: Secondary | ICD-10-CM | POA: Diagnosis not present

## 2023-09-13 LAB — HM DIABETES EYE EXAM

## 2023-09-17 ENCOUNTER — Encounter: Payer: Self-pay | Admitting: Family Medicine

## 2023-10-07 ENCOUNTER — Other Ambulatory Visit: Payer: Medicare PPO

## 2023-10-07 DIAGNOSIS — N1831 Chronic kidney disease, stage 3a: Secondary | ICD-10-CM | POA: Diagnosis not present

## 2023-10-07 DIAGNOSIS — E785 Hyperlipidemia, unspecified: Secondary | ICD-10-CM

## 2023-10-07 DIAGNOSIS — N138 Other obstructive and reflux uropathy: Secondary | ICD-10-CM | POA: Diagnosis not present

## 2023-10-07 DIAGNOSIS — N183 Chronic kidney disease, stage 3 unspecified: Secondary | ICD-10-CM

## 2023-10-07 DIAGNOSIS — R7303 Prediabetes: Secondary | ICD-10-CM

## 2023-10-07 DIAGNOSIS — N401 Enlarged prostate with lower urinary tract symptoms: Secondary | ICD-10-CM | POA: Diagnosis not present

## 2023-10-07 DIAGNOSIS — Z0000 Encounter for general adult medical examination without abnormal findings: Secondary | ICD-10-CM

## 2023-10-07 DIAGNOSIS — I129 Hypertensive chronic kidney disease with stage 1 through stage 4 chronic kidney disease, or unspecified chronic kidney disease: Secondary | ICD-10-CM | POA: Diagnosis not present

## 2023-10-08 LAB — LIPID PANEL
Cholesterol: 115 mg/dL (ref <200)
HDL: 41 mg/dL (ref >=40)
LDL Cholesterol (Calc): 58 mg/dL
Non-HDL Cholesterol (Calc): 74 mg/dL (ref <130)
Total CHOL/HDL Ratio: 2.8 (calc) (ref <5.0)
Triglycerides: 81 mg/dL (ref <150)

## 2023-10-08 LAB — HEMOGLOBIN A1C
Hgb A1c MFr Bld: 6.9 %{Hb} — ABNORMAL HIGH (ref <5.7)
Mean Plasma Glucose: 151 mg/dL
eAG (mmol/L): 8.4 mmol/L

## 2023-10-08 LAB — CBC WITH DIFFERENTIAL/PLATELET
Absolute Lymphocytes: 1711 {cells}/uL (ref 850–3900)
Absolute Monocytes: 736 {cells}/uL (ref 200–950)
Basophils Absolute: 37 {cells}/uL (ref 0–200)
Basophils Relative: 0.4 %
Eosinophils Absolute: 460 {cells}/uL (ref 15–500)
Eosinophils Relative: 5 %
HCT: 42.2 % (ref 38.5–50.0)
Hemoglobin: 14 g/dL (ref 13.2–17.1)
MCH: 30.8 pg (ref 27.0–33.0)
MCHC: 33.2 g/dL (ref 32.0–36.0)
MCV: 92.7 fL (ref 80.0–100.0)
MPV: 10.4 fL (ref 7.5–12.5)
Monocytes Relative: 8 %
Neutro Abs: 6256 {cells}/uL (ref 1500–7800)
Neutrophils Relative %: 68 %
Platelets: 158 10*3/uL (ref 140–400)
RBC: 4.55 10*6/uL (ref 4.20–5.80)
RDW: 12.6 % (ref 11.0–15.0)
Total Lymphocyte: 18.6 %
WBC: 9.2 10*3/uL (ref 3.8–10.8)

## 2023-10-08 LAB — COMPLETE METABOLIC PANEL WITH GFR
AG Ratio: 1.4 (calc) (ref 1.0–2.5)
ALT: 17 U/L (ref 9–46)
AST: 21 U/L (ref 10–35)
Albumin: 3.9 g/dL (ref 3.6–5.1)
Alkaline phosphatase (APISO): 80 U/L (ref 35–144)
BUN/Creatinine Ratio: 14 (calc) (ref 6–22)
BUN: 24 mg/dL (ref 7–25)
CO2: 27 mmol/L (ref 20–32)
Calcium: 9.1 mg/dL (ref 8.6–10.3)
Chloride: 109 mmol/L (ref 98–110)
Creat: 1.75 mg/dL — ABNORMAL HIGH (ref 0.70–1.28)
Globulin: 2.7 g/dL (ref 1.9–3.7)
Glucose, Bld: 108 mg/dL — ABNORMAL HIGH (ref 65–99)
Potassium: 5 mmol/L (ref 3.5–5.3)
Sodium: 144 mmol/L (ref 135–146)
Total Bilirubin: 0.5 mg/dL (ref 0.2–1.2)
Total Protein: 6.6 g/dL (ref 6.1–8.1)
eGFR: 40 mL/min/{1.73_m2} — ABNORMAL LOW (ref >=60)

## 2023-10-08 LAB — TSH: TSH: 2.5 m[IU]/L (ref 0.40–4.50)

## 2023-10-08 LAB — PSA: PSA: 1.37 ng/mL (ref <=4.00)

## 2023-10-15 ENCOUNTER — Other Ambulatory Visit: Payer: Self-pay | Admitting: Family Medicine

## 2023-10-15 ENCOUNTER — Ambulatory Visit (INDEPENDENT_AMBULATORY_CARE_PROVIDER_SITE_OTHER): Payer: Medicare PPO | Admitting: Family Medicine

## 2023-10-15 ENCOUNTER — Encounter: Payer: Self-pay | Admitting: Family Medicine

## 2023-10-15 VITALS — BP 100/60 | HR 57 | Ht 71.0 in | Wt 236.2 lb

## 2023-10-15 DIAGNOSIS — E785 Hyperlipidemia, unspecified: Secondary | ICD-10-CM

## 2023-10-15 DIAGNOSIS — K5900 Constipation, unspecified: Secondary | ICD-10-CM

## 2023-10-15 DIAGNOSIS — N401 Enlarged prostate with lower urinary tract symptoms: Secondary | ICD-10-CM

## 2023-10-15 DIAGNOSIS — Z0000 Encounter for general adult medical examination without abnormal findings: Secondary | ICD-10-CM

## 2023-10-15 DIAGNOSIS — J431 Panlobular emphysema: Secondary | ICD-10-CM

## 2023-10-15 DIAGNOSIS — F325 Major depressive disorder, single episode, in full remission: Secondary | ICD-10-CM | POA: Diagnosis not present

## 2023-10-15 DIAGNOSIS — K219 Gastro-esophageal reflux disease without esophagitis: Secondary | ICD-10-CM

## 2023-10-15 DIAGNOSIS — R7303 Prediabetes: Secondary | ICD-10-CM

## 2023-10-15 DIAGNOSIS — N1831 Chronic kidney disease, stage 3a: Secondary | ICD-10-CM

## 2023-10-15 DIAGNOSIS — I129 Hypertensive chronic kidney disease with stage 1 through stage 4 chronic kidney disease, or unspecified chronic kidney disease: Secondary | ICD-10-CM

## 2023-10-15 DIAGNOSIS — N138 Other obstructive and reflux uropathy: Secondary | ICD-10-CM

## 2023-10-15 DIAGNOSIS — Z23 Encounter for immunization: Secondary | ICD-10-CM | POA: Diagnosis not present

## 2023-10-15 DIAGNOSIS — I2510 Atherosclerotic heart disease of native coronary artery without angina pectoris: Secondary | ICD-10-CM

## 2023-10-15 DIAGNOSIS — N183 Chronic kidney disease, stage 3 unspecified: Secondary | ICD-10-CM

## 2023-10-15 MED ORDER — LOSARTAN POTASSIUM 25 MG PO TABS: 25.0000 mg | ORAL_TABLET | Freq: Every day | ORAL | 3 refills | Status: DC

## 2023-10-15 MED ORDER — ATORVASTATIN CALCIUM 80 MG PO TABS: 80.0000 mg | ORAL_TABLET | Freq: Every day | ORAL | 3 refills | Status: DC

## 2023-10-15 MED ORDER — EZETIMIBE 10 MG PO TABS: 10.0000 mg | ORAL_TABLET | Freq: Every day | ORAL | 3 refills | Status: DC

## 2023-10-15 MED ORDER — METOPROLOL TARTRATE 25 MG PO TABS: 25.0000 mg | ORAL_TABLET | Freq: Two times a day (BID) | ORAL | 3 refills | Status: DC

## 2023-10-15 MED ORDER — OMEPRAZOLE 40 MG PO CPDR: 40.0000 mg | DELAYED_RELEASE_CAPSULE | Freq: Every day | ORAL | 3 refills | Status: DC

## 2023-10-15 MED ORDER — AMITIZA 24 MCG PO CAPS: 24.0000 ug | ORAL_CAPSULE | Freq: Every day | ORAL | 3 refills | Status: DC

## 2023-10-15 MED ORDER — SERTRALINE HCL 50 MG PO TABS: 50.0000 mg | ORAL_TABLET | Freq: Every day | ORAL | 3 refills | Status: DC

## 2023-10-15 NOTE — Patient Instructions (Addendum)
Thank you for coming to the office today.  Elevated Creatinine, and reduced kidney function.  Goal to improve hydration more water, less soda  Limit sugars, to avoid progression to diabetes.  Recent Labs    04/05/23 0923 10/07/23 0752  HGBA1C 6.2* 6.9*   Limit carb starches sugars. No medication today.  If kidneys are still similar, I will plan to refer to Nephrology next time.  DUE for FASTING BLOOD WORK (no food or drink after midnight before the lab appointment, only water or coffee without cream/sugar on the morning of)  SCHEDULE "Lab Only" visit in the morning at the clinic for lab draw in 5 MONTHS   - Make sure Lab Only appointment is at about 1 week before your next appointment, so that results will be available  For Lab Results, once available within 2-3 days of blood draw, you can can log in to MyChart online to view your results and a brief explanation. Also, we can discuss results at next follow-up visit.    Please schedule a Follow-up Appointment to: Return in about 6 months (around 04/13/2024) for 1 year fasting lab > 1 week later Annual Physical.  If you have any other questions or concerns, please feel free to call the office or send a message through MyChart. You may also schedule an earlier appointment if necessary.  Additionally, you may be receiving a survey about your experience at our office within a few days to 1 week by e-mail or mail. We value your feedback.  Saralyn Pilar, DO [facility], [place]

## 2023-10-15 NOTE — Assessment & Plan Note (Signed)
Controlled in remission Continues SSRI Sertraline 50mg  Consider other med change if needed in future

## 2023-10-15 NOTE — Assessment & Plan Note (Signed)
Controlled on lab

## 2023-10-15 NOTE — Assessment & Plan Note (Signed)
Controlled HYPERTENSION / low reading today - Home BP readings normal Complication with CKD-III elevated Cr now with reduced eGFR   Plan:  1. Continue current BP regimen - Metoprolol 25mg  BID, Losartan 25mg  daily - continue ARB with CKD 2. Encourage improved lifestyle - low sodium diet, regular exercise 3. Continue monitor BP outside office, bring readings to next visit, if persistently >140/90 or new symptoms notify office sooner  Discussion about CKD and goal to improve hydration, limit soda, improve sugar/HYPERTENSION and we can pursue referral to Nephrology at next visit if indicated if still reduced GFR

## 2023-10-15 NOTE — Assessment & Plan Note (Signed)
Stable, medically managed, s/p RCA STEMI and PCI Followed by [facility] On ASA, high intensity statin, ARB, BB With LDL goal < 70

## 2023-10-15 NOTE — Progress Notes (Signed)
Subjective:    Patient ID: George Jensen, male    DOB: 03-02-1946, 77 y.o.   MRN: [id number]  George Jensen is a 77 y.o. male presenting on 10/15/2023 for Annual Exam   HPI  Discussed the use of AI scribe software for clinical note transcription with the patient, who gave verbal consent to proceed.    Pre-Diabetes: Last A1c 6.9 now, at risk of type 2 diabetes, previously had improved. Attributed to inc sweet intake CBGs: Does not check CBG Meds: No medication (never on med) Currently on ARB Lifestyle: - Diet (states not adhering to low carb sweet diet, goal to improve) - Exercise (working outdoors, when weather nice, but not always exercising with walking) - Taking ASA 81mg , Statin Denies hypoglycemia, polyuria, visual changes, numbness or tingling.   CHRONIC HTN / CKD-III Known chronic CKD-III - last lab with elevated Cr and reduced GFR Concern with now elevated glucose see above Current Meds - Metoprolol 25mg  BID, Losartan 25mg  Reports good compliance, took meds today. Tolerating well, w/o complaints. - Tries to stay well hydrated with water. Admits had eaten many bananas lately Denies CP, dyspnea, HA, edema, dizziness / lightheadedness    Panlobular Emphysema COPD, Former Smoker Followed by Dr Meredeth Ide, good report recently with doing very well on PFTs On Spiriva - Rarely using Albuterol PRN   HYPERLIPIDEMIA: - Reports no concerns. Last lipid panel 09/2023, controlled lipids on medicines LDL 58 - Currently taking Zetia and Atorvastatin, tolerating well without side effects or myalgias   Major Depression in Remission No new concerns, see PHQ On Sertraline 50mg  daily    BPH LUTS No longer able to tolerate Tamsulosin for BPH symptoms. Due to side effects. Off medication, had dizziness - PSA at 1.37 prior 0.86  Health Maintenance  Flu shot today Prevnar20 vaccine update today     10/15/2023    1:39 PM 04/19/2023    3:31 PM 04/05/2023    9:35 AM   Depression screen PHQ 2/9  Decreased Interest 0 0 0  Down, Depressed, Hopeless 0 0 0  PHQ - 2 Score 0 0 0  Altered sleeping 0 0   Tired, decreased energy 0 0   Change in appetite 0 0   Feeling bad or failure about yourself  0 0   Trouble concentrating 0 0   Moving slowly or fidgety/restless 0 0   Suicidal thoughts 0 0   PHQ-9 Score 0 0   Difficult doing work/chores Not difficult at all Not difficult at all        10/15/2023    1:40 PM 04/05/2023    9:35 AM 10/05/2022    9:10 AM 03/31/2021    8:29 AM  GAD 7 : Generalized Anxiety Score  Nervous, Anxious, on Edge 0 0 0 0  Control/stop worrying 0 0 0 0  Worry too much - different things 0 0 0 0  Trouble relaxing 0 0 0 0  Restless 0 0 0 0  Easily annoyed or irritable 0 0 0 0  Afraid - awful might happen 0 0 0 0  Total GAD 7 Score 0 0 0 0  Anxiety Difficulty Not difficult at all  Not difficult at all      Past Medical History:  Diagnosis Date   BCC (basal cell carcinoma)    x 2 on face   COPD (chronic obstructive pulmonary disease) (HCC)    Hyperlipidemia    Hypertension    Melanoma (HCC)    Myocardial infarction (  HCC) 10/2014   SCCA (squamous cell carcinoma) of skin    SCC x 1 on face   Past Surgical History:  Procedure Laterality Date   CAROTID STENT  10/2014   3 STENTS [facility]   MELANOMA EXCISION  1982   Social History   Socioeconomic History   Marital status: Married    Spouse name: George Jensen   Number of children: 4   Years of education: Not on file   Highest education level: 9th grade  Occupational History   Occupation: Heavy Media planner  Tobacco Use   Smoking status: Former    Current packs/day: 0.00    Types: Cigarettes    Quit date: 10/19/2014    Years since quitting: 8.9   Smokeless tobacco: Former  Building services engineer status: Never Used  Substance and Sexual Activity   Alcohol use: No   Drug use: No   Sexual activity: Not on file  Other Topics Concern   Not on file  Social  History Narrative   Not on file   Social Determinants of Health   Financial Resource Strain: Low Risk  (04/19/2023)   Overall Financial Resource Strain (CARDIA)    Difficulty of Paying Living Expenses: Not hard at all  Food Insecurity: No Food Insecurity (04/19/2023)   Hunger Vital Sign    Worried About Running Out of Food in the Last Year: Never true    Ran Out of Food in the Last Year: Never true  Transportation Needs: No Transportation Needs (04/19/2023)   PRAPARE - Administrator, Civil Service (Medical): No    Lack of Transportation (Non-Medical): No  Physical Activity: Sufficiently Active (04/19/2023)   Exercise Vital Sign    Days of Exercise per Week: 7 days    Minutes of Exercise per Session: 60 min  Stress: No Stress Concern Present (04/19/2023)   [employer] of Occupational Health - Occupational Stress Questionnaire    Feeling of Stress : Not at all  Social Connections: Moderately Integrated (04/19/2023)   Social Connection and Isolation Panel [NHANES]    Frequency of Communication with Friends and Family: More than three times a week    Frequency of Social Gatherings with Friends and Family: Not on file    Attends Religious Services: Never    Active Member of Clubs or Organizations: Yes    Attends Banker Meetings: Never    Marital Status: Married  Catering manager Violence: Not At Risk (04/19/2023)   Humiliation, Afraid, Rape, and Kick questionnaire    Fear of Current or Ex-Partner: No    Emotionally Abused: No    Physically Abused: No    Sexually Abused: No   Family History  Problem Relation Age of Onset   Alzheimer's disease Mother    Diabetes Father    Heart disease Father    Current Outpatient Medications on File Prior to Visit  Medication Sig   albuterol (PROVENTIL HFA;VENTOLIN HFA) 108 (90 BASE) MCG/ACT inhaler Inhale 2 puffs into the lungs every 4 (four) hours as needed for wheezing or shortness of breath (or shortness of  breath).   aspirin 81 MG EC tablet TAKE 1 TABLET EVERY DAY   docusate sodium (COLACE) 100 MG capsule Take 1 capsule (100 mg total) by mouth 2 (two) times daily as needed for mild constipation.   Multiple Vitamins-Minerals (MULTIVITAMIN MEN 50+) TABS Take daily by mouth.   nitroGLYCERIN (NITROSTAT) 0.4 MG SL tablet Place 0.4 mg under the tongue every  5 (five) minutes as needed for chest pain. AS NEEDED   tiotropium (SPIRIVA) 18 MCG inhalation capsule Place 18 mcg into inhaler and inhale every other day.    No current facility-administered medications on file prior to visit.    Review of Systems  Constitutional:  Negative for activity change, appetite change, chills, diaphoresis, fatigue and fever.  HENT:  Negative for congestion and hearing loss.   Eyes:  Negative for visual disturbance.  Respiratory:  Negative for cough, chest tightness, shortness of breath and wheezing.   Cardiovascular:  Negative for chest pain, palpitations and leg swelling.  Gastrointestinal:  Negative for abdominal pain, constipation, diarrhea, nausea and vomiting.  Genitourinary:  Negative for dysuria, frequency and hematuria.  Musculoskeletal:  Negative for arthralgias and neck pain.  Skin:  Negative for rash.  Neurological:  Negative for dizziness, weakness, light-headedness, numbness and headaches.  Hematological:  Negative for adenopathy.  Psychiatric/Behavioral:  Negative for behavioral problems, dysphoric mood and sleep disturbance.    Per HPI unless specifically indicated above     Objective:    BP 100/60 (BP Location: Left Arm, Cuff Size: Normal)   Pulse (!) 57   Ht 5\' 11"  (1.803 m)   Wt 236 lb 3.2 oz (107.1 kg)   BMI 32.94 kg/m   Wt Readings from Last 3 Encounters:  10/15/23 236 lb 3.2 oz (107.1 kg)  04/19/23 236 lb (107 kg)  04/05/23 236 lb (107 kg)    Physical Exam Vitals and nursing note reviewed.  Constitutional:      General: He is not in acute distress.    Appearance: He is  well-developed. He is not diaphoretic.     Comments: Well-appearing, comfortable, cooperative  HENT:     Head: Normocephalic and atraumatic.  Eyes:     General:        Right eye: No discharge.        Left eye: No discharge.     Conjunctiva/sclera: Conjunctivae normal.     Pupils: Pupils are equal, round, and reactive to light.  Neck:     Thyroid: No thyromegaly.     Vascular: No carotid bruit.  Cardiovascular:     Rate and Rhythm: Normal rate and regular rhythm.     Pulses: Normal pulses.     Heart sounds: Normal heart sounds. No murmur heard. Pulmonary:     Effort: Pulmonary effort is normal. No respiratory distress.     Breath sounds: Normal breath sounds. No wheezing or rales.  Abdominal:     General: Bowel sounds are normal. There is no distension.     Palpations: Abdomen is soft. There is no mass.     Tenderness: There is no abdominal tenderness.  Musculoskeletal:        General: No tenderness. Normal range of motion.     Cervical back: Normal range of motion and neck supple.     Right lower leg: No edema.     Left lower leg: No edema.     Comments: Upper / Lower Extremities: - Normal muscle tone, strength bilateral upper extremities 5/5, lower extremities 5/5  Lymphadenopathy:     Cervical: No cervical adenopathy.  Skin:    General: Skin is warm and dry.     Findings: No erythema or rash.  Neurological:     Mental Status: He is alert and oriented to person, place, and time.     Comments: Distal sensation intact to light touch all extremities  Psychiatric:  Mood and Affect: Mood normal.        Behavior: Behavior normal.        Thought Content: Thought content normal.     Comments: Well groomed, good eye contact, normal speech and thoughts     Results for orders placed or performed in visit on 10/07/23  TSH  Result Value Ref Range   TSH 2.50 0.40 - 4.50 mIU/L  PSA  Result Value Ref Range   PSA 1.37 < OR = 4.00 ng/mL  Lipid panel  Result Value Ref Range    Cholesterol 115 <200 mg/dL   HDL 41 > OR = 40 mg/dL   Triglycerides 81 <161 mg/dL   LDL Cholesterol (Calc) 58 mg/dL (calc)   Total CHOL/HDL Ratio 2.8 <5.0 (calc)   Non-HDL Cholesterol (Calc) 74 <096 mg/dL (calc)  Hemoglobin E4V  Result Value Ref Range   Hgb A1c MFr Bld 6.9 (H) <5.7 % of total Hgb   Mean Plasma Glucose 151 mg/dL   eAG (mmol/L) 8.4 mmol/L  CBC with Differential/Platelet  Result Value Ref Range   WBC 9.2 3.8 - 10.8 Thousand/uL   RBC 4.55 4.20 - 5.80 Million/uL   Hemoglobin 14.0 13.2 - 17.1 g/dL   HCT 40.9 81.1 - 91.4 %   MCV 92.7 80.0 - 100.0 fL   MCH 30.8 27.0 - 33.0 pg   MCHC 33.2 32.0 - 36.0 g/dL   RDW 78.2 95.6 - 21.3 %   Platelets 158 140 - 400 Thousand/uL   MPV 10.4 7.5 - 12.5 fL   Neutro Abs 6,256 1,500 - 7,800 cells/uL   Absolute Lymphocytes 1,711 850 - 3,900 cells/uL   Absolute Monocytes 736 200 - 950 cells/uL   Eosinophils Absolute 460 15 - 500 cells/uL   Basophils Absolute 37 0 - 200 cells/uL   Neutrophils Relative % 68 %   Total Lymphocyte 18.6 %   Monocytes Relative 8.0 %   Eosinophils Relative 5.0 %   Basophils Relative 0.4 %  COMPLETE METABOLIC PANEL WITH GFR  Result Value Ref Range   Glucose, Bld 108 (H) 65 - 99 mg/dL   BUN 24 7 - 25 mg/dL   Creat 0.86 (H) 5.78 - 1.28 mg/dL   eGFR 40 (L) > OR = 60 mL/min/1.20m2   BUN/Creatinine Ratio 14 6 - 22 (calc)   Sodium 144 135 - 146 mmol/L   Potassium 5.0 3.5 - 5.3 mmol/L   Chloride 109 98 - 110 mmol/L   CO2 27 20 - 32 mmol/L   Calcium 9.1 8.6 - 10.3 mg/dL   Total Protein 6.6 6.1 - 8.1 g/dL   Albumin 3.9 3.6 - 5.1 g/dL   Globulin 2.7 1.9 - 3.7 g/dL (calc)   AG Ratio 1.4 1.0 - 2.5 (calc)   Total Bilirubin 0.5 0.2 - 1.2 mg/dL   Alkaline phosphatase (APISO) 80 35 - 144 U/L   AST 21 10 - 35 U/L   ALT 17 9 - 46 U/L      Assessment & Plan:   Problem List Items Addressed This Visit     Benign hypertension with CKD (chronic kidney disease) stage III (HCC)    Controlled HYPERTENSION / low  reading today - Home BP readings normal Complication with CKD-III elevated Cr now with reduced eGFR   Plan:  1. Continue current BP regimen - Metoprolol 25mg  BID, Losartan 25mg  daily - continue ARB with CKD 2. Encourage improved lifestyle - low sodium diet, regular exercise 3. Continue monitor BP outside office, bring readings  to next visit, if persistently >140/90 or new symptoms notify office sooner  Discussion about CKD and goal to improve hydration, limit soda, improve sugar/HYPERTENSION and we can pursue referral to Nephrology at next visit if indicated if still reduced GFR      Relevant Medications   metoprolol tartrate (LOPRESSOR) 25 MG tablet   losartan (COZAAR) 25 MG tablet   ezetimibe (ZETIA) 10 MG tablet   atorvastatin (LIPITOR) 80 MG tablet   BPH with obstruction/lower urinary tract symptoms    BPH LUTS Now off alpha blocker tamsulosin due to side effects Defer other management at this time      CAD, multiple vessel    Stable, medically managed, s/p RCA STEMI and PCI Followed by [facility] On ASA, high intensity statin, ARB, BB With LDL goal < 70      Relevant Medications   metoprolol tartrate (LOPRESSOR) 25 MG tablet   losartan (COZAAR) 25 MG tablet   ezetimibe (ZETIA) 10 MG tablet   atorvastatin (LIPITOR) 80 MG tablet   Dyslipidemia    Controlled on lab      Relevant Medications   ezetimibe (ZETIA) 10 MG tablet   atorvastatin (LIPITOR) 80 MG tablet   GERD (gastroesophageal reflux disease)   Relevant Medications   omeprazole (PRILOSEC) 40 MG capsule   AMITIZA 24 MCG capsule   Major depression in remission (HCC)    Controlled in remission Continues SSRI Sertraline 50mg  Consider other med change if needed in future      Relevant Medications   sertraline (ZOLOFT) 50 MG tablet   Pre-diabetes    Elevated A1c to 6.9 at risk of progression to Type 2 Diabetes Concern w obesity HYPERTENSION CKD HLD  Discussion consider meds next visit if still  elevated.      Pulmonary emphysema (HCC)    Stable, without flare Followed by Dr Meredeth Ide [facility] Pulm Continues on Spiriva Albuterol PRN Remains smoke free      Other Visit Diagnoses     Annual physical exam    -  Primary   Needs flu shot       Relevant Orders   Flu Vaccine Trivalent High Dose (Fluad) (Completed)   Need for Streptococcus pneumoniae vaccination       Relevant Orders   Pneumococcal conjugate vaccine 20-valent (Completed)   Constipation, unspecified constipation type       Relevant Medications   AMITIZA 24 MCG capsule        Updated Health Maintenance information Reviewed recent lab results with patient Encouraged improvement to lifestyle with diet and exercise Goal of weight loss    Immunizations Updated pneumonia vaccine (Prevnar 20) and annual flu shot due. -Administer Prevnar 20 and flu shot today.  Follow-up in 5 months (April 2025) for lab results and further management.        Orders Placed This Encounter  Procedures   Flu Vaccine Trivalent High Dose (Fluad)   Pneumococcal conjugate vaccine 20-valent    Meds ordered this encounter  Medications   sertraline (ZOLOFT) 50 MG tablet    Sig: Take 1 tablet (50 mg total) by mouth daily.    Dispense:  90 tablet    Refill:  3   omeprazole (PRILOSEC) 40 MG capsule    Sig: Take 1 capsule (40 mg total) by mouth daily.    Dispense:  90 capsule    Refill:  3   metoprolol tartrate (LOPRESSOR) 25 MG tablet    Sig: Take 1 tablet (25 mg total) by mouth  2 (two) times daily.    Dispense:  180 tablet    Refill:  3   losartan (COZAAR) 25 MG tablet    Sig: Take 1 tablet (25 mg total) by mouth daily.    Dispense:  90 tablet    Refill:  3   ezetimibe (ZETIA) 10 MG tablet    Sig: Take 1 tablet (10 mg total) by mouth daily.    Dispense:  90 tablet    Refill:  3   atorvastatin (LIPITOR) 80 MG tablet    Sig: Take 1 tablet (80 mg total) by mouth at bedtime.    Dispense:  90 tablet    Refill:  3   AMITIZA  24 MCG capsule    Sig: Take 1 capsule (24 mcg total) by mouth daily with breakfast.    Dispense:  90 capsule    Refill:  3     Follow up plan: Return in about 6 months (around 04/13/2024) for 1 year fasting lab > 1 week later Annual Physical.  Saralyn Pilar, DO [facility] 10/15/2023, 1:43 PM

## 2023-10-15 NOTE — Assessment & Plan Note (Signed)
BPH LUTS Now off alpha blocker tamsulosin due to side effects Defer other management at this time

## 2023-10-15 NOTE — Assessment & Plan Note (Signed)
Elevated A1c to 6.9 at risk of progression to Type 2 Diabetes Concern w obesity HYPERTENSION CKD HLD  Discussion consider meds next visit if still elevated.

## 2023-10-15 NOTE — Assessment & Plan Note (Signed)
Stable, without flare Followed by Dr Meredeth Ide [facility] Pulm Continues on Spiriva Albuterol PRN Remains smoke free

## 2023-11-06 ENCOUNTER — Other Ambulatory Visit: Payer: Self-pay | Admitting: Family Medicine

## 2023-11-06 DIAGNOSIS — E785 Hyperlipidemia, unspecified: Secondary | ICD-10-CM

## 2023-11-06 NOTE — Telephone Encounter (Signed)
Zetia :10/15/23 #90 3 RF Atorvastatin: 10/15/23 #90 3 RF  Requested Prescriptions  Refused Prescriptions Disp Refills   ezetimibe (ZETIA) 10 MG tablet [Pharmacy Med Name: Ezetimibe Oral Tablet 10 MG] 90 tablet 3    Sig: TAKE 1 TABLET EVERY DAY     Cardiovascular:  Antilipid - Sterol Transport Inhibitors Failed - 11/06/2023 11:52 AM      Failed - Lipid Panel in normal range within the last 12 months    Cholesterol, Total  Date Value Ref Range Status  09/20/2015 132 100 - 199 mg/dL Final   Cholesterol  Date Value Ref Range Status  10/07/2023 115 <200 mg/dL Final   LDL Cholesterol (Calc)  Date Value Ref Range Status  10/07/2023 58 mg/dL (calc) Final    Comment:    Reference range: <100 . Desirable range <100 mg/dL for primary prevention;   <70 mg/dL for patients with CHD or diabetic patients  with > or = 2 CHD risk factors. Marland Kitchen LDL-C is now calculated using the Martin-Hopkins  calculation, which is a validated novel method providing  better accuracy than the Friedewald equation in the  estimation of LDL-C.  Horald Pollen et al. Lenox Ahr. 6578;469(62): 2061-2068  (http://education.[facility].com/faq/FAQ164)    HDL  Date Value Ref Range Status  10/07/2023 41 > OR = 40 mg/dL Final  95/28/4132 38 (L) >39 mg/dL Final    Comment:    According to ATP-III Guidelines, HDL-C >59 mg/dL is considered a negative risk factor for CHD.    Triglycerides  Date Value Ref Range Status  10/07/2023 81 <150 mg/dL Final         Passed - AST in normal range and within 360 days    AST  Date Value Ref Range Status  10/07/2023 21 10 - 35 U/L Final         Passed - ALT in normal range and within 360 days    ALT  Date Value Ref Range Status  10/07/2023 17 9 - 46 U/L Final         Passed - Patient is not pregnant      Passed - Valid encounter within last 12 months    Recent Outpatient Visits           3 weeks ago Annual physical exam   [place] [facility]  Smitty Cords, DO   7 months ago Pre-diabetes   [place] [facility] Althea Charon, Netta Neat, DO   1 year ago Annual physical exam   [place] [facility] Smitty Cords, DO   1 year ago Acute cystitis without hematuria   [place] [facility] Smitty Cords, DO   1 year ago Acute cystitis with hematuria   [place] [facility] Althea Charon, Netta Neat, DO       Future Appointments             In 4 months Althea Charon, Netta Neat, DO [place] [facility], [place]   In 11 months Althea Charon, Netta Neat, DO [place] [facility]             atorvastatin (LIPITOR) 80 MG tablet [Pharmacy Med Name: Atorvastatin Calcium Oral Tablet 80 MG] 90 tablet 3    Sig: TAKE 1 TABLET AT BEDTIME     Cardiovascular:  Antilipid - Statins Failed - 11/06/2023 11:52 AM      Failed - Lipid Panel in normal range within  the last 12 months    Cholesterol, Total  Date Value Ref Range Status  09/20/2015 132 100 - 199 mg/dL Final   Cholesterol  Date Value Ref Range Status  10/07/2023 115 <200 mg/dL Final   LDL Cholesterol (Calc)  Date Value Ref Range Status  10/07/2023 58 mg/dL (calc) Final    Comment:    Reference range: <100 . Desirable range <100 mg/dL for primary prevention;   <70 mg/dL for patients with CHD or diabetic patients  with > or = 2 CHD risk factors. Marland Kitchen LDL-C is now calculated using the Martin-Hopkins  calculation, which is a validated novel method providing  better accuracy than the Friedewald equation in the  estimation of LDL-C.  Horald Pollen et al. Lenox Ahr. 0865;784(69): 2061-2068  (http://education.[facility].com/faq/FAQ164)    HDL  Date Value Ref Range Status  10/07/2023 41 > OR = 40 mg/dL Final  62/95/2841 38 (L) >39 mg/dL Final    Comment:    According to ATP-III Guidelines, HDL-C >59 mg/dL is considered a negative risk  factor for CHD.    Triglycerides  Date Value Ref Range Status  10/07/2023 81 <150 mg/dL Final         Passed - Patient is not pregnant      Passed - Valid encounter within last 12 months    Recent Outpatient Visits           3 weeks ago Annual physical exam   [place] [facility] [place], Netta Neat, DO   7 months ago Pre-diabetes   [place] [facility] Smitty Cords, DO   1 year ago Annual physical exam   [place] [facility] Smitty Cords, DO   1 year ago Acute cystitis without hematuria   [place] [facility] Smitty Cords, DO   1 year ago Acute cystitis with hematuria   [place] [facility] Althea Charon, Netta Neat, DO       Future Appointments             In 4 months Althea Charon, Netta Neat, DO [place] [facility], [place]   In 11 months Althea Charon, Netta Neat, DO [place] [facility], [place]

## 2023-11-14 ENCOUNTER — Other Ambulatory Visit: Payer: Self-pay | Admitting: Family Medicine

## 2023-11-15 NOTE — Telephone Encounter (Signed)
Requested medication (s) are due for refill today:   Requested medication (s) are on the active medication list: No  Last refill:  ?  Future visit scheduled: Yes  Notes to clinic:  Not on list.    Requested Prescriptions  Pending Prescriptions Disp Refills   tamsulosin (FLOMAX) 0.4 MG CAPS capsule [Pharmacy Med Name: Tamsulosin HCl Oral Capsule 0.4 MG] 90 capsule 3    Sig: TAKE 1 CAPSULE EVERY DAY AFTER BREAKFAST     Urology: Alpha-Adrenergic Blocker Passed - 11/15/2023 11:47 AM      Passed - PSA in normal range and within 360 days    PSA  Date Value Ref Range Status  10/07/2023 1.37 < OR = 4.00 ng/mL Final    Comment:    The total PSA value from this assay system is  standardized against the WHO standard. The test  result will be approximately 20% lower when compared  to the equimolar-standardized total PSA (Beckman  Coulter). Comparison of serial PSA results should be  interpreted with this fact in mind. . This test was performed using the Siemens  chemiluminescent method. Values obtained from  different assay methods cannot be used interchangeably. PSA levels, regardless of value, should not be interpreted as absolute evidence of the presence or absence of disease.          Passed - Last BP in normal range    BP Readings from Last 1 Encounters:  10/15/23 100/60         Passed - Valid encounter within last 12 months    Recent Outpatient Visits           1 month ago Annual physical exam   [place] [facility] [place], Netta Neat, DO   7 months ago Pre-diabetes   [place] [facility] Smitty Cords, DO   1 year ago Annual physical exam   [place] [facility] Smitty Cords, DO   1 year ago Acute cystitis without hematuria   [place] [facility] Smitty Cords, DO   1 year ago Acute cystitis with hematuria   [place] [facility]  Althea Charon, Netta Neat, DO       Future Appointments             In 3 months Althea Charon, Netta Neat, DO [place] [facility], [place]   In 11 months Althea Charon, Netta Neat, DO  [facility] [place], [place]

## 2024-03-06 ENCOUNTER — Other Ambulatory Visit: Payer: Self-pay

## 2024-03-06 DIAGNOSIS — N1831 Chronic kidney disease, stage 3a: Secondary | ICD-10-CM

## 2024-03-06 DIAGNOSIS — R7303 Prediabetes: Secondary | ICD-10-CM

## 2024-03-06 DIAGNOSIS — N183 Chronic kidney disease, stage 3 unspecified: Secondary | ICD-10-CM

## 2024-03-07 LAB — BASIC METABOLIC PANEL WITHOUT GFR
BUN/Creatinine Ratio: 12 (calc) (ref 6–22)
BUN: 22 mg/dL (ref 7–25)
CO2: 25 mmol/L (ref 20–32)
Calcium: 8.8 mg/dL (ref 8.6–10.3)
Chloride: 109 mmol/L (ref 98–110)
Creat: 1.82 mg/dL — ABNORMAL HIGH (ref 0.70–1.28)
Glucose, Bld: 109 mg/dL — ABNORMAL HIGH (ref 65–99)
Potassium: 4.7 mmol/L (ref 3.5–5.3)
Sodium: 142 mmol/L (ref 135–146)

## 2024-03-07 LAB — HEMOGLOBIN A1C
Hgb A1c MFr Bld: 6.9 % — ABNORMAL HIGH (ref <5.7)
Mean Plasma Glucose: 151 mg/dL
eAG (mmol/L): 8.4 mmol/L

## 2024-03-13 ENCOUNTER — Other Ambulatory Visit: Payer: Self-pay | Admitting: Family Medicine

## 2024-03-13 ENCOUNTER — Ambulatory Visit: Payer: Self-pay | Admitting: Family Medicine

## 2024-03-13 ENCOUNTER — Encounter: Payer: Self-pay | Admitting: Family Medicine

## 2024-03-13 VITALS — BP 122/80 | HR 53 | Ht 71.0 in | Wt 233.2 lb

## 2024-03-13 DIAGNOSIS — N401 Enlarged prostate with lower urinary tract symptoms: Secondary | ICD-10-CM

## 2024-03-13 DIAGNOSIS — I129 Hypertensive chronic kidney disease with stage 1 through stage 4 chronic kidney disease, or unspecified chronic kidney disease: Secondary | ICD-10-CM | POA: Diagnosis not present

## 2024-03-13 DIAGNOSIS — N183 Chronic kidney disease, stage 3 unspecified: Secondary | ICD-10-CM

## 2024-03-13 DIAGNOSIS — N1831 Chronic kidney disease, stage 3a: Secondary | ICD-10-CM

## 2024-03-13 DIAGNOSIS — I2510 Atherosclerotic heart disease of native coronary artery without angina pectoris: Secondary | ICD-10-CM

## 2024-03-13 DIAGNOSIS — E1169 Type 2 diabetes mellitus with other specified complication: Secondary | ICD-10-CM | POA: Diagnosis not present

## 2024-03-13 DIAGNOSIS — N138 Other obstructive and reflux uropathy: Secondary | ICD-10-CM

## 2024-03-13 DIAGNOSIS — Z0000 Encounter for general adult medical examination without abnormal findings: Secondary | ICD-10-CM

## 2024-03-13 DIAGNOSIS — E785 Hyperlipidemia, unspecified: Secondary | ICD-10-CM

## 2024-03-13 DIAGNOSIS — K5900 Constipation, unspecified: Secondary | ICD-10-CM

## 2024-03-13 MED ORDER — LUBIPROSTONE 24 MCG PO CAPS: 24.0000 ug | ORAL_CAPSULE | Freq: Two times a day (BID) | ORAL | 0 refills | Status: DC

## 2024-03-13 NOTE — Patient Instructions (Addendum)
 Thank you for coming to the office today.  Recent Labs    04/05/23 0923 10/07/23 0752 03/06/24 0813  HGBA1C 6.2* 6.9* 6.9*   New Type 2 Diabetic  Diet Recommendations for Type 2 Diabetes   REDUCE Starchy (carb) foods include: Bread, rice, pasta, potatoes, corn, crackers, bagels, muffins, all baked goods.   FRUITS - LIMIT these HIGH sugar/carb fruits = Pineapple, Watermelon, Bananas - OKAY with these MEDIUM sugar/carb fruits = Citrus, Oranges, Grapes - PREFER these LOW sugar/carb fruits = Apples, Berries, Pears, Plums  Protein foods include: Meat, fish, poultry, eggs, dairy foods, and beans such as pinto and kidney beans (beans also provide carbohydrate).   1. Eat at least 3 meals and 1-2 snacks per day. Never go more than 4-5 hours while awake without eating.   2. Limit starchy foods to TWO per meal and ONE per snack. ONE portion of a starchy  food is equal to the following:   - ONE slice of bread (or its equivalent, such as half of a hamburger bun).   - 1/2 cup of a "scoopable" starchy food such as potatoes or rice.   - 1 OUNCE (28 grams) of starchy snacks (crackers or pretzels, look on label).   - 15 grams of carbohydrate as shown on food label.   3. Both lunch and dinner should include a protein food, a carb food, and vegetables.   - Obtain twice as many veg's as protein or carbohydrate foods for both lunch and dinner.   - Try to keep frozen veg's on hand for a quick vegetable serving.     - Fresh or frozen veg's are best.   4. Breakfast should always include protein.    Generic ver Amitiza  Lubiprostone  24 twice a day 60 pills can use goodrx coupon if high price  DUE for FASTING BLOOD WORK (no food or drink after midnight before the lab appointment, only water or coffee without cream/sugar on the morning of)  SCHEDULE "Lab Only" visit in the morning at the clinic for lab draw in 6 MONTHS   - Make sure Lab Only appointment is at about 1 week before your next appointment,  so that results will be available  For Lab Results, once available within 2-3 days of blood draw, you can can log in to MyChart online to view your results and a brief explanation. Also, we can discuss results at next follow-up visit.   Please schedule a Follow-up Appointment to: Return for 6 month fasting lab > 1 week later Annual Physical.  If you have any other questions or concerns, please feel free to call the office or send a message through MyChart. You may also schedule an earlier appointment if necessary.  Additionally, you may be receiving a survey about your experience at our office within a few days to 1 week by e-mail or mail. We value your feedback.  Domingo Friend, DO [facility], [place]

## 2024-03-13 NOTE — Progress Notes (Signed)
 Subjective:    Patient ID: George Jensen, male    DOB: Apr 28, 1946, 78 y.o.   MRN: [id number]  George Jensen is a 78 y.o. male presenting on 03/13/2024 for Diabetes   HPI  Discussed the use of AI scribe software for clinical note transcription with the patient, who gave verbal consent to proceed.  History of Present Illness   George Jensen is a 78 year old male with type 2 diabetes who presents for follow-up on blood sugar management and constipation.   Type 2 Diabetes Last A1c 6.9 now again, new diagnosis of Type 2 diabetes. Attributed to inc sweet intake cookies honeybun candy CBGs: Does not check CBG Meds: No medication (never on med) Currently on ARB Goal to improve exercise - Taking ASA 81mg , Statin DM Eye followed by [facility] Denies hypoglycemia, polyuria, visual changes, numbness or tingling.   CHRONIC HTN / CKD-III Known chronic CKD-III - last lab with elevated Cr and reduced GFR still. Cr 1.82 from 1.75, seems stable in past 6 months Concern with now elevated glucose see above Current Meds - Metoprolol  25mg  BID, Losartan  25mg  Reports good compliance, took meds today. Tolerating well, w/o complaints. - Tries to stay well hydrated with water. Admits had eaten many bananas lately Denies CP, dyspnea, HA, edema, dizziness / lightheadedness    Panlobular Emphysema COPD, Former Smoker Followed by Dr Jamal Mays, good report recently with doing very well on PFTs On Spiriva - Rarely using Albuterol  PRN   HYPERLIPIDEMIA: - Reports no concerns. Last lipid panel 09/2023, controlled lipids on medicines LDL 58 - Currently taking Zetia  and Atorvastatin , tolerating well without side effects or myalgias   Major Depression in Remission No new concerns, see PHQ On Sertraline  50mg  daily    BPH LUTS No longer able to tolerate Tamsulosin  for BPH symptoms. Due to side effects. Off medication, had dizziness - PSA at 1.37 prior 0.86  Constipation Failed Miralax, Colace,  Amitiza  (now high cost) Uses Ex-lax AS NEEDED    Health Maintenance     03/13/2024    9:31 AM 10/15/2023    1:39 PM 04/19/2023    3:31 PM  Depression screen PHQ 2/9  Decreased Interest 0 0 0  Down, Depressed, Hopeless 0 0 0  PHQ - 2 Score 0 0 0  Altered sleeping 0 0 0  Tired, decreased energy 0 0 0  Change in appetite 0 0 0  Feeling bad or failure about yourself  0 0 0  Trouble concentrating 0 0 0  Moving slowly or fidgety/restless 0 0 0  Suicidal thoughts 0 0 0  PHQ-9 Score 0 0 0  Difficult doing work/chores Not difficult at all Not difficult at all Not difficult at all       03/13/2024    9:31 AM 10/15/2023    1:40 PM 04/05/2023    9:35 AM 10/05/2022    9:10 AM  GAD 7 : Generalized Anxiety Score  Nervous, Anxious, on Edge 0 0 0 0  Control/stop worrying 0 0 0 0  Worry too much - different things 0 0 0 0  Trouble relaxing 0 0 0 0  Restless 0 0 0 0  Easily annoyed or irritable 0 0 0 0  Afraid - awful might happen 0 0 0 0  Total GAD 7 Score 0 0 0 0  Anxiety Difficulty Not difficult at all Not difficult at all  Not difficult at all    Social History   Tobacco Use  Smoking status: Former    Current packs/day: 0.00    Types: Cigarettes    Quit date: 10/19/2014    Years since quitting: 9.4   Smokeless tobacco: Former  Building services engineer status: Never Used  Substance Use Topics   Alcohol use: No   Drug use: No    Review of Systems Per HPI unless specifically indicated above     Objective:    BP 122/80 (BP Location: Right Arm, Patient Position: Sitting, Cuff Size: Normal)   Pulse (!) 53   Ht 5\' 11"  (1.803 m)   Wt 233 lb 3.2 oz (105.8 kg)   SpO2 98%   BMI 32.52 kg/m   Wt Readings from Last 3 Encounters:  03/13/24 233 lb 3.2 oz (105.8 kg)  10/15/23 236 lb 3.2 oz (107.1 kg)  04/19/23 236 lb (107 kg)    Physical Exam Vitals and nursing note reviewed.  Constitutional:      General: He is not in acute distress.    Appearance: He is well-developed. He is  not diaphoretic.     Comments: Well-appearing, comfortable, cooperative  HENT:     Head: Normocephalic and atraumatic.  Eyes:     General:        Right eye: No discharge.        Left eye: No discharge.     Conjunctiva/sclera: Conjunctivae normal.  Neck:     Thyroid: No thyromegaly.  Cardiovascular:     Rate and Rhythm: Normal rate and regular rhythm.     Pulses: Normal pulses.     Heart sounds: Normal heart sounds. No murmur heard. Pulmonary:     Effort: Pulmonary effort is normal. No respiratory distress.     Breath sounds: Normal breath sounds. No wheezing or rales.  Musculoskeletal:        General: Normal range of motion.     Cervical back: Normal range of motion and neck supple.  Lymphadenopathy:     Cervical: No cervical adenopathy.  Skin:    General: Skin is warm and dry.     Findings: No erythema or rash.  Neurological:     Mental Status: He is alert and oriented to person, place, and time. Mental status is at baseline.  Psychiatric:        Behavior: Behavior normal.     Comments: Well groomed, good eye contact, normal speech and thoughts     Diabetic Foot Exam - Simple   Simple Foot Form Diabetic Foot exam was performed with the following findings: Yes 03/13/2024  9:51 AM  Visual Inspection No deformities, no ulcerations, no other skin breakdown bilaterally: Yes Sensation Testing Intact to touch and monofilament testing bilaterally: Yes Pulse Check Posterior Tibialis and Dorsalis pulse intact bilaterally: Yes Comments      Results for orders placed or performed in visit on 03/06/24  Hemoglobin A1c   Collection Time: 03/06/24  8:13 AM  Result Value Ref Range   Hgb A1c MFr Bld 6.9 (H) <5.7 %   Mean Plasma Glucose 151 mg/dL   eAG (mmol/L) 8.4 mmol/L  BASIC METABOLIC PANEL WITH GFR   Collection Time: 03/06/24  8:13 AM  Result Value Ref Range   Glucose, Bld 109 (H) 65 - 99 mg/dL   BUN 22 7 - 25 mg/dL   Creat 1.30 (H) 8.65 - 1.28 mg/dL   BUN/Creatinine  Ratio 12 6 - 22 (calc)   Sodium 142 135 - 146 mmol/L   Potassium 4.7 3.5 - 5.3 mmol/L  Chloride 109 98 - 110 mmol/L   CO2 25 20 - 32 mmol/L   Calcium  8.8 8.6 - 10.3 mg/dL      Assessment & Plan:   Problem List Items Addressed This Visit     Benign hypertension with CKD (chronic kidney disease) stage III (HCC)   BPH with obstruction/lower urinary tract symptoms   CKD (chronic kidney disease), stage III (HCC)   Dyslipidemia   Type 2 diabetes mellitus with other specified complication (HCC) - Primary   Relevant Orders   Urine Microalbumin w/creat. ratio   Other Visit Diagnoses       Constipation, unspecified constipation type       Relevant Medications   lubiprostone  (AMITIZA ) 24 MCG capsule        Type 2 Diabetes Mellitus, new diagnosis New diagnosis, however it is mild. A1c at 6.9% indicates controlled range for diabetes Discussed dietary management and potential complications. Recommended lifestyle changes over medication. - Encourage dietary changes to reduce sugar intake, avoid sweets. - Schedule follow-up in six months to reassess A1c and diabetes management. - Continue annual ophthalmologic exams with Dr. Alto Atta. - Monitor for signs of diabetic complications, such as foot ulcers or changes in sensation. - Consider Jardiance for CKD-III  Constipation Chronic constipation managed with Exlax due to insurance issues with Amitiza . Discussed generic Lubiprostone  Amitiza  with GoodRx coupon for cost reduction. - Prescribe generic Lubiprostone  Amitiza  twice daily. - Use GoodRx coupon if necessary to reduce cost. - Continue Exlax as needed for symptom relief.  Hypertension / CKD  Hypertension well-controlled. No issues with current medications. Emphasized regular screenings and healthy lifestyle. Concern with persistent elevated Cr CKD, check urine microalbumin next visit / Creatinine and if still elevated may pursue Nephrology, consider Jardiance add on - Schedule next  wellness visit in Nov/Dec, aligning with spouse's appointment. - Continue current medications as prescribed.      BPH Off Tamsulosin  Stable currently  Hyperlipidemia CAD On ASA On Zetia  10mg  and Atorvastatin  80mg   Orders Placed This Encounter  Procedures   Urine Microalbumin w/creat. ratio    Meds ordered this encounter  Medications   lubiprostone  (AMITIZA ) 24 MCG capsule    Sig: Take 1 capsule (24 mcg total) by mouth 2 (two) times daily with a meal.    Dispense:  60 capsule    Refill:  0    Follow up plan: Return for 6 month fasting lab > 1 week later Annual Physical.  Future labs ordered for 10/14/24   Domingo Friend, DO [facility] 03/13/2024, 9:32 AM

## 2024-03-14 LAB — MICROALBUMIN / CREATININE URINE RATIO
Creatinine, Urine: 151 mg/dL (ref 20–320)
Microalb Creat Ratio: 674 mg/g{creat} — ABNORMAL HIGH (ref <30)
Microalb, Ur: 101.7 mg/dL

## 2024-03-23 ENCOUNTER — Other Ambulatory Visit: Payer: Self-pay | Admitting: Specialist

## 2024-03-23 DIAGNOSIS — F17210 Nicotine dependence, cigarettes, uncomplicated: Secondary | ICD-10-CM

## 2024-03-23 DIAGNOSIS — J439 Emphysema, unspecified: Secondary | ICD-10-CM

## 2024-03-30 ENCOUNTER — Ambulatory Visit
Admission: RE | Admit: 2024-03-30 | Discharge: 2024-03-30 | Disposition: A | Discharge status: Home / Self Care | Source: Ambulatory Visit | Attending: Specialist | Admitting: Specialist

## 2024-03-30 DIAGNOSIS — F17210 Nicotine dependence, cigarettes, uncomplicated: Secondary | ICD-10-CM | POA: Diagnosis present

## 2024-03-30 DIAGNOSIS — J439 Emphysema, unspecified: Secondary | ICD-10-CM | POA: Diagnosis present

## 2024-04-05 ENCOUNTER — Other Ambulatory Visit: Payer: Self-pay | Admitting: Family Medicine

## 2024-04-05 DIAGNOSIS — K5900 Constipation, unspecified: Secondary | ICD-10-CM

## 2024-04-07 NOTE — Telephone Encounter (Signed)
 Requested medications are due for refill today.  yes  Requested medications are on the active medications list.  yes  Last refill. 03/13/2024 #60 0 rf  Future visit scheduled.   yes  Notes to clinic.  New medication to this pt.     Requested Prescriptions  Pending Prescriptions Disp Refills   lubiprostone  (AMITIZA ) 24 MCG capsule [Pharmacy Med Name: LUBIPROSTONE  24 MCG CAPSULE] 180 capsule 1    Sig: Take 1 capsule (24 mcg total) by mouth 2 (two) times daily with a meal.     Gastroenterology: Irritable Bowel Syndrome - lubiprostone  Passed - 04/07/2024  4:59 PM      Passed - AST in normal range and within 360 days    AST  Date Value Ref Range Status  10/07/2023 21 10 - 35 U/L Final         Passed - ALT in normal range and within 360 days    ALT  Date Value Ref Range Status  10/07/2023 17 9 - 46 U/L Final         Passed - Valid encounter within last 12 months    Recent Outpatient Visits           3 weeks ago Type 2 diabetes mellitus with other specified complication, without long-term current use of insulin (HCC)   [place] [facility] [place], Kayleen Party, DO       Future Appointments             In 6 months [place], Kayleen Party, DO [facility], [facility]

## 2024-04-24 ENCOUNTER — Ambulatory Visit: Payer: Medicare PPO

## 2024-04-24 DIAGNOSIS — Z0000 Encounter for general adult medical examination without abnormal findings: Secondary | ICD-10-CM | POA: Diagnosis not present

## 2024-04-24 NOTE — Progress Notes (Signed)
 Subjective:   George Jensen is a 78 y.o. who presents for a Medicare Wellness preventive visit.  As a reminder, Annual Wellness Visits don't include a physical exam, and some assessments may be limited, especially if this visit is performed virtually. We may recommend an in-person follow-up visit with your provider if needed.  Visit Complete: Virtual I connected with  George Jensen on 04/24/24 by a audio enabled telemedicine application and verified that I am speaking with the correct person using two identifiers.  Patient Location: Home  Provider Location: Home Office  I discussed the limitations of evaluation and management by telemedicine. The patient expressed understanding and agreed to proceed.  Vital Signs: Because this visit was a virtual/telehealth visit, some criteria may be missing or patient reported. Any vitals not documented were not able to be obtained and vitals that have been documented are patient reported.  VideoDeclined- This patient declined Librarian, academic. Therefore the visit was completed with audio only.  Persons Participating in Visit: Patient.  AWV Questionnaire: No: Patient Medicare AWV questionnaire was not completed prior to this visit.  Cardiac Risk Factors include: advanced age (>26men, >30 women);diabetes mellitus;hypertension;male gender;dyslipidemia;obesity (BMI >30kg/m2)     Objective:     There were no vitals filed for this visit. There is no height or weight on file to calculate BMI.     04/24/2024    4:02 PM 04/19/2023    3:32 PM 04/04/2021    9:01 AM 03/29/2020    9:08 AM 10/07/2018   10:00 AM 06/11/2017   10:08 AM 09/16/2015    9:41 AM  Advanced Directives  Does Patient Have a Medical Advance Directive? No No No No No Yes No  Type of Careers adviser;Living will   Copy of Healthcare Power of Attorney in Chart?      No - copy requested   Would patient like  information on creating a medical advance directive? No - Patient declined No - Patient declined  No - Patient declined   No - patient declined information    Current Medications (verified) Outpatient Encounter Medications as of 04/24/2024  Medication Sig   albuterol  (PROVENTIL  HFA;VENTOLIN  HFA) 108 (90 BASE) MCG/ACT inhaler Inhale 2 puffs into the lungs every 4 (four) hours as needed for wheezing or shortness of breath (or shortness of breath).   aspirin 81 MG EC tablet TAKE 1 TABLET EVERY DAY   atorvastatin  (LIPITOR) 80 MG tablet Take 1 tablet (80 mg total) by mouth at bedtime.   ezetimibe  (ZETIA ) 10 MG tablet Take 1 tablet (10 mg total) by mouth daily.   losartan  (COZAAR ) 25 MG tablet Take 1 tablet (25 mg total) by mouth daily.   lubiprostone  (AMITIZA ) 24 MCG capsule TAKE 1 CAPSULE (24 MCG TOTAL) BY MOUTH 2 (TWO) TIMES DAILY WITH A MEAL.   metoprolol  tartrate (LOPRESSOR ) 25 MG tablet Take 1 tablet (25 mg total) by mouth 2 (two) times daily.   Multiple Vitamins-Minerals (MULTIVITAMIN MEN 50+) TABS Take daily by mouth.   nitroGLYCERIN (NITROSTAT) 0.4 MG SL tablet Place 0.4 mg under the tongue every 5 (five) minutes as needed for chest pain. AS NEEDED   omeprazole  (PRILOSEC) 40 MG capsule Take 1 capsule (40 mg total) by mouth daily.   sertraline  (ZOLOFT ) 50 MG tablet Take 1 tablet (50 mg total) by mouth daily.   tiotropium (SPIRIVA) 18 MCG inhalation capsule Place 18 mcg into inhaler and inhale every other  day.    No facility-administered encounter medications on file as of 04/24/2024.    Allergies (verified) Patient has no known allergies.   History: Past Medical History:  Diagnosis Date   BCC (basal cell carcinoma)    x 2 on face   COPD (chronic obstructive pulmonary disease) (HCC)    Hyperlipidemia    Hypertension    Melanoma (HCC)    Myocardial infarction (HCC) 10/2014   SCCA (squamous cell carcinoma) of skin    SCC x 1 on face   Past Surgical History:  Procedure Laterality  Date   CAROTID STENT  10/2014   3 STENTS [facility]   MELANOMA EXCISION  1982   Family History  Problem Relation Age of Onset   Alzheimer's disease Mother    Diabetes Father    Heart disease Father    Social History   Socioeconomic History   Marital status: Married    Spouse name: George Jensen   Number of children: 4   Years of education: Not on file   Highest education level: 9th grade  Occupational History   Occupation: Heavy Media planner  Tobacco Use   Smoking status: Former    Current packs/day: 0.00    Types: Cigarettes    Quit date: 10/19/2014    Years since quitting: 9.5   Smokeless tobacco: Former  Building services engineer status: Never Used  Substance and Sexual Activity   Alcohol use: No   Drug use: No   Sexual activity: Not on file  Other Topics Concern   Not on file  Social History Narrative   Not on file   Social Drivers of Health   Financial Resource Strain: Low Risk  (04/24/2024)   Overall Financial Resource Strain (CARDIA)    Difficulty of Paying Living Expenses: Not hard at all  Food Insecurity: No Food Insecurity (04/24/2024)   Hunger Vital Sign    Worried About Running Out of Food in the Last Year: Never true    Ran Out of Food in the Last Year: Never true  Transportation Needs: No Transportation Needs (04/24/2024)   PRAPARE - Administrator, Civil Service (Medical): No    Lack of Transportation (Non-Medical): No  Physical Activity: Insufficiently Active (04/24/2024)   Exercise Vital Sign    Days of Exercise per Week: 7 days    Minutes of Exercise per Session: 20 min  Stress: No Stress Concern Present (04/24/2024)   [employer] of Occupational Health - Occupational Stress Questionnaire    Feeling of Stress : Not at all  Social Connections: Socially Isolated (04/24/2024)   Social Connection and Isolation Panel [NHANES]    Frequency of Communication with Friends and Family: Twice a week    Frequency of Social Gatherings with Friends  and Family: Never    Attends Religious Services: Never    Database administrator or Organizations: No    Attends Engineer, structural: Never    Marital Status: Married    Tobacco Counseling Counseling given: Not Answered    Clinical Intake:  Pre-visit preparation completed: Yes  Pain : No/denies pain     BMI - recorded: 32.5 Nutritional Status: BMI > 30  Obese Nutritional Risks: None Diabetes: No  Lab Results  Component Value Date   HGBA1C 6.9 (H) 03/06/2024   HGBA1C 6.9 (H) 10/07/2023   HGBA1C 6.2 (A) 04/05/2023     How often do you need to have someone help you when you read instructions,  pamphlets, or other written materials from your doctor or pharmacy?: 1 - Never  Interpreter Needed?: No  Information entered by :: Dellie Fergusson, LPN   Activities of Daily Living    04/24/2024    4:02 PM  In your present state of health, do you have any difficulty performing the following activities:  Hearing? 0  Vision? 0  Difficulty concentrating or making decisions? 0  Walking or climbing stairs? 0  Dressing or bathing? 0  Doing errands, shopping? 0  Preparing Food and eating ? N  Using the Toilet? N  In the past six months, have you accidently leaked urine? N  Do you have problems with loss of bowel control? N  Managing your Medications? N  Managing your Finances? N  Housekeeping or managing your Housekeeping? N    Patient Care Team: Raina Bunting, DO as PCP - General (Family Medicine) Adelaida Holts, MD as Referring Physician (Specialist) Julia Oats, OD (Optometry) Mariane Shire, MD (Dermatology) Percival Brace, MD as Consulting Physician (Cardiology)  I have updated your Care Teams any recent Medical Services you may have received from other providers in the past year.     Assessment:    This is a routine wellness examination for Undrea.  Hearing/Vision screen Hearing Screening - Comments:: NO AIDS Vision  Screening - Comments:: WEARS GLASSES ALL DAY, HAS HAD CATARACT SGY- [place]   Goals Addressed             This Visit's Progress    Cut out extra servings         Depression Screen     04/24/2024    4:00 PM 03/13/2024    9:31 AM 10/15/2023    1:39 PM 04/19/2023    3:31 PM 04/05/2023    9:35 AM 10/05/2022    9:10 AM 04/12/2022    8:14 AM  PHQ 2/9 Scores  PHQ - 2 Score 0 0 0 0 0 0 0  PHQ- 9 Score 0 0 0 0  0 0    Fall Risk     04/24/2024    4:02 PM 03/13/2024    9:32 AM 10/15/2023    1:39 PM 04/19/2023    3:33 PM 04/05/2023    9:35 AM  Fall Risk   Falls in the past year? 0 0 0 0 0  Number falls in past yr: 0 0  0   Injury with Fall? 0 0  0   Risk for fall due to : No Fall Risks No Fall Risks  No Fall Risks   Follow up Falls evaluation completed Falls evaluation completed  Falls prevention discussed;Falls evaluation completed     MEDICARE RISK AT HOME:  Medicare Risk at Home Any stairs in or around the home?: Yes If so, are there any without handrails?: No Home free of loose throw rugs in walkways, pet beds, electrical cords, etc?: Yes Adequate lighting in your home to reduce risk of falls?: Yes Life alert?: No Use of a cane, walker or w/c?: No Grab bars in the bathroom?: Yes Shower chair or bench in shower?: Yes Elevated toilet seat or a handicapped toilet?: No  TIMED UP AND GO:  Was the test performed?  No  Cognitive Function: 6CIT completed        04/24/2024    4:04 PM 04/19/2023    3:38 PM 04/12/2022    8:38 AM 04/04/2021    9:03 AM 10/07/2018   10:04 AM  6CIT Screen  What Year? 0  points 0 points 0 points 0 points 0 points  What month? 0 points 0 points 0 points 0 points 0 points  What time? 0 points 0 points 0 points 0 points 0 points  Count back from 20 0 points 0 points 0 points 0 points 0 points  Months in reverse 0 points 0 points 0 points 2 points 0 points  Repeat phrase 0 points 0 points 2 points 8 points 2 points  Total Score 0 points 0 points 2  points 10 points 2 points    Immunizations Immunization History  Administered Date(s) Administered   Fluad Quad(high Dose 65+) 09/25/2019, 09/29/2020, 10/06/2021, 10/05/2022   Fluad Trivalent(High Dose 65+) 10/15/2023   Influenza, High Dose Seasonal PF 08/12/2015, 09/06/2016, 09/20/2017, 10/07/2018   Moderna SARS-COV2 Booster Vaccination 05/05/2021   Moderna Sars-Covid-2 Vaccination 03/24/2020, 04/21/2020, 10/30/2020   PNEUMOCOCCAL CONJUGATE-20 10/15/2023   Pneumococcal Conjugate-13 08/12/2015, 09/06/2016   Pneumococcal Polysaccharide-23 09/27/2017    Screening Tests Health Maintenance  Topic Date Due   DTaP/Tdap/Td (1 - Tdap) Never done   Colonoscopy  Never done   Zoster Vaccines- Shingrix (1 of 2) Never done   COVID-19 Vaccine (5 - 2024-25 season) 07/21/2023   INFLUENZA VACCINE  06/19/2024   HEMOGLOBIN A1C  09/05/2024   OPHTHALMOLOGY EXAM  09/12/2024   Diabetic kidney evaluation - eGFR measurement  03/06/2025   Diabetic kidney evaluation - Urine ACR  03/13/2025   FOOT EXAM  03/13/2025   Lung Cancer Screening  03/30/2025   Medicare Annual Wellness (AWV)  04/24/2025   Pneumonia Vaccine 32+ Years old  Completed   Hepatitis C Screening  Completed   HPV VACCINES  Aged Out   Meningococcal B Vaccine  Aged Out    Health Maintenance  Health Maintenance Due  Topic Date Due   DTaP/Tdap/Td (1 - Tdap) Never done   Colonoscopy  Never done   Zoster Vaccines- Shingrix (1 of 2) Never done   COVID-19 Vaccine (5 - 2024-25 season) 07/21/2023   Health Maintenance Items Addressed: DECLINES COLONOSCOPY; UP TO DATE ON PNA, WANTS NO MORE COVIDS, DECLINES SHINGRIX  Additional Screening:  Vision Screening: Recommended annual ophthalmology exams for early detection of glaucoma and other disorders of the eye. Would you like a referral to an eye doctor? No    Dental Screening: Recommended annual dental exams for proper oral hygiene  Community Resource Referral / Chronic Care  Management: CRR required this visit?  No   CCM required this visit?  No   Plan:    I have personally reviewed and noted the following in the patient's chart:   Medical and social history Use of alcohol, tobacco or illicit drugs  Current medications and supplements including opioid prescriptions. Patient is not currently taking opioid prescriptions. Functional ability and status Nutritional status Physical activity Advanced directives List of other physicians Hospitalizations, surgeries, and ER visits in previous 12 months Vitals Screenings to include cognitive, depression, and falls Referrals and appointments  In addition, I have reviewed and discussed with patient certain preventive protocols, quality metrics, and best practice recommendations. A written personalized care plan for preventive services as well as general preventive health recommendations were provided to patient.   Pinky Bright, LPN   03/22/980   After Visit Summary: (MyChart) Due to this being a telephonic visit, the after visit summary with patients personalized plan was offered to patient via MyChart   Notes: Nothing significant to report at this time.

## 2024-04-24 NOTE — Patient Instructions (Addendum)
 George Jensen , Thank you for taking time out of your busy schedule to complete your Annual Wellness Visit with me. I enjoyed our conversation and look forward to speaking with you again next year. I, as well as your care team,  appreciate your ongoing commitment to your health goals. Please review the following plan we discussed and let me know if I can assist you in the future.   Follow up Visits: Next Medicare AWV with our clinical staff:   05/07/25 @ 4:00 PM BY PHONE Have you seen your provider in the last 6 months (3 months if uncontrolled diabetes)? Yes  Clinician Recommendations:  Aim for 30 minutes of exercise or brisk walking, 6-8 glasses of water, and 5 servings of fruits and vegetables each day. TAKE CARE!      This is a list of the screening recommended for you and due dates:  Health Maintenance  Topic Date Due   DTaP/Tdap/Td vaccine (1 - Tdap) Never done   Colon Cancer Screening  Never done   Zoster (Shingles) Vaccine (1 of 2) Never done   COVID-19 Vaccine (5 - 2024-25 season) 07/21/2023   Flu Shot  06/19/2024   Hemoglobin A1C  09/05/2024   Eye exam for diabetics  09/12/2024   Yearly kidney function blood test for diabetes  03/06/2025   Yearly kidney health urinalysis for diabetes  03/13/2025   Complete foot exam   03/13/2025   Screening for Lung Cancer  03/30/2025   Medicare Annual Wellness Visit  04/24/2025   Pneumonia Vaccine  Completed   Hepatitis C Screening  Completed   HPV Vaccine  Aged Out   Meningitis B Vaccine  Aged Out    Advanced directives: (ACP Link)Information on Advanced Care Planning can be found at Insurance account manager Advance Health Care Directives Advance Health Care Directives. [email or web address]  Advance Care Planning is important because it:  [x]  Makes sure you receive the medical care that is consistent with your values, goals, and preferences  [x]  It provides guidance to your family and loved ones and reduces their decisional burden about  whether or not they are making the right decisions based on your wishes.  Follow the link provided in your after visit summary or read over the paperwork we have mailed to you to help you started getting your Advance Directives in place. If you need assistance in completing these, please reach out to us  so that we can help you!

## 2024-08-22 ENCOUNTER — Other Ambulatory Visit: Payer: Self-pay | Admitting: Family Medicine

## 2024-08-22 DIAGNOSIS — I2510 Atherosclerotic heart disease of native coronary artery without angina pectoris: Secondary | ICD-10-CM

## 2024-08-22 DIAGNOSIS — F325 Major depressive disorder, single episode, in full remission: Secondary | ICD-10-CM

## 2024-08-22 DIAGNOSIS — I129 Hypertensive chronic kidney disease with stage 1 through stage 4 chronic kidney disease, or unspecified chronic kidney disease: Secondary | ICD-10-CM

## 2024-08-24 NOTE — Telephone Encounter (Signed)
 Requested Prescriptions  Refused Prescriptions Disp Refills   losartan  (COZAAR ) 25 MG tablet [Pharmacy Med Name: LOSARTAN  POTASSIUM 25 MG Oral Tablet] 90 tablet 3    Sig: TAKE 1 TABLET EVERY DAY     Cardiovascular:  Angiotensin Receptor Blockers Failed - 08/24/2024  2:01 PM      Failed - Cr in normal range and within 180 days    Creat  Date Value Ref Range Status  03/06/2024 1.82 (H) 0.70 - 1.28 mg/dL Final   Creatinine, Urine  Date Value Ref Range Status  03/13/2024 151 20 - 320 mg/dL Final         Passed - K in normal range and within 180 days    Potassium  Date Value Ref Range Status  03/06/2024 4.7 3.5 - 5.3 mmol/L Final         Passed - Patient is not pregnant      Passed - Last BP in normal range    BP Readings from Last 1 Encounters:  03/13/24 122/80         Passed - Valid encounter within last 6 months    Recent Outpatient Visits           5 months ago Type 2 diabetes mellitus with other specified complication, without long-term current use of insulin (HCC)   [place] [facility] [place], Marsa PARAS, DO       Future Appointments             In 2 months Ed[place], Marsa PARAS, DO [place] [facility], [address]             sertraline  (ZOLOFT ) 50 MG tablet [Pharmacy Med Name: SERTRALINE  HCL 50 MG Oral Tablet] 90 tablet 3    Sig: TAKE 1 TABLET EVERY DAY     Psychiatry:  Antidepressants - SSRI - sertraline  Passed - 08/24/2024  2:01 PM      Passed - AST in normal range and within 360 days    AST  Date Value Ref Range Status  10/07/2023 21 10 - 35 U/L Final         Passed - ALT in normal range and within 360 days    ALT  Date Value Ref Range Status  10/07/2023 17 9 - 46 U/L Final         Passed - Completed PHQ-2 or PHQ-9 in the last 360 days      Passed - Valid encounter within last 6 months    Recent Outpatient Visits           5 months ago Type 2 diabetes mellitus with other specified complication,  without long-term current use of insulin (HCC)   [place] [facility] [place], Marsa PARAS, DO       Future Appointments             In 2 months Edman, Marsa PARAS, DO [place] [facility], [address]             metoprolol  tartrate (LOPRESSOR ) 25 MG tablet [Pharmacy Med Name: METOPROLOL  TARTRATE 25 MG Oral Tablet] 180 tablet 3    Sig: TAKE 1 TABLET TWICE DAILY     Cardiovascular:  Beta Blockers Passed - 08/24/2024  2:01 PM      Passed - Last BP in normal range    BP Readings from Last 1 Encounters:  03/13/24 122/80         Passed - Last Heart  Rate in normal range    Pulse Readings from Last 1 Encounters:  03/13/24 (!) 53         Passed - Valid encounter within last 6 months    Recent Outpatient Visits           5 months ago Type 2 diabetes mellitus with other specified complication, without long-term current use of insulin (HCC)   [place] [facility] [place], Marsa PARAS, DO       Future Appointments             In 2 months Edman, Marsa PARAS, DO  [facility], [address]

## 2024-08-29 ENCOUNTER — Other Ambulatory Visit: Payer: Self-pay | Admitting: Family Medicine

## 2024-08-29 DIAGNOSIS — K219 Gastro-esophageal reflux disease without esophagitis: Secondary | ICD-10-CM

## 2024-08-29 DIAGNOSIS — E785 Hyperlipidemia, unspecified: Secondary | ICD-10-CM

## 2024-09-01 NOTE — Telephone Encounter (Signed)
 Early request- patient will run short- appointment scheduled for 12/5. Will give 1 RF Requested Prescriptions  Pending Prescriptions Disp Refills   ezetimibe  (ZETIA ) 10 MG tablet [Pharmacy Med Name: EZETIMIBE  10 MG Oral Tablet] 90 tablet 3    Sig: TAKE 1 TABLET EVERY DAY     Cardiovascular:  Antilipid - Sterol Transport Inhibitors Failed - 09/01/2024 10:58 AM      Failed - Lipid Panel in normal range within the last 12 months    Cholesterol, Total  Date Value Ref Range Status  09/20/2015 132 100 - 199 mg/dL Final   Cholesterol  Date Value Ref Range Status  10/07/2023 115 <200 mg/dL Final   LDL Cholesterol (Calc)  Date Value Ref Range Status  10/07/2023 58 mg/dL (calc) Final    Comment:    Reference range: <100 . Desirable range <100 mg/dL for primary prevention;   <70 mg/dL for patients with CHD or diabetic patients  with > or = 2 CHD risk factors. SABRA LDL-C is now calculated using the Martin-Hopkins  calculation, which is a validated novel method providing  better accuracy than the Friedewald equation in the  estimation of LDL-C.  Gladis APPLETHWAITE et al. SANDREA. 7986;689(80): 2061-2068  (http://education.[facility].com/faq/FAQ164)    HDL  Date Value Ref Range Status  10/07/2023 41 > OR = 40 mg/dL Final  88/98/7983 38 (L) >39 mg/dL Final    Comment:    According to ATP-III Guidelines, HDL-C >59 mg/dL is considered a negative risk factor for CHD.    Triglycerides  Date Value Ref Range Status  10/07/2023 81 <150 mg/dL Final         Passed - AST in normal range and within 360 days    AST  Date Value Ref Range Status  10/07/2023 21 10 - 35 U/L Final         Passed - ALT in normal range and within 360 days    ALT  Date Value Ref Range Status  10/07/2023 17 9 - 46 U/L Final         Passed - Patient is not pregnant      Passed - Valid encounter within last 12 months    Recent Outpatient Visits           5 months ago Type 2 diabetes mellitus with other specified  complication, without long-term current use of insulin (HCC)   [place] [facility] [place], Marsa PARAS, DO       Future Appointments             In 1 month Edman, Marsa PARAS, DO [place] [facility], [address]             atorvastatin  (LIPITOR) 80 MG tablet [Pharmacy Med Name: ATORVASTATIN  CALCIUM  80 MG Oral Tablet] 90 tablet 3    Sig: TAKE 1 TABLET AT BEDTIME     Cardiovascular:  Antilipid - Statins Failed - 09/01/2024 10:58 AM      Failed - Lipid Panel in normal range within the last 12 months    Cholesterol, Total  Date Value Ref Range Status  09/20/2015 132 100 - 199 mg/dL Final   Cholesterol  Date Value Ref Range Status  10/07/2023 115 <200 mg/dL Final   LDL Cholesterol (Calc)  Date Value Ref Range Status  10/07/2023 58 mg/dL (calc) Final    Comment:    Reference range: <100 . Desirable range <100 mg/dL for primary prevention;   <70 mg/dL for patients with  CHD or diabetic patients  with > or = 2 CHD risk factors. SABRA LDL-C is now calculated using the Martin-Hopkins  calculation, which is a validated novel method providing  better accuracy than the Friedewald equation in the  estimation of LDL-C.  Gladis APPLETHWAITE et al. SANDREA. 7986;689(80): 2061-2068  (http://education.[facility].com/faq/FAQ164)    HDL  Date Value Ref Range Status  10/07/2023 41 > OR = 40 mg/dL Final  88/98/7983 38 (L) >39 mg/dL Final    Comment:    According to ATP-III Guidelines, HDL-C >59 mg/dL is considered a negative risk factor for CHD.    Triglycerides  Date Value Ref Range Status  10/07/2023 81 <150 mg/dL Final         Passed - Patient is not pregnant      Passed - Valid encounter within last 12 months    Recent Outpatient Visits           5 months ago Type 2 diabetes mellitus with other specified complication, without long-term current use of insulin [facility])   [place] [facility] [place], Marsa PARAS,  DO       Future Appointments             In 1 month Edman, Marsa PARAS, DO [place], [address]             omeprazole  (PRILOSEC) 40 MG capsule [Pharmacy Med Name: OMEPRAZOLE  40 MG Oral Capsule Delayed Release] 90 capsule 3    Sig: TAKE 1 CAPSULE (40 MG TOTAL) BY MOUTH DAILY.     Gastroenterology: Proton Pump Inhibitors Passed - 09/01/2024 10:58 AM      Passed - Valid encounter within last 12 months    Recent Outpatient Visits           5 months ago Type 2 diabetes mellitus with other specified complication, without long-term current use of insulin [facility])   [place] [facility] [place], Marsa PARAS, DO       Future Appointments             In 1 month Edman, Marsa PARAS, DO [place] [facility], [address]

## 2024-09-18 DIAGNOSIS — E119 Type 2 diabetes mellitus without complications: Secondary | ICD-10-CM | POA: Diagnosis not present

## 2024-09-18 DIAGNOSIS — H35363 Drusen (degenerative) of macula, bilateral: Secondary | ICD-10-CM | POA: Diagnosis not present

## 2024-09-18 DIAGNOSIS — Z961 Presence of intraocular lens: Secondary | ICD-10-CM | POA: Diagnosis not present

## 2024-09-18 DIAGNOSIS — H33191 Other retinoschisis and retinal cysts, right eye: Secondary | ICD-10-CM | POA: Diagnosis not present

## 2024-09-18 DIAGNOSIS — H43392 Other vitreous opacities, left eye: Secondary | ICD-10-CM | POA: Diagnosis not present

## 2024-09-18 LAB — OPHTHALMOLOGY REPORT-SCANNED

## 2024-10-05 ENCOUNTER — Ambulatory Visit: Payer: Self-pay

## 2024-10-05 NOTE — Telephone Encounter (Signed)
 FYI Only or Action Required?: Action required by provider: request for appointment.  Patient was last seen in primary care on 03/13/2024 by Edman Marsa PARAS, DO.  Called Nurse Triage reporting Dysuria.  Symptoms began several days ago.  Interventions attempted: Nothing.  Symptoms are: gradually worsening.  Triage Disposition: See Physician Within 24 Hours  Patient/caregiver understands and will follow disposition?: Yes, but will wait                                 Reason for Triage: Patient's wife, Mrs. Ebers CB [phone], is calling to adv that patient is having slight burning with urination, caller is req that med that was prev prescribed for this be prescribed again, according to wife this med was prev prescribed back in 07/2022 or 09/2022. Patient's preferred pharmacy is [id number] - [place], [place] - [address] [place] [address] Phone: [phone] Fax: [phone]  Reason for Disposition  All other males with painful urination  Answer Assessment - Initial Assessment Questions 1. SEVERITY: How bad is the pain?  (e.g., Scale 1-10; mild, moderate, or severe)     Mild  2. FREQUENCY: How many times have you had painful urination today?      N/A 3. PATTERN: Is pain present every time you urinate or just sometimes?      Every time 4. ONSET: When did the painful urination start?      Last Friday  5. FEVER: Do you have a fever? If Yes, ask: What is your temperature, how was it measured, and when did it start?     Denies 6. PAST UTI: Have you had a urine infection before? If Yes, ask: When was the last time? and What happened that time?      Yes, over 2 years ago  7. CAUSE: What do you think is causing the painful urination?      UTI 8. OTHER SYMPTOMS: Do you have any other symptoms? (e.g., flank pain, penis discharge, scrotal pain, blood in urine)     Denies hematuria, denies abdominal pain,  denies discharge, back pain at baseline    This RN spoke to patient's wife, Merlynn. Wife is not on DPR, so this RN was careful not to release any medical information. This RN advised evaluation within 24 hours. No availability with PCP until Thursday. Patient's wife declined sooner available appointments with alternate providers in PCP office. Wife stated patient would only want to see PCP. Please advise. Wife is requesting a call back.  Protocols used: Urination Pain - Male-A-AH

## 2024-10-05 NOTE — Telephone Encounter (Signed)
 Spoke to George Jensen appointment scheduled for tomorrow 11/18 at 11am

## 2024-10-06 ENCOUNTER — Ambulatory Visit: Admitting: Family Medicine

## 2024-10-06 ENCOUNTER — Encounter: Payer: Self-pay | Admitting: Family Medicine

## 2024-10-06 VITALS — BP 122/80 | HR 70 | Ht 71.0 in | Wt 230.0 lb

## 2024-10-06 DIAGNOSIS — N3001 Acute cystitis with hematuria: Secondary | ICD-10-CM | POA: Diagnosis not present

## 2024-10-06 DIAGNOSIS — B379 Candidiasis, unspecified: Secondary | ICD-10-CM | POA: Diagnosis not present

## 2024-10-06 DIAGNOSIS — R300 Dysuria: Secondary | ICD-10-CM

## 2024-10-06 LAB — POCT URINALYSIS DIPSTICK
Bilirubin, UA: NEGATIVE
Glucose, UA: NEGATIVE
Nitrite, UA: NEGATIVE
Protein, UA: POSITIVE — AB
Spec Grav, UA: 1.01 (ref 1.010–1.025)
Urobilinogen, UA: 0.2 U/dL
pH, UA: 6 (ref 5.0–8.0)

## 2024-10-06 MED ORDER — FLUCONAZOLE 150 MG PO TABS: ORAL_TABLET | ORAL | 0 refills | Status: DC

## 2024-10-06 MED ORDER — CEPHALEXIN 500 MG PO CAPS: 500.0000 mg | ORAL_CAPSULE | Freq: Three times a day (TID) | ORAL | 0 refills | Status: DC

## 2024-10-06 NOTE — Patient Instructions (Addendum)
Thank you for coming to the office today.  1. You have a Urinary Tract Infection - this is very common, your symptoms are reassuring and you should get better within 1 week on the antibiotics - Start Keflex 500mg 3 times daily for next 7 days, complete entire course, even if feeling better - We sent urine for a culture, we will call you within next few days if we need to change antibiotics - Please drink plenty of fluids, improve hydration over next 1 week  If symptoms worsening, developing nausea / vomiting, worsening back pain, fevers / chills / sweats, then please return for re-evaluation sooner.  If you take AZO OTC - limit this to 2-3 days MAX to avoid affecting kidneys  D-Mannose is a natural supplement that can actually help bind to urinary bacteria and reduce their effectiveness it can help prevent UTI from forming, and may reduce some symptoms. It likely cannot cure an active UTI but it is worth a try and good to prevent them with. Try 500mg twice a day at a full dose if you want, or check package instructions for more info   Please schedule a Follow-up Appointment to: Return if symptoms worsen or fail to improve.  If you have any other questions or concerns, please feel free to call the office or send a message through MyChart. You may also schedule an earlier appointment if necessary.  Additionally, you may be receiving a survey about your experience at our office within a few days to 1 week by e-mail or mail. We value your feedback.  Nikoleta Dady, DO [facility] 

## 2024-10-06 NOTE — Progress Notes (Signed)
 Subjective:    Patient ID: George Jensen, male    DOB: [birth year], 78 y.o.   MRN: [id number]  George Jensen is a 78 y.o. male presenting on 10/06/2024 for Urinary Tract Infection  Patient presents for a same day appointment.  HPI  Discussed the use of AI scribe software for clinical note transcription with the patient, who gave verbal consent to proceed.  History of Present Illness   George Jensen is a 78 year old male who presents with symptoms of a urinary tract infection.  Dysuria and urinary tract symptoms - Burning sensation during urination for several days - Intermittent relief of symptoms without complete resolution - No history of yeast infections - Previously prescribed anti-yeast medication as a precaution during prior urinary tract infection treatment  History of urinary tract infection - Similar urinary tract infection in September 2023 - Treated effectively with Keflex            04/24/2024    4:00 PM 03/13/2024    9:31 AM 10/15/2023    1:39 PM  Depression screen PHQ 2/9  Decreased Interest 0 0 0  Down, Depressed, Hopeless 0 0 0  PHQ - 2 Score 0 0 0  Altered sleeping 0 0 0  Tired, decreased energy 0 0 0  Change in appetite 0 0 0  Feeling bad or failure about yourself  0 0 0  Trouble concentrating 0 0 0  Moving slowly or fidgety/restless 0 0 0  Suicidal thoughts 0 0 0  PHQ-9 Score 0  0  0   Difficult doing work/chores Not difficult at all Not difficult at all Not difficult at all     Data saved with a previous flowsheet row definition       03/13/2024    9:31 AM 10/15/2023    1:40 PM 04/05/2023    9:35 AM 10/05/2022    9:10 AM  GAD 7 : Generalized Anxiety Score  Nervous, Anxious, on Edge 0 0 0 0  Control/stop worrying 0 0 0 0  Worry too much - different things 0 0 0 0  Trouble relaxing 0 0 0 0  Restless 0 0 0 0  Easily annoyed or irritable 0 0 0 0  Afraid - awful might happen 0 0 0 0  Total GAD 7 Score 0 0 0 0  Anxiety Difficulty Not  difficult at all Not difficult at all  Not difficult at all    Social History   Tobacco Use   Smoking status: Former    Current packs/day: 0.00    Types: Cigarettes    Quit date: 10/19/2014    Years since quitting: 9.9   Smokeless tobacco: Former  Building Services Engineer status: Never Used  Substance Use Topics   Alcohol use: No   Drug use: No    Review of Systems Per HPI unless specifically indicated above     Objective:    BP 122/80 (BP Location: Left Arm, Patient Position: Sitting, Cuff Size: Normal)   Pulse 70   Ht 5' 11 (1.803 m)   Wt 230 lb (104.3 kg)   BMI 32.08 kg/m   Wt Readings from Last 3 Encounters:  10/06/24 230 lb (104.3 kg)  03/13/24 233 lb 3.2 oz (105.8 kg)  10/15/23 236 lb 3.2 oz (107.1 kg)    Physical Exam Vitals and nursing note reviewed.  Constitutional:      General: He is not in acute distress.    Appearance: Normal appearance.  He is well-developed. He is not diaphoretic.     Comments: Well-appearing, comfortable, cooperative  HENT:     Head: Normocephalic and atraumatic.  Eyes:     General:        Right eye: No discharge.        Left eye: No discharge.     Conjunctiva/sclera: Conjunctivae normal.  Cardiovascular:     Rate and Rhythm: Normal rate.  Pulmonary:     Effort: Pulmonary effort is normal.  Skin:    General: Skin is warm and dry.     Findings: No erythema or rash.  Neurological:     Mental Status: He is alert and oriented to person, place, and time.  Psychiatric:        Mood and Affect: Mood normal.        Behavior: Behavior normal.        Thought Content: Thought content normal.     Comments: Well groomed, good eye contact, normal speech and thoughts     Results for orders placed or performed in visit on 10/06/24  POCT Urinalysis Dipstick   Collection Time: 10/06/24 11:53 AM  Result Value Ref Range   Color, UA     Clarity, UA cloudy    Glucose, UA Negative Negative   Bilirubin, UA negative    Ketones, UA small     Spec Grav, UA 1.010 1.010 - 1.025   Blood, UA moderate    pH, UA 6.0 5.0 - 8.0   Protein, UA Positive (A) Negative   Urobilinogen, UA 0.2 0.2 or 1.0 E.U./dL   Nitrite, UA negative    Leukocytes, UA Moderate (2+) (A) Negative   Appearance     Odor        Assessment & Plan:   Problem List Items Addressed This Visit   None Visit Diagnoses       Acute cystitis with hematuria    -  Primary   Relevant Medications   cephALEXin  (KEFLEX ) 500 MG capsule     Dysuria       Relevant Orders   POCT Urinalysis Dipstick (Completed)   Urine Culture     Antibiotic-induced yeast infection       Relevant Medications   cephALEXin  (KEFLEX ) 500 MG capsule   fluconazole  (DIFLUCAN ) 150 MG tablet        Acute urinary tract infection with dysuria Acute UTI confirmed by urinalysis. History of DM, Previous UTIs treated with Keflex . Potential contributing factors include age, diet, and blood sugar levels. Last UTI 92/2023 resolved w keflex  - Prescribed Keflex  500 mg, one capsule TID for 7 days. - Sent urine sample for culture. - Advised to contact if further test results are available.  Candidiasis prophylaxis Prophylactic treatment for potential yeast infection due to antibiotic use. - Prescribed anti-yeast medication as prophylaxis.        Orders Placed This Encounter  Procedures   Urine Culture   POCT Urinalysis Dipstick    Meds ordered this encounter  Medications   cephALEXin  (KEFLEX ) 500 MG capsule    Sig: Take 1 capsule (500 mg total) by mouth 3 (three) times daily. For 7 days    Dispense:  21 capsule    Refill:  0   fluconazole  (DIFLUCAN ) 150 MG tablet    Sig: Take one tablet by mouth on Day 1. Repeat dose 2nd tablet on Day 3.    Dispense:  2 tablet    Refill:  0    Follow up plan: Return if  symptoms worsen or fail to improve.   Marsa Officer, DO [facility] 10/06/2024, 11:51 AM

## 2024-10-07 LAB — URINE CULTURE
MICRO NUMBER:: 17251052
Result:: NO GROWTH
SPECIMEN QUALITY:: ADEQUATE

## 2024-10-08 ENCOUNTER — Ambulatory Visit: Payer: Self-pay | Admitting: Family Medicine

## 2024-10-14 ENCOUNTER — Other Ambulatory Visit: Payer: Self-pay

## 2024-10-20 ENCOUNTER — Encounter: Admitting: Family Medicine

## 2024-10-21 ENCOUNTER — Encounter: Payer: Self-pay | Admitting: Family Medicine

## 2024-10-23 ENCOUNTER — Other Ambulatory Visit

## 2024-10-23 DIAGNOSIS — E785 Hyperlipidemia, unspecified: Secondary | ICD-10-CM

## 2024-10-23 DIAGNOSIS — E1169 Type 2 diabetes mellitus with other specified complication: Secondary | ICD-10-CM

## 2024-10-23 DIAGNOSIS — N1831 Chronic kidney disease, stage 3a: Secondary | ICD-10-CM

## 2024-10-23 DIAGNOSIS — Z0000 Encounter for general adult medical examination without abnormal findings: Secondary | ICD-10-CM

## 2024-10-23 DIAGNOSIS — I2510 Atherosclerotic heart disease of native coronary artery without angina pectoris: Secondary | ICD-10-CM

## 2024-10-23 DIAGNOSIS — N138 Other obstructive and reflux uropathy: Secondary | ICD-10-CM

## 2024-10-23 DIAGNOSIS — I129 Hypertensive chronic kidney disease with stage 1 through stage 4 chronic kidney disease, or unspecified chronic kidney disease: Secondary | ICD-10-CM

## 2024-10-24 LAB — COMPREHENSIVE METABOLIC PANEL WITH GFR
AG Ratio: 1.5 (calc) (ref 1.0–2.5)
ALT: 23 U/L (ref 9–46)
AST: 26 U/L (ref 10–35)
Albumin: 3.8 g/dL (ref 3.6–5.1)
Alkaline phosphatase (APISO): 76 U/L (ref 35–144)
BUN/Creatinine Ratio: 15 (calc) (ref 6–22)
BUN: 28 mg/dL — ABNORMAL HIGH (ref 7–25)
CO2: 25 mmol/L (ref 20–32)
Calcium: 8.8 mg/dL (ref 8.6–10.3)
Chloride: 108 mmol/L (ref 98–110)
Creat: 1.9 mg/dL — ABNORMAL HIGH (ref 0.70–1.28)
Globulin: 2.6 g/dL (ref 1.9–3.7)
Glucose, Bld: 114 mg/dL — ABNORMAL HIGH (ref 65–99)
Potassium: 5 mmol/L (ref 3.5–5.3)
Sodium: 141 mmol/L (ref 135–146)
Total Bilirubin: 0.6 mg/dL (ref 0.2–1.2)
Total Protein: 6.4 g/dL (ref 6.1–8.1)
eGFR: 36 mL/min/1.73m2 — ABNORMAL LOW (ref >=60)

## 2024-10-24 LAB — HEMOGLOBIN A1C
Hgb A1c MFr Bld: 6.5 % — ABNORMAL HIGH (ref <5.7)
Mean Plasma Glucose: 140 mg/dL
eAG (mmol/L): 7.7 mmol/L

## 2024-10-24 LAB — CBC WITH DIFFERENTIAL/PLATELET
Absolute Lymphocytes: 1369 {cells}/uL (ref 850–3900)
Absolute Monocytes: 697 {cells}/uL (ref 200–950)
Basophils Absolute: 32 {cells}/uL (ref 0–200)
Basophils Relative: 0.4 %
Eosinophils Absolute: 300 {cells}/uL (ref 15–500)
Eosinophils Relative: 3.7 %
HCT: 39.8 % (ref 39.4–51.1)
Hemoglobin: 12.8 g/dL — ABNORMAL LOW (ref 13.2–17.1)
MCH: 29.9 pg (ref 27.0–33.0)
MCHC: 32.2 g/dL (ref 31.6–35.4)
MCV: 93 fL (ref 81.4–101.7)
MPV: 10.6 fL (ref 7.5–12.5)
Monocytes Relative: 8.6 %
Neutro Abs: 5702 {cells}/uL (ref 1500–7800)
Neutrophils Relative %: 70.4 %
Platelets: 176 Thousand/uL (ref 140–400)
RBC: 4.28 Million/uL (ref 4.20–5.80)
RDW: 13 % (ref 11.0–15.0)
Total Lymphocyte: 16.9 %
WBC: 8.1 Thousand/uL (ref 3.8–10.8)

## 2024-10-24 LAB — PSA: PSA: 0.83 ng/mL (ref <=4.00)

## 2024-10-24 LAB — LIPID PANEL
Cholesterol: 104 mg/dL (ref <200)
HDL: 37 mg/dL — ABNORMAL LOW (ref >=40)
LDL Cholesterol (Calc): 53 mg/dL
Non-HDL Cholesterol (Calc): 67 mg/dL (ref <130)
Total CHOL/HDL Ratio: 2.8 (calc) (ref <5.0)
Triglycerides: 65 mg/dL (ref <150)

## 2024-10-24 LAB — TSH: TSH: 2.08 m[IU]/L (ref 0.40–4.50)

## 2024-10-30 ENCOUNTER — Ambulatory Visit: Admitting: Family Medicine

## 2024-10-30 ENCOUNTER — Encounter: Payer: Self-pay | Admitting: Family Medicine

## 2024-10-30 VITALS — BP 124/70 | HR 55 | Ht 71.0 in | Wt 223.1 lb

## 2024-10-30 DIAGNOSIS — I129 Hypertensive chronic kidney disease with stage 1 through stage 4 chronic kidney disease, or unspecified chronic kidney disease: Secondary | ICD-10-CM | POA: Diagnosis not present

## 2024-10-30 DIAGNOSIS — N183 Chronic kidney disease, stage 3 unspecified: Secondary | ICD-10-CM

## 2024-10-30 DIAGNOSIS — J439 Emphysema, unspecified: Secondary | ICD-10-CM

## 2024-10-30 DIAGNOSIS — F325 Major depressive disorder, single episode, in full remission: Secondary | ICD-10-CM

## 2024-10-30 DIAGNOSIS — N401 Enlarged prostate with lower urinary tract symptoms: Secondary | ICD-10-CM

## 2024-10-30 DIAGNOSIS — K219 Gastro-esophageal reflux disease without esophagitis: Secondary | ICD-10-CM

## 2024-10-30 DIAGNOSIS — Z0000 Encounter for general adult medical examination without abnormal findings: Secondary | ICD-10-CM

## 2024-10-30 DIAGNOSIS — E1121 Type 2 diabetes mellitus with diabetic nephropathy: Secondary | ICD-10-CM | POA: Diagnosis not present

## 2024-10-30 DIAGNOSIS — N138 Other obstructive and reflux uropathy: Secondary | ICD-10-CM

## 2024-10-30 DIAGNOSIS — E785 Hyperlipidemia, unspecified: Secondary | ICD-10-CM

## 2024-10-30 DIAGNOSIS — I2510 Atherosclerotic heart disease of native coronary artery without angina pectoris: Secondary | ICD-10-CM | POA: Diagnosis not present

## 2024-10-30 DIAGNOSIS — N1832 Chronic kidney disease, stage 3b: Secondary | ICD-10-CM

## 2024-10-30 DIAGNOSIS — Z23 Encounter for immunization: Secondary | ICD-10-CM | POA: Diagnosis not present

## 2024-10-30 MED ORDER — EZETIMIBE 10 MG PO TABS: 10.0000 mg | ORAL_TABLET | Freq: Every day | ORAL | 3 refills | Status: AC

## 2024-10-30 MED ORDER — LOSARTAN POTASSIUM 25 MG PO TABS: 25.0000 mg | ORAL_TABLET | Freq: Every day | ORAL | 3 refills | Status: AC

## 2024-10-30 MED ORDER — OMEPRAZOLE 40 MG PO CPDR: 40.0000 mg | DELAYED_RELEASE_CAPSULE | Freq: Every day | ORAL | 3 refills | Status: AC

## 2024-10-30 MED ORDER — METOPROLOL TARTRATE 25 MG PO TABS: 25.0000 mg | ORAL_TABLET | Freq: Two times a day (BID) | ORAL | 3 refills | Status: AC

## 2024-10-30 MED ORDER — SERTRALINE HCL 50 MG PO TABS: 50.0000 mg | ORAL_TABLET | Freq: Every day | ORAL | 3 refills | Status: AC

## 2024-10-30 MED ORDER — ATORVASTATIN CALCIUM 80 MG PO TABS: 80.0000 mg | ORAL_TABLET | Freq: Every day | ORAL | 3 refills | Status: AC

## 2024-10-30 NOTE — Progress Notes (Signed)
 Subjective:    Patient ID: George Jensen, male    DOB: 1946-08-21, 78 y.o.   MRN: [id number]  George Jensen is a 78 y.o. male presenting on 10/30/2024 for Annual Exam   HPI  Discussed the use of AI scribe software for clinical note transcription with the patient, who gave verbal consent to proceed.  History of Present Illness   George Jensen is a 78 year old male who presents for an annual physical exam.   BPH Lower urinary tract symptoms - Frequent urination, possibly related to prostatic hypertrophy - Previously trialed tamsulosin  (Flomax ) but discontinued due to dizziness - Currently not taking medication for prostate symptoms  Cardiovascular disease - History of coronary artery disease with stent placement - Under care of a cardiologist  Type 2 Diabetes - A1c improved from 6.9 to 6.5 Meds: No medication (never on med) Currently on ARB Goal to improve exercise - Taking ASA 81mg , Statin DM Eye followed by [facility] Denies hypoglycemia, polyuria, visual changes, numbness or tingling.   CHRONIC HTN / CKD-III b Known chronic CKD-III b last lab with eGFR < 40 now at 36 and Cr 1.9 Worsening kidney function Poor hydration still, some elevated BUN concern on lab Current Meds - Metoprolol  25mg  BID, Losartan  25mg  Reports good compliance, took meds today. Tolerating well, w/o complaints. Denies CP, dyspnea, HA, edema, dizziness / lightheadedness    Panlobular Emphysema COPD, Former Smoker Followed by Dr Theotis, good report recently with doing very well on PFTs On Spiriva - Rarely using Albuterol  PRN   HYPERLIPIDEMIA: - Reports no concerns. Last lipid panel 10/2024 LDL 53 - Currently taking Zetia  and Atorvastatin , tolerating well without side effects or myalgias   Major Depression in Remission On Sertraline  50mg  daily    BPH LUTS No longer able to tolerate Tamsulosin  for BPH symptoms. Due to side effects. Off medication, had dizziness PSA at 0.83, from  1.37   Constipation Failed Miralax, Colace, Amitiza  Uses Ex-lax AS NEEDED    Health Maintenance  Flu Shot today  Updated Prevnar 20 previously     10/31/2024   12:52 AM 04/24/2024    4:00 PM 03/13/2024    9:31 AM  Depression screen PHQ 2/9  Decreased Interest 0 0 0  Down, Depressed, Hopeless 0 0 0  PHQ - 2 Score 0 0 0  Altered sleeping 0 0 0  Tired, decreased energy 0 0 0  Change in appetite 0 0 0  Feeling bad or failure about yourself  0 0 0  Trouble concentrating 0 0 0  Moving slowly or fidgety/restless 0 0 0  Suicidal thoughts 0 0 0  PHQ-9 Score 0 0  0   Difficult doing work/chores Not difficult at all Not difficult at all Not difficult at all     Data saved with a previous flowsheet row definition       03/13/2024    9:31 AM 10/15/2023    1:40 PM 04/05/2023    9:35 AM 10/05/2022    9:10 AM  GAD 7 : Generalized Anxiety Score  Nervous, Anxious, on Edge 0 0 0 0  Control/stop worrying 0 0 0 0  Worry too much - different things 0 0 0 0  Trouble relaxing 0 0 0 0  Restless 0 0 0 0  Easily annoyed or irritable 0 0 0 0  Afraid - awful might happen 0 0 0 0  Total GAD 7 Score 0 0 0 0  Anxiety Difficulty Not difficult  at all Not difficult at all  Not difficult at all     Past Medical History:  Diagnosis Date   BCC (basal cell carcinoma)    x 2 on face   COPD (chronic obstructive pulmonary disease) (HCC)    Hyperlipidemia    Hypertension    Melanoma (HCC)    Myocardial infarction (HCC) 10/2014   SCCA (squamous cell carcinoma) of skin    SCC x 1 on face   Past Surgical History:  Procedure Laterality Date   CAROTID STENT  10/2014   3 STENTS [facility]   MELANOMA EXCISION  1982   Social History   Socioeconomic History   Marital status: Married    Spouse name: George Jensen   Number of children: 4   Years of education: Not on file   Highest education level: 9th grade  Occupational History   Occupation: Heavy Media Planner  Tobacco Use   Smoking  status: Former    Current packs/day: 0.00    Average packs/day: 2.0 packs/day    Types: Cigarettes    Quit date: 10/19/2014    Years since quitting: 10.0   Smokeless tobacco: Former  Building Services Engineer status: Never Used  Substance and Sexual Activity   Alcohol use: No   Drug use: No   Sexual activity: Not on file  Other Topics Concern   Not on file  Social History Narrative   Not on file   Social Drivers of Health   Tobacco Use: Medium Risk (10/30/2024)   Patient History    Smoking Tobacco Use: Former    Smokeless Tobacco Use: Former    Passive Exposure: Not on Actuary Strain: Low Risk (04/24/2024)   Overall Financial Resource Strain (CARDIA)    Difficulty of Paying Living Expenses: Not hard at all  Food Insecurity: No Food Insecurity (04/24/2024)   Hunger Vital Sign    Worried About Running Out of Food in the Last Year: Never true    Ran Out of Food in the Last Year: Never true  Transportation Needs: No Transportation Needs (04/24/2024)   PRAPARE - Administrator, Civil Service (Medical): No    Lack of Transportation (Non-Medical): No  Physical Activity: Insufficiently Active (04/24/2024)   Exercise Vital Sign    Days of Exercise per Week: 7 days    Minutes of Exercise per Session: 20 min  Stress: No Stress Concern Present (04/24/2024)   [employer] of Occupational Health - Occupational Stress Questionnaire    Feeling of Stress : Not at all  Social Connections: Socially Isolated (04/24/2024)   Social Connection and Isolation Panel    Frequency of Communication with Friends and Family: Twice a week    Frequency of Social Gatherings with Friends and Family: Never    Attends Religious Services: Never    Database Administrator or Organizations: No    Attends Banker Meetings: Never    Marital Status: Married  Catering Manager Violence: Not At Risk (04/24/2024)   Humiliation, Afraid, Rape, and Kick questionnaire    Fear of Current or  Ex-Partner: No    Emotionally Abused: No    Physically Abused: No    Sexually Abused: No  Depression (PHQ2-9): Low Risk (10/31/2024)   Depression (PHQ2-9)    PHQ-2 Score: 0  Alcohol Screen: Low Risk (04/24/2024)   Alcohol Screen    Last Alcohol Screening Score (AUDIT): 0  Housing: Unknown (04/24/2024)   Housing Stability Vital Sign  Unable to Pay for Housing in the Last Year: No    Number of Times Moved in the Last Year: Not on file    Homeless in the Last Year: No  Utilities: Not At Risk (04/24/2024)   AHC Utilities    Threatened with loss of utilities: No  Health Literacy: Adequate Health Literacy (04/24/2024)   B1300 Health Literacy    Frequency of need for help with medical instructions: Never   Family History  Problem Relation Age of Onset   Alzheimer's disease Mother    Diabetes Father    Heart disease Father    Current Outpatient Medications on File Prior to Visit  Medication Sig   albuterol  (PROVENTIL  HFA;VENTOLIN  HFA) 108 (90 BASE) MCG/ACT inhaler Inhale 2 puffs into the lungs every 4 (four) hours as needed for wheezing or shortness of breath (or shortness of breath).   aspirin 81 MG EC tablet TAKE 1 TABLET EVERY DAY   lubiprostone  (AMITIZA ) 24 MCG capsule TAKE 1 CAPSULE (24 MCG TOTAL) BY MOUTH 2 (TWO) TIMES DAILY WITH A MEAL.   Multiple Vitamins-Minerals (MULTIVITAMIN MEN 50+) TABS Take daily by mouth.   nitroGLYCERIN (NITROSTAT) 0.4 MG SL tablet Place 0.4 mg under the tongue every 5 (five) minutes as needed for chest pain. AS NEEDED   tiotropium (SPIRIVA) 18 MCG inhalation capsule Place 18 mcg into inhaler and inhale every other day.    No current facility-administered medications on file prior to visit.    Review of Systems  Constitutional:  Negative for activity change, appetite change, chills, diaphoresis, fatigue and fever.  HENT:  Negative for congestion and hearing loss.   Eyes:  Negative for visual disturbance.  Respiratory:  Negative for cough, chest  tightness, shortness of breath and wheezing.   Cardiovascular:  Negative for chest pain, palpitations and leg swelling.  Gastrointestinal:  Negative for abdominal pain, constipation, diarrhea, nausea and vomiting.  Genitourinary:  Negative for dysuria, frequency and hematuria.  Musculoskeletal:  Negative for arthralgias and neck pain.  Skin:  Negative for rash.  Neurological:  Negative for dizziness, weakness, light-headedness, numbness and headaches.  Hematological:  Negative for adenopathy.  Psychiatric/Behavioral:  Negative for behavioral problems, dysphoric mood and sleep disturbance.    Per HPI unless specifically indicated above     Objective:    BP 124/70 (BP Location: Left Arm, Patient Position: Sitting, Cuff Size: Normal)   Pulse (!) 55   Ht 5' 11 (1.803 m)   Wt 223 lb 2 oz (101.2 kg)   SpO2 96%   BMI 31.12 kg/m   Wt Readings from Last 3 Encounters:  10/30/24 223 lb 2 oz (101.2 kg)  10/06/24 230 lb (104.3 kg)  03/13/24 233 lb 3.2 oz (105.8 kg)    Physical Exam Vitals and nursing note reviewed.  Constitutional:      General: He is not in acute distress.    Appearance: He is well-developed. He is not diaphoretic.     Comments: Well-appearing, comfortable, cooperative  HENT:     Head: Normocephalic and atraumatic.  Eyes:     General:        Right eye: No discharge.        Left eye: No discharge.     Conjunctiva/sclera: Conjunctivae normal.     Pupils: Pupils are equal, round, and reactive to light.  Neck:     Thyroid: No thyromegaly.  Cardiovascular:     Rate and Rhythm: Normal rate and regular rhythm.     Pulses: Normal pulses.  Heart sounds: Normal heart sounds. No murmur heard. Pulmonary:     Effort: Pulmonary effort is normal. No respiratory distress.     Breath sounds: Normal breath sounds. No wheezing or rales.  Abdominal:     General: Bowel sounds are normal. There is no distension.     Palpations: Abdomen is soft. There is no mass.      Tenderness: There is no abdominal tenderness.  Musculoskeletal:        General: No tenderness. Normal range of motion.     Cervical back: Normal range of motion and neck supple.     Comments: Upper / Lower Extremities: - Normal muscle tone, strength bilateral upper extremities 5/5, lower extremities 5/5  Lymphadenopathy:     Cervical: No cervical adenopathy.  Skin:    General: Skin is warm and dry.     Findings: No erythema or rash.  Neurological:     Mental Status: He is alert and oriented to person, place, and time.     Comments: Distal sensation intact to light touch all extremities  Psychiatric:        Mood and Affect: Mood normal.        Behavior: Behavior normal.        Thought Content: Thought content normal.     Comments: Well groomed, good eye contact, normal speech and thoughts       Results for orders placed or performed in visit on 10/23/24  Comprehensive metabolic panel with GFR   Collection Time: 10/23/24  8:16 AM  Result Value Ref Range   Glucose, Bld 114 (H) 65 - 99 mg/dL   BUN 28 (H) 7 - 25 mg/dL   Creat 8.09 (H) 9.29 - 1.28 mg/dL   eGFR 36 (L) > OR = 60 mL/min/1.49m2   BUN/Creatinine Ratio 15 6 - 22 (calc)   Sodium 141 135 - 146 mmol/L   Potassium 5.0 3.5 - 5.3 mmol/L   Chloride 108 98 - 110 mmol/L   CO2 25 20 - 32 mmol/L   Calcium  8.8 8.6 - 10.3 mg/dL   Total Protein 6.4 6.1 - 8.1 g/dL   Albumin 3.8 3.6 - 5.1 g/dL   Globulin 2.6 1.9 - 3.7 g/dL (calc)   AG Ratio 1.5 1.0 - 2.5 (calc)   Total Bilirubin 0.6 0.2 - 1.2 mg/dL   Alkaline phosphatase (APISO) 76 35 - 144 U/L   AST 26 10 - 35 U/L   ALT 23 9 - 46 U/L  TSH   Collection Time: 10/23/24  8:16 AM  Result Value Ref Range   TSH 2.08 0.40 - 4.50 mIU/L  PSA   Collection Time: 10/23/24  8:16 AM  Result Value Ref Range   PSA 0.83 < OR = 4.00 ng/mL  CBC with Differential/Platelet   Collection Time: 10/23/24  8:16 AM  Result Value Ref Range   WBC 8.1 3.8 - 10.8 Thousand/uL   RBC 4.28 4.20 - 5.80  Million/uL   Hemoglobin 12.8 (L) 13.2 - 17.1 g/dL   HCT 60.1 60.5 - 48.8 %   MCV 93.0 81.4 - 101.7 fL   MCH 29.9 27.0 - 33.0 pg   MCHC 32.2 31.6 - 35.4 g/dL   RDW 86.9 88.9 - 84.9 %   Platelets 176 140 - 400 Thousand/uL   MPV 10.6 7.5 - 12.5 fL   Neutro Abs 5,702 1,500 - 7,800 cells/uL   Absolute Lymphocytes 1,369 850 - 3,900 cells/uL   Absolute Monocytes 697 200 - 950 cells/uL  Eosinophils Absolute 300 15 - 500 cells/uL   Basophils Absolute 32 0 - 200 cells/uL   Neutrophils Relative % 70.4 %   Total Lymphocyte 16.9 %   Monocytes Relative 8.6 %   Eosinophils Relative 3.7 %   Basophils Relative 0.4 %  Hemoglobin A1c   Collection Time: 10/23/24  8:16 AM  Result Value Ref Range   Hgb A1c MFr Bld 6.5 (H) <5.7 %   Mean Plasma Glucose 140 mg/dL   eAG (mmol/L) 7.7 mmol/L  Lipid panel   Collection Time: 10/23/24  8:16 AM  Result Value Ref Range   Cholesterol 104 <200 mg/dL   HDL 37 (L) > OR = 40 mg/dL   Triglycerides 65 <849 mg/dL   LDL Cholesterol (Calc) 53 mg/dL (calc)   Total CHOL/HDL Ratio 2.8 <5.0 (calc)   Non-HDL Cholesterol (Calc) 67 <869 mg/dL (calc)      Assessment & Plan:   Problem List Items Addressed This Visit     Benign hypertension with CKD (chronic kidney disease) stage III (HCC)   Relevant Medications   metoprolol  tartrate (LOPRESSOR ) 25 MG tablet   losartan  (COZAAR ) 25 MG tablet   ezetimibe  (ZETIA ) 10 MG tablet   atorvastatin  (LIPITOR) 80 MG tablet   BPH with obstruction/lower urinary tract symptoms   CAD, multiple vessel   Relevant Medications   metoprolol  tartrate (LOPRESSOR ) 25 MG tablet   losartan  (COZAAR ) 25 MG tablet   ezetimibe  (ZETIA ) 10 MG tablet   atorvastatin  (LIPITOR) 80 MG tablet   CKD (chronic kidney disease), stage III (HCC)   Dyslipidemia   Relevant Medications   ezetimibe  (ZETIA ) 10 MG tablet   atorvastatin  (LIPITOR) 80 MG tablet   GERD (gastroesophageal reflux disease)   Relevant Medications   omeprazole  (PRILOSEC) 40 MG capsule    Major depression in remission   Relevant Medications   sertraline  (ZOLOFT ) 50 MG tablet   Pulmonary emphysema (HCC)   Type 2 diabetes mellitus with diabetic nephropathy (HCC)   Relevant Medications   losartan  (COZAAR ) 25 MG tablet   atorvastatin  (LIPITOR) 80 MG tablet   Other Visit Diagnoses       Annual physical exam    -  Primary     Flu vaccine need       Relevant Orders   Flu vaccine HIGH DOSE PF(Fluzone Trivalent) (Completed)        Updated Health Maintenance information Reviewed recent lab results with patient Encouraged improvement to lifestyle with diet and exercise Goal of weight loss  Adult Wellness Visit Annual wellness visit conducted. Immunizations up to date. Blood work shows normal liver function, stable iron count, improved A1c, and well-controlled cholesterol and blood pressure. - Continue current medications for cholesterol and blood pressure. - Encouraged increased water intake. - Scheduled follow-up appointment in May.  Stage 3b chronic kidney disease Gradual decline in kidney function. Over past 4 years eGFR upper 40s to low 40s then down to 36, Creatinine peak 1.90 Discussed potential need for medication adjustment or additional testing. Emphasized importance of hydration. Discussed potential use of Jardiance, noting risk of urinary tract infections. - Referred to nephrology for further evaluation. - Encouraged increased water intake.  Type 2 diabetes mellitus with diabetic nephropathy Complications with Diabetic Nephropathy CKD III b, Vascular Disease Coronary Artery Disease A1c improved, indicating better glycemic control. No current diabetic medications.  Discussed potential future use of Jardiance, noting risk of urinary tract infections. - Continue monitoring A1c levels. - Not on therapy - Will discuss potential use of  Jardiance with nephrology if recommended.  Major Depression in remission On SSRI Sertraline   Benign prostatic hyperplasia  with lower urinary tract symptoms Enlarged prostate with urinary frequency. Previous intolerance to Flomax  due to dizziness. - Pursuing Nephrology eval first. But we discussed he may be experiencing persistent BPH LUTS obstructive nephropathy concerns this can be pursued next following Renal work up / likely Renal US  imaging  - Will consider referral to urology if nephrology recommends.  Coronary Artery Disease Dyslipidemia Cholesterol levels well controlled with atorvastatin  and Zetia . LDL at 53, indicating effective management. - Continue current lipid-lowering therapy.      Orders Placed This Encounter  Procedures   Flu vaccine HIGH DOSE PF(Fluzone Trivalent)    Meds ordered this encounter  Medications   sertraline  (ZOLOFT ) 50 MG tablet    Sig: Take 1 tablet (50 mg total) by mouth daily.    Dispense:  90 tablet    Refill:  3   omeprazole  (PRILOSEC) 40 MG capsule    Sig: Take 1 capsule (40 mg total) by mouth daily.    Dispense:  90 capsule    Refill:  3   metoprolol  tartrate (LOPRESSOR ) 25 MG tablet    Sig: Take 1 tablet (25 mg total) by mouth 2 (two) times daily.    Dispense:  180 tablet    Refill:  3   losartan  (COZAAR ) 25 MG tablet    Sig: Take 1 tablet (25 mg total) by mouth daily.    Dispense:  90 tablet    Refill:  3   ezetimibe  (ZETIA ) 10 MG tablet    Sig: Take 1 tablet (10 mg total) by mouth daily.    Dispense:  90 tablet    Refill:  3   atorvastatin  (LIPITOR) 80 MG tablet    Sig: Take 1 tablet (80 mg total) by mouth at bedtime.    Dispense:  90 tablet    Refill:  3     Follow up plan: Return in about 5 months (around 03/30/2025) for 5 month Follow-up DM A1c, HTN, CKD Nephro.  Marsa Officer, DO [facility] 10/30/2024, 9:34 AM

## 2024-10-30 NOTE — Patient Instructions (Addendum)
 Thank you for coming to the office today.  Referral to Kidney Specialist.  Eventually we may need the Urologist for the Prostate, but for now we will just work with the Kidney doctor.  [facility] [address] [place], [place] [address] Phone: [phone]   Improve water intake, drink more water  Labs look great  Cholesteorl controled  Recent Labs    03/06/24 0813 10/23/24 0816  HGBA1C 6.9* 6.5*   Refilled medications.  Please schedule a Follow-up Appointment to: Return in about 5 months (around 03/30/2025) for 5 month Follow-up DM A1c, HTN, CKD Nephro.  If you have any other questions or concerns, please feel free to call the office or send a message through MyChart. You may also schedule an earlier appointment if necessary.  Additionally, you may be receiving a survey about your experience at our office within a few days to 1 week by e-mail or mail. We value your feedback.  Marsa Officer, DO [facility], [place]

## 2024-10-31 ENCOUNTER — Other Ambulatory Visit: Payer: Self-pay | Admitting: Family Medicine

## 2024-10-31 DIAGNOSIS — I129 Hypertensive chronic kidney disease with stage 1 through stage 4 chronic kidney disease, or unspecified chronic kidney disease: Secondary | ICD-10-CM

## 2024-10-31 DIAGNOSIS — N1832 Chronic kidney disease, stage 3b: Secondary | ICD-10-CM

## 2024-10-31 DIAGNOSIS — E1121 Type 2 diabetes mellitus with diabetic nephropathy: Secondary | ICD-10-CM

## 2024-12-16 ENCOUNTER — Other Ambulatory Visit: Payer: Self-pay | Admitting: Nephrology

## 2024-12-16 DIAGNOSIS — R3129 Other microscopic hematuria: Secondary | ICD-10-CM

## 2024-12-16 DIAGNOSIS — N1832 Chronic kidney disease, stage 3b: Secondary | ICD-10-CM

## 2024-12-16 DIAGNOSIS — I129 Hypertensive chronic kidney disease with stage 1 through stage 4 chronic kidney disease, or unspecified chronic kidney disease: Secondary | ICD-10-CM

## 2024-12-16 DIAGNOSIS — R399 Unspecified symptoms and signs involving the genitourinary system: Secondary | ICD-10-CM

## 2024-12-16 DIAGNOSIS — R8290 Unspecified abnormal findings in urine: Secondary | ICD-10-CM

## 2024-12-22 ENCOUNTER — Ambulatory Visit

## 2024-12-23 ENCOUNTER — Ambulatory Visit: Admitting: Family Medicine

## 2024-12-23 ENCOUNTER — Ambulatory Visit: Payer: Self-pay

## 2024-12-23 ENCOUNTER — Encounter: Payer: Self-pay | Admitting: Family Medicine

## 2024-12-23 VITALS — BP 138/80 | HR 68 | Ht 71.0 in | Wt 220.5 lb

## 2024-12-23 DIAGNOSIS — N390 Urinary tract infection, site not specified: Secondary | ICD-10-CM

## 2024-12-23 DIAGNOSIS — R3129 Other microscopic hematuria: Secondary | ICD-10-CM

## 2024-12-23 DIAGNOSIS — R300 Dysuria: Secondary | ICD-10-CM

## 2024-12-23 DIAGNOSIS — R339 Retention of urine, unspecified: Secondary | ICD-10-CM

## 2024-12-23 DIAGNOSIS — N1832 Chronic kidney disease, stage 3b: Secondary | ICD-10-CM

## 2024-12-23 DIAGNOSIS — N138 Other obstructive and reflux uropathy: Secondary | ICD-10-CM

## 2024-12-23 DIAGNOSIS — E1121 Type 2 diabetes mellitus with diabetic nephropathy: Secondary | ICD-10-CM

## 2024-12-23 DIAGNOSIS — T3695XA Adverse effect of unspecified systemic antibiotic, initial encounter: Secondary | ICD-10-CM

## 2024-12-23 LAB — POCT URINALYSIS DIPSTICK
Bilirubin, UA: NEGATIVE
Glucose, UA: NEGATIVE
Ketones, UA: NEGATIVE
Nitrite, UA: NEGATIVE
Odor: POSITIVE
Protein, UA: POSITIVE — AB
Spec Grav, UA: 1.015
Urobilinogen, UA: 0.2 U/dL
pH, UA: 7

## 2024-12-23 MED ORDER — FLUCONAZOLE 150 MG PO TABS: ORAL_TABLET | ORAL | 0 refills | Status: AC

## 2024-12-23 MED ORDER — SULFAMETHOXAZOLE-TRIMETHOPRIM 800-160 MG PO TABS: 1.0000 | ORAL_TABLET | Freq: Two times a day (BID) | ORAL | 0 refills | Status: AC

## 2024-12-23 NOTE — Patient Instructions (Addendum)
 Thank you for coming to the office today.  Referral to Urologist. They will call to schedule.  They can evaluate the blood in urine and the recurrent urine infections.  [facility] -1st floor [address] [place],  [place]  [address] Phone: [phone]  Trial on Antibiotic Bactrim  for 7 day course to cover for infection, however this may be caused by inflammation and blood in urine not from infection.  Discuss with Urologist further.  Please schedule a Follow-up Appointment to: Return if symptoms worsen or fail to improve.  If you have any other questions or concerns, please feel free to call the office or send a message through MyChart. You may also schedule an earlier appointment if necessary.  Additionally, you may be receiving a survey about your experience at our office within a few days to 1 week by e-mail or mail. We value your feedback.  Marsa Officer, DO [facility], [place]

## 2024-12-23 NOTE — Progress Notes (Signed)
 "  Subjective:    Patient ID: Elsie LULLA Mayer, male    DOB: [birth year], 79 y.o.   MRN: [id number]  MEGHAN TIEMANN is a 79 y.o. male presenting on 12/23/2024 for Dysuria (About 3 days )   HPI  Discussed the use of AI scribe software for clinical note transcription with the patient, who gave verbal consent to proceed.  History of Present Illness   SAMAD THON is a 79 year old male with recurrent urinary tract infections who presents with worsening urinary symptoms.  Urinary symptoms Recurrent UTI Dysuria Incomplete emptying / urinary retention - Suspected BPH LUTS - Worsening dysuria since February 1st, persistent without improvement - Frequent urination, occurring every ten minutes - Burning pain from the perineal area to the tip of the penis - Microscopic Hematuria during previous bladder infections in past and present on recent Urinalysis now - Drinks a lot of water - No use of Azo for burning due to concern for kidney damage - Recently treated by Nephrologist with Cipro  250mg  TWICE A DAY x 5 days without resolution of symptoms, in past also tried Keflex  500 THREE TIMES A DAY for 7 days with resolution but return of symptoms after antibiotics - Previous urine cultures showed mixed bacteria without definitive identification  Suspected BPH LUTS Recent Alpha-blocker therapy - Currently taking Flomax , which improves urination - Experiences dizziness as a side effect of Flomax   Does not have Urologist, referred by Nephrologist but pending  Type 2 Diabetes with nephropathy CKD-IIIb Chronic problem Has been diet controlled diabetic, not on medication currently  Previously controlled Followed by Nephrology recently evaluated      10/31/2024   12:52 AM 04/24/2024    4:00 PM 03/13/2024    9:31 AM  Depression screen PHQ 2/9  Decreased Interest 0 0 0  Down, Depressed, Hopeless 0 0 0  PHQ - 2 Score 0 0 0  Altered sleeping 0 0 0  Tired, decreased energy 0 0 0  Change in  appetite 0 0 0  Feeling bad or failure about yourself  0 0 0  Trouble concentrating 0 0 0  Moving slowly or fidgety/restless 0 0 0  Suicidal thoughts 0 0 0  PHQ-9 Score 0 0  0   Difficult doing work/chores Not difficult at all Not difficult at all Not difficult at all     Data saved with a previous flowsheet row definition       03/13/2024    9:31 AM 10/15/2023    1:40 PM 04/05/2023    9:35 AM 10/05/2022    9:10 AM  GAD 7 : Generalized Anxiety Score  Nervous, Anxious, on Edge 0  0  0  0   Control/stop worrying 0  0  0  0   Worry too much - different things 0  0  0  0   Trouble relaxing 0  0  0  0   Restless 0  0  0  0   Easily annoyed or irritable 0  0  0  0   Afraid - awful might happen 0  0  0  0   Total GAD 7 Score 0 0 0 0  Anxiety Difficulty Not difficult at all Not difficult at all  Not difficult at all     Data saved with a previous flowsheet row definition    Social History[1]  Review of Systems Per HPI unless specifically indicated above     Objective:    BP 138/80 (  BP Location: Left Arm, Cuff Size: Normal)   Pulse 68   Ht 5' 11 (1.803 m)   Wt 220 lb 8 oz (100 kg)   SpO2 98%   BMI 30.75 kg/m   Wt Readings from Last 3 Encounters:  12/23/24 220 lb 8 oz (100 kg)  10/30/24 223 lb 2 oz (101.2 kg)  10/06/24 230 lb (104.3 kg)    Physical Exam Vitals and nursing note reviewed.  Constitutional:      General: He is not in acute distress.    Appearance: Normal appearance. He is well-developed. He is not diaphoretic.     Comments: Well-appearing, comfortable, cooperative  HENT:     Head: Normocephalic and atraumatic.  Eyes:     General:        Right eye: No discharge.        Left eye: No discharge.     Conjunctiva/sclera: Conjunctivae normal.  Cardiovascular:     Rate and Rhythm: Normal rate.  Pulmonary:     Effort: Pulmonary effort is normal.  Skin:    General: Skin is warm and dry.     Findings: No erythema or rash.  Neurological:     Mental  Status: He is alert and oriented to person, place, and time.  Psychiatric:        Mood and Affect: Mood normal.        Behavior: Behavior normal.        Thought Content: Thought content normal.     Comments: Well groomed, good eye contact, normal speech and thoughts     Results for orders placed or performed in visit on 12/23/24  POCT Urinalysis Dipstick   Collection Time: 12/23/24 11:13 AM  Result Value Ref Range   Color, UA orange    Clarity, UA cloudy    Glucose, UA Negative Negative   Bilirubin, UA negative    Ketones, UA negative    Spec Grav, UA 1.015 1.010 - 1.025   Blood, UA large    pH, UA 7.0 5.0 - 8.0   Protein, UA Positive (A) Negative   Urobilinogen, UA 0.2 0.2 or 1.0 E.U./dL   Nitrite, UA negative    Leukocytes, UA Large (3+) (A) Negative   Appearance     Odor positive     -------------------------------------------------------------------  Urinalysis, Complete w/reflex to Culture Order: [id number] Component Ref Range & Units 2 wk ago  Color, Urine YELLOW DARK YELLOW  Appearance Urine CLEAR TURBID Abnormal   Specific Gravity, UA 1.001 - 1.035 1.018  pH Urine 5.0 - 8.0 6.0  Glucose, Ur NEGATIVE NEGATIVE  Bilirubin, Urine NEGATIVE NEGATIVE  Ketones, Urine NEGATIVE NEGATIVE  Hemoglobin Ur Ql Strip NEGATIVE 3+ Abnormal   Protein, Ur NEGATIVE 3+ Abnormal   Nitrite, Urine NEGATIVE NEGATIVE  WBC Esterase Urine NEGATIVE 3+ Abnormal   WBC, Urine < OR = 5 /HPF PACKED Abnormal   RBC, Urine < OR = 2 /HPF > OR = 60 Abnormal   Epithelial Cells in Urine < OR = 5 /HPF 40-60 Abnormal   Bacteria NONE SEEN /HPF FEW Abnormal   Hyaline Casts, Urine NONE SEEN /LPF 6-10 Abnormal   Yeast, UA NONE SEEN /HPF FEW Abnormal   Note:   Comment:      This urine was analyzed for the presence of WBC,      RBC, bacteria, casts, and other formed elements.      Only those elements seen were reported.  Resulting Agency [facility]    Specimen Collected: 12/09/24 09:58   Performed by: ORLIN SPIEGEL Last Resulted: 12/11/24 08:36  Received From: [facility]  Result Received: 12/16/24 14:57   ------------------------------  tains abnormal data Renal Function Panel Order: [id number] Component Ref Range & Units 2 wk ago  Glucose 65 - 99 mg/dL 891 High   Comment:                                                                                             Fasting reference interval                                               For someone without known diabetes, a glucose value      between 100 and 125 mg/dL is consistent with      prediabetes and should be confirmed with a      follow-up test.         BUN 7 - 25 mg/dL 29 High   Creatinine 9.29 - 1.28 mg/dL 8.02 High   eGFR CKD-EPI CR 2021 > OR = 60 mL/min/1.62m2 35 Low   BUN/Creatinine Ratio 6 - 22 (calc) 15  Sodium 135 - 146 mmol/L 143  Potassium 3.5 - 5.3 mmol/L 4.7  Chloride 98 - 110 mmol/L 110  Comment:                                         Verified by repeat analysis.                                           Bicarbonate (CO2) 20 - 32 mmol/L 27  Calcium  8.6 - 10.3 mg/dL 8.7  Phosphorus 2.1 - 4.3 mg/dL 3.8  Albumin 3.6 - 5.1 g/dL 3.9  Resulting Agency [facility]   Specimen Collected: 12/09/24 09:58   Performed by: ORLIN SPIEGEL Last Resulted: 12/11/24 08:36  Received From: [facility]  Result Received: 12/16/24 14:57        Assessment & Plan:   Problem List Items Addressed This Visit     BPH with obstruction/lower urinary tract symptoms   Relevant Medications   tamsulosin  (FLOMAX ) 0.4 MG CAPS capsule   Other Relevant Orders   Ambulatory referral to Urology   Stage 3b chronic kidney disease (HCC)   Type 2 diabetes mellitus with diabetic nephropathy (HCC)   Other Visit Diagnoses       Recurrent UTI    -  Primary   Relevant Medications   sulfamethoxazole -trimethoprim  (BACTRIM  DS) 800-160 MG tablet    fluconazole  (DIFLUCAN ) 150 MG tablet   Other Relevant Orders   Ambulatory referral to Urology     Dysuria       Relevant Orders   Urine Culture   POCT Urinalysis Dipstick (Completed)  Ambulatory referral to Urology     Microscopic hematuria       Relevant Orders   Ambulatory referral to Urology     Urinary retention       Relevant Orders   Ambulatory referral to Urology     Antibiotic-induced yeast infection       Relevant Medications   sulfamethoxazole -trimethoprim  (BACTRIM  DS) 800-160 MG tablet   fluconazole  (DIFLUCAN ) 150 MG tablet        Recurrent urinary tract infection Persistent dysuria and hematuria with mixed bacterial culture recently Has had recurrent UTI since last year, seems temporary resolution on antibiotics - past course on Keflex  success but then return of symptoms dysuria Differential includes bacterial infection and possible inflammatory process. Referral to urologist pending already will update referral information today.  - Prescribed Bactrim  (trimethoprim -sulfamethoxazole ) twice daily for 7 days. Has not been on bactrim  before, recent Cipro  250 x 5 day course insufficient to resolve his UTI - Repeat urine today urinalysis grossly positive, microscopic large blood large leuks - Referred to urologist for further evaluation - recurrent UTI and likely BPH LUTS as well as microscopic hematuria work up. - Advised to avoid Azo due to potential kidney damage risks  Benign prostatic hyperplasia with lower urinary tract symptoms Frequent urination possibly related to bladder emptying issues. Dizziness noted with Flomax . On Tamsulosin  Flomax  still per Nephrology Further eval by Urology  Type 2 diabetes mellitus with diabetic nephropathy CKD IIIb complication Previously A1c controlled Not focus of visit today Reviewed notes from Nephrology - we discussed that I would advise AGAINST starting Jardiance or Farxiga SGLT2 due to his recurrent UTI risk - Diet controlled  with control on A1c - Not candidate for Metformin due to CKD  CKD-IIIb At risk of progression to CKD IV Recent lab with further reduction of eGFR < 30 now to 29, prior results stable in CKD-IIIb range Will follow with Nephrology at this time and further monitor or manage this condition Awaiting Urology consult next  Antibiotic-induced candidiasis Previous antibiotic use led to yeast infections. Prophylactic antifungal treatment considered. - Prescribed antifungal medication as a precautionary measure.        Orders Placed This Encounter  Procedures   Urine Culture   Ambulatory referral to Urology    Referral Priority:   Routine    Referral Type:   Consultation    Referral Reason:   Specialty Services Required    Requested Specialty:   Urology    Number of Visits Requested:   1   POCT Urinalysis Dipstick    Meds ordered this encounter  Medications   sulfamethoxazole -trimethoprim  (BACTRIM  DS) 800-160 MG tablet    Sig: Take 1 tablet by mouth 2 (two) times daily for 7 days.    Dispense:  14 tablet    Refill:  0   fluconazole  (DIFLUCAN ) 150 MG tablet    Sig: Take one tablet by mouth on Day 1. Repeat dose 2nd tablet on Day 3.    Dispense:  2 tablet    Refill:  0    Follow up plan: Return if symptoms worsen or fail to improve.   Marsa Officer, DO [facility] 12/23/2024, 11:32 AM     [1]  Social History Tobacco Use   Smoking status: Former    Current packs/day: 0.00    Average packs/day: 2.0 packs/day    Types: Cigarettes    Quit date: 10/19/2014    Years since quitting: 10.1   Smokeless  tobacco: Former  Building Services Engineer status: Never Used  Substance Use Topics   Alcohol use: No   Drug use: No   "

## 2024-12-23 NOTE — Telephone Encounter (Signed)
 FYI Only or Action Required?: FYI only for provider: appointment scheduled on 12/23/24.  Patient was last seen in primary care on 10/30/2024 by Edman Marsa PARAS, DO.  Called Nurse Triage reporting burning with urination.  Symptoms began 12/20/24.  Interventions attempted: Nothing.  Symptoms are: gradually worsening.  Triage Disposition: See Physician Within 24 Hours  Patient/caregiver understands and will follow disposition?: yes     Message from [place] R sent at 12/23/2024  9:27 AM EST  Red word: Burning during urination. Pt was told he had a bladder infection about 2 weeks ago and also had blood in his urine. He completed medication for it and is now experiencing the burning again.Wife is not sure if the pt has the blood in his urine again.  Pt is scheduled for a CT scan and sees a Insurance Underwriter and nephrologist. Wife is unaware of any chronic conditions of his kidneys.   Reason for Disposition  All other males with painful urination  Answer Assessment - Initial Assessment Questions 1. SEVERITY: How bad is the pain?  (e.g., Scale 1-10; mild, moderate, or severe)     Moderate  2. FREQUENCY: How many times have you had painful urination today?      Several times during the night  3. PATTERN: Is pain present every time you urinate or just sometimes?      Pt is asleep  4. ONSET: When did the painful urination start?      12/20/24 5. FEVER: Do you have a fever? If Yes, ask: What is your temperature, how was it measured, and when did it start?     no 6. PAST UTI: Have you had a urine infection before? If Yes, ask: When was the last time? and What happened that time?      Yes  7. CAUSE: What do you think is causing the painful urination?      Recent bladder infection  8. OTHER SYMPTOMS: Do you have any other symptoms? (e.g., flank pain, penis discharge, scrotal pain, blood in urine)     C/o pain going to buttock and testicle  Protocols used: Urination Pain -  Male-A-AH

## 2024-12-24 LAB — URINE CULTURE
MICRO NUMBER:: 17550490
SPECIMEN QUALITY:: ADEQUATE

## 2024-12-25 ENCOUNTER — Ambulatory Visit: Payer: Self-pay | Admitting: Family Medicine

## 2025-01-05 ENCOUNTER — Ambulatory Visit

## 2025-01-13 ENCOUNTER — Ambulatory Visit: Admitting: Urology

## 2025-04-02 ENCOUNTER — Ambulatory Visit: Admitting: Family Medicine

## 2025-05-07 ENCOUNTER — Ambulatory Visit

## 2025-05-12 ENCOUNTER — Ambulatory Visit
